# Patient Record
Sex: Female | Born: 1945
Health system: Southern US, Community
[De-identification: ages and names within clinical notes are randomized; demographics above are authoritative.]

## PROBLEM LIST (undated history)

## (undated) DIAGNOSIS — Z923 Personal history of irradiation: Secondary | ICD-10-CM

## (undated) DIAGNOSIS — N879 Dysplasia of cervix uteri, unspecified: Secondary | ICD-10-CM

## (undated) DIAGNOSIS — M1990 Unspecified osteoarthritis, unspecified site: Secondary | ICD-10-CM

## (undated) DIAGNOSIS — C50919 Malignant neoplasm of unspecified site of unspecified female breast: Secondary | ICD-10-CM

## (undated) DIAGNOSIS — F419 Anxiety disorder, unspecified: Secondary | ICD-10-CM

## (undated) DIAGNOSIS — H269 Unspecified cataract: Secondary | ICD-10-CM

## (undated) DIAGNOSIS — Z800 Family history of malignant neoplasm of digestive organs: Secondary | ICD-10-CM

## (undated) DIAGNOSIS — N952 Postmenopausal atrophic vaginitis: Secondary | ICD-10-CM

## (undated) DIAGNOSIS — Z8042 Family history of malignant neoplasm of prostate: Secondary | ICD-10-CM

## (undated) DIAGNOSIS — F32A Depression, unspecified: Secondary | ICD-10-CM

## (undated) DIAGNOSIS — I493 Ventricular premature depolarization: Secondary | ICD-10-CM

## (undated) DIAGNOSIS — Z8489 Family history of other specified conditions: Secondary | ICD-10-CM

## (undated) DIAGNOSIS — F329 Major depressive disorder, single episode, unspecified: Secondary | ICD-10-CM

## (undated) DIAGNOSIS — E7800 Pure hypercholesterolemia, unspecified: Secondary | ICD-10-CM

## (undated) DIAGNOSIS — I499 Cardiac arrhythmia, unspecified: Secondary | ICD-10-CM

## (undated) DIAGNOSIS — C4490 Unspecified malignant neoplasm of skin, unspecified: Secondary | ICD-10-CM

## (undated) DIAGNOSIS — D0510 Intraductal carcinoma in situ of unspecified breast: Secondary | ICD-10-CM

## (undated) DIAGNOSIS — G4700 Insomnia, unspecified: Secondary | ICD-10-CM

## (undated) DIAGNOSIS — Z803 Family history of malignant neoplasm of breast: Secondary | ICD-10-CM

## (undated) DIAGNOSIS — M8580 Other specified disorders of bone density and structure, unspecified site: Secondary | ICD-10-CM

## (undated) DIAGNOSIS — J302 Other seasonal allergic rhinitis: Secondary | ICD-10-CM

## (undated) HISTORY — DX: Family history of malignant neoplasm of breast: Z80.3

## (undated) HISTORY — DX: Intraductal carcinoma in situ of unspecified breast: D05.10

## (undated) HISTORY — DX: Other specified disorders of bone density and structure, unspecified site: M85.80

## (undated) HISTORY — DX: Dysplasia of cervix uteri, unspecified: N87.9

## (undated) HISTORY — DX: Family history of malignant neoplasm of prostate: Z80.42

## (undated) HISTORY — PX: COLONOSCOPY: SHX174

## (undated) HISTORY — DX: Personal history of irradiation: Z92.3

## (undated) HISTORY — PX: BREAST EXCISIONAL BIOPSY: SUR124

## (undated) HISTORY — PX: CATARACT EXTRACTION: SUR2

## (undated) HISTORY — DX: Family history of malignant neoplasm of digestive organs: Z80.0

## (undated) HISTORY — DX: Unspecified cataract: H26.9

## (undated) HISTORY — PX: COLPOSCOPY: SHX161

## (undated) HISTORY — PX: FACIAL COSMETIC SURGERY: SHX629

## (undated) HISTORY — PX: GYNECOLOGIC CRYOSURGERY: SHX857

## (undated) HISTORY — DX: Postmenopausal atrophic vaginitis: N95.2

## (undated) HISTORY — DX: Pure hypercholesterolemia, unspecified: E78.00

---
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
## 1973-11-24 DIAGNOSIS — N879 Dysplasia of cervix uteri, unspecified: Secondary | ICD-10-CM

## 1973-11-24 HISTORY — DX: Dysplasia of cervix uteri, unspecified: N87.9

## 1981-11-24 HISTORY — PX: BREAST LUMPECTOMY: SHX2

## 1998-04-17 ENCOUNTER — Other Ambulatory Visit: Admission: RE | Admit: 1998-04-17 | Discharge: 1998-04-17 | Payer: Self-pay | Admitting: Obstetrics and Gynecology

## 1999-06-07 ENCOUNTER — Other Ambulatory Visit: Admission: RE | Admit: 1999-06-07 | Discharge: 1999-06-07 | Payer: Self-pay | Admitting: Obstetrics and Gynecology

## 2000-07-29 ENCOUNTER — Other Ambulatory Visit: Admission: RE | Admit: 2000-07-29 | Discharge: 2000-07-29 | Payer: Self-pay | Admitting: Obstetrics and Gynecology

## 2001-08-10 ENCOUNTER — Other Ambulatory Visit: Admission: RE | Admit: 2001-08-10 | Discharge: 2001-08-10 | Payer: Self-pay | Admitting: Obstetrics and Gynecology

## 2002-08-10 ENCOUNTER — Other Ambulatory Visit: Admission: RE | Admit: 2002-08-10 | Discharge: 2002-08-10 | Payer: Self-pay | Admitting: Obstetrics and Gynecology

## 2003-08-14 ENCOUNTER — Other Ambulatory Visit: Admission: RE | Admit: 2003-08-14 | Discharge: 2003-08-14 | Payer: Self-pay | Admitting: Obstetrics and Gynecology

## 2003-11-25 HISTORY — PX: OTHER SURGICAL HISTORY: SHX169

## 2004-03-11 ENCOUNTER — Encounter: Admission: RE | Admit: 2004-03-11 | Discharge: 2004-03-11 | Payer: Self-pay | Admitting: Obstetrics and Gynecology

## 2004-08-21 ENCOUNTER — Other Ambulatory Visit: Admission: RE | Admit: 2004-08-21 | Discharge: 2004-08-21 | Payer: Self-pay | Admitting: Obstetrics and Gynecology

## 2004-11-24 DIAGNOSIS — C4490 Unspecified malignant neoplasm of skin, unspecified: Secondary | ICD-10-CM

## 2004-11-24 HISTORY — DX: Unspecified malignant neoplasm of skin, unspecified: C44.90

## 2004-11-24 HISTORY — PX: OTHER SURGICAL HISTORY: SHX169

## 2005-05-07 ENCOUNTER — Encounter: Admission: RE | Admit: 2005-05-07 | Discharge: 2005-05-07 | Payer: Self-pay | Admitting: Obstetrics and Gynecology

## 2005-08-22 ENCOUNTER — Other Ambulatory Visit: Admission: RE | Admit: 2005-08-22 | Discharge: 2005-08-22 | Payer: Self-pay | Admitting: Obstetrics and Gynecology

## 2005-12-17 ENCOUNTER — Ambulatory Visit: Payer: Self-pay | Admitting: Internal Medicine

## 2005-12-30 ENCOUNTER — Encounter (INDEPENDENT_AMBULATORY_CARE_PROVIDER_SITE_OTHER): Payer: Self-pay | Admitting: *Deleted

## 2005-12-30 ENCOUNTER — Ambulatory Visit: Payer: Self-pay | Admitting: Internal Medicine

## 2006-08-13 ENCOUNTER — Encounter: Admission: RE | Admit: 2006-08-13 | Discharge: 2006-08-13 | Payer: Self-pay | Admitting: Obstetrics and Gynecology

## 2006-09-18 ENCOUNTER — Other Ambulatory Visit: Admission: RE | Admit: 2006-09-18 | Discharge: 2006-09-18 | Payer: Self-pay | Admitting: Obstetrics and Gynecology

## 2007-09-20 ENCOUNTER — Other Ambulatory Visit: Admission: RE | Admit: 2007-09-20 | Discharge: 2007-09-20 | Payer: Self-pay | Admitting: Obstetrics and Gynecology

## 2007-11-25 HISTORY — PX: BREAST BIOPSY: SHX20

## 2008-09-26 ENCOUNTER — Encounter: Payer: Self-pay | Admitting: Obstetrics and Gynecology

## 2008-09-26 ENCOUNTER — Ambulatory Visit: Payer: Self-pay | Admitting: Obstetrics and Gynecology

## 2008-09-26 ENCOUNTER — Other Ambulatory Visit: Admission: RE | Admit: 2008-09-26 | Discharge: 2008-09-26 | Payer: Self-pay | Admitting: Obstetrics and Gynecology

## 2008-10-17 ENCOUNTER — Ambulatory Visit: Payer: Self-pay | Admitting: Obstetrics and Gynecology

## 2008-11-24 HISTORY — PX: BREAST BIOPSY: SHX20

## 2008-12-13 ENCOUNTER — Encounter: Admission: RE | Admit: 2008-12-13 | Discharge: 2008-12-13 | Payer: Self-pay | Admitting: Obstetrics and Gynecology

## 2009-01-15 ENCOUNTER — Encounter: Admission: RE | Admit: 2009-01-15 | Discharge: 2009-01-15 | Payer: Self-pay | Admitting: Obstetrics and Gynecology

## 2009-01-18 ENCOUNTER — Encounter: Admission: RE | Admit: 2009-01-18 | Discharge: 2009-01-18 | Payer: Self-pay | Admitting: Obstetrics and Gynecology

## 2009-01-26 ENCOUNTER — Encounter: Admission: RE | Admit: 2009-01-26 | Discharge: 2009-01-26 | Payer: Self-pay | Admitting: Obstetrics and Gynecology

## 2009-01-26 ENCOUNTER — Encounter: Payer: Self-pay | Admitting: Obstetrics and Gynecology

## 2009-09-03 ENCOUNTER — Telehealth: Payer: Self-pay | Admitting: Internal Medicine

## 2009-09-06 DIAGNOSIS — Z860100 Personal history of colon polyps, unspecified: Secondary | ICD-10-CM | POA: Insufficient documentation

## 2009-09-06 DIAGNOSIS — K6289 Other specified diseases of anus and rectum: Secondary | ICD-10-CM | POA: Insufficient documentation

## 2009-09-06 DIAGNOSIS — K625 Hemorrhage of anus and rectum: Secondary | ICD-10-CM | POA: Insufficient documentation

## 2009-09-06 DIAGNOSIS — K5730 Diverticulosis of large intestine without perforation or abscess without bleeding: Secondary | ICD-10-CM | POA: Insufficient documentation

## 2009-09-11 ENCOUNTER — Ambulatory Visit: Payer: Self-pay | Admitting: Internal Medicine

## 2009-09-11 DIAGNOSIS — K602 Anal fissure, unspecified: Secondary | ICD-10-CM | POA: Insufficient documentation

## 2009-09-11 DIAGNOSIS — M129 Arthropathy, unspecified: Secondary | ICD-10-CM | POA: Insufficient documentation

## 2009-09-11 DIAGNOSIS — F411 Generalized anxiety disorder: Secondary | ICD-10-CM | POA: Insufficient documentation

## 2009-09-13 ENCOUNTER — Ambulatory Visit: Payer: Self-pay | Admitting: Internal Medicine

## 2009-10-08 ENCOUNTER — Other Ambulatory Visit: Admission: RE | Admit: 2009-10-08 | Discharge: 2009-10-08 | Payer: Self-pay | Admitting: Obstetrics and Gynecology

## 2009-10-08 ENCOUNTER — Ambulatory Visit: Payer: Self-pay | Admitting: Obstetrics and Gynecology

## 2009-10-08 ENCOUNTER — Encounter: Payer: Self-pay | Admitting: Obstetrics and Gynecology

## 2009-12-14 ENCOUNTER — Encounter: Admission: RE | Admit: 2009-12-14 | Discharge: 2009-12-14 | Payer: Self-pay | Admitting: Obstetrics and Gynecology

## 2009-12-25 ENCOUNTER — Telehealth (INDEPENDENT_AMBULATORY_CARE_PROVIDER_SITE_OTHER): Payer: Self-pay | Admitting: *Deleted

## 2010-01-17 ENCOUNTER — Telehealth (INDEPENDENT_AMBULATORY_CARE_PROVIDER_SITE_OTHER): Payer: Self-pay | Admitting: *Deleted

## 2010-10-31 ENCOUNTER — Ambulatory Visit: Payer: Self-pay | Admitting: Obstetrics and Gynecology

## 2010-11-05 ENCOUNTER — Other Ambulatory Visit
Admission: RE | Admit: 2010-11-05 | Discharge: 2010-11-05 | Payer: Self-pay | Source: Home / Self Care | Admitting: Obstetrics and Gynecology

## 2010-11-05 ENCOUNTER — Ambulatory Visit: Payer: Self-pay | Admitting: Obstetrics and Gynecology

## 2010-12-15 ENCOUNTER — Encounter: Payer: Self-pay | Admitting: Obstetrics and Gynecology

## 2010-12-19 ENCOUNTER — Encounter
Admission: RE | Admit: 2010-12-19 | Discharge: 2010-12-19 | Payer: Self-pay | Source: Home / Self Care | Attending: Obstetrics and Gynecology | Admitting: Obstetrics and Gynecology

## 2010-12-19 ENCOUNTER — Other Ambulatory Visit: Payer: Self-pay | Admitting: Obstetrics and Gynecology

## 2010-12-19 DIAGNOSIS — Z803 Family history of malignant neoplasm of breast: Secondary | ICD-10-CM

## 2010-12-24 NOTE — Assessment & Plan Note (Signed)
Summary: CHANGE IN BOWEL HABITS,OCC.RECTAL PAIN,OCC.RECTAL BLEEDING   ...   History of Present Illness Visit Type: new patient  Primary GI MD: Linda Sar MD Primary Provider: Donnie Mesa, MD  Requesting Provider: n/a Chief Complaint: Change in bowel habits, constipation, hemorrhoids, rectal pain, and BRB when pt wipes after BMs  History of Present Illness:   Linda [name] is a 65 year old female whom we have seen in the past for hematochezia and abdominal pain. A colonoscopy done in 2007 showed diverticulosis and a hyperplastic colonic polyp. Patient does have a family history of colon cancer in a grandparent. She comes today complaining of changes in bowel habits to being more constipated. She also complains of some rectal pain and occasional rectal bleeding. She has not changed any of her medications or her level of activity. Patient walks 3 times a week and stays very active. She is on Metamucil twice a day. She has been taking MiraLax on occasion as well.   GI Review of Systems      Denies abdominal pain, acid reflux, belching, bloating, chest pain, dysphagia with liquids, dysphagia with solids, heartburn, loss of appetite, nausea, vomiting, vomiting blood, weight loss, and  weight gain.      Reports change in bowel habits, constipation, hemorrhoids, irritable bowel syndrome, rectal bleeding, and  rectal pain.     Denies anal fissure, black tarry stools, diarrhea, diverticulosis, fecal incontinence, heme positive stool, jaundice, light color stool, and  liver problems. c   Current Medications (verified): 1)  Cipro 500 Mg Tabs (Ciprofloxacin Hcl) .... One Tablet By Mouth Two Times A Day For One Week 2)  Zocor 20 Mg Tabs (Simvastatin) .... One Tablet By Mouth Once Daily 3)  Evista 60 Mg Tabs (Raloxifene Hcl) .... One Tablet By Mouth Once Daily 4)  Ambien 5 Mg Tabs (Zolpidem Tartrate) .... One Tablet By Mouth Once Daily 5)  Vagifem 25 Mcg Tabs (Estradiol) .... Two Times A Week 6)   Alprazolam 0.25 Mg Tabs (Alprazolam) .... One Tablet By Mouth Two Times Weekly As Needed 7)  Aspir-Low 81 Mg Tbec (Aspirin) .... One Tablet By Mouth Once Daily 8)  Viactiv Multi-Vitamin  Chew (Multiple Vitamins-Calcium) .... Two Tablet By Mouth Daily 9)  Citrucel  Powd (Methylcellulose (Laxative)) .... Two Tablespoons Daily 10)  Fish Oil 1000 Mg Caps (Omega-3 Fatty Acids) .... One Tablet By Mouth Once Daily 11)  Vitamin C 1000 Mg Tabs (Ascorbic Acid) .... One Tablet By Mouth Once Daily 12)  Flax Seed Oil 1000 Mg Caps (Flaxseed (Linseed)) .... One Tablet By Mouth Once Daily  Allergies (verified): No Known Drug Allergies  Past History:  Past Medical History: Current Problems:  ANXIETY (ICD-300.00) ANAL FISSURE (ICD-565.0) ARTHRITIS (ICD-716.90) COLONIC POLYPS, HYPERPLASTIC, HX OF (ICD-V12.72) DIVERTICULOSIS, COLON (ICD-562.10) Family Hx of COLON CANCER (ICD-153.9) RECTAL BLEEDING (ICD-569.3) RECTAL PAIN (XBM-841.32)      Past Surgical History: Reviewed history from 09/06/2009 and no changes required. Appendectomy  Family History: Family History of Colon Cancer: ? Paternal Grandfather Family History of Diabetes: Son Family History of Breast Cancer: Mother and Sister   Social History: Illicit Drug Use - no Occupation: Retired  Married 4 childern Patient has never smoked.  Alcohol Use - yes: one daily  Daily Caffeine Use: one daily  Smoking Status:  never  Review of Systems  The patient denies allergy/sinus, anemia, anxiety-new, arthritis/joint pain, back pain, blood in urine, breast changes/lumps, change in vision, confusion, cough, coughing up blood, depression-new, fainting, fatigue, fever, headaches-new, hearing problems,  heart murmur, heart rhythm changes, itching, menstrual pain, muscle pains/cramps, night sweats, nosebleeds, pregnancy symptoms, shortness of breath, skin rash, sleeping problems, sore throat, swelling of feet/legs, swollen lymph glands, thirst -  excessive , urination - excessive , urination changes/pain, urine leakage, vision changes, and voice change.         Pertinent positive and negative review of systems were noted in the above HPI. All other ROS was otherwise negative.   Vital Signs:  Patient profile:   65 year old female Height:      68 inches Weight:      145 pounds BMI:     22.13 BSA:     1.78 Pulse rate:   68 / minute Pulse rhythm:   regular BP sitting:   110 / 60  (left arm) Cuff size:   regular  Vitals Entered By: Ok Anis CMA (September 11, 2009 8:51 AM)  Physical Exam  General:  Well developed, well nourished, no acute distress. Eyes:  PERRLA, no icterus. Mouth:  No deformity or lesions, dentition normal. Neck:  Supple; no masses or thyromegaly. Lungs:  Clear throughout to auscultation. Heart:  Regular rate and rhythm; no murmurs, rubs,  or bruits. Abdomen:  soft abdomen with normoactive bowel sounds. No scars, no distention, no tenderness Rectal:  rectal and anoscopic exam reveals small first grade internal hemorrhoids without sign of bleeding small external skin tags, somewhat decreased rectal tone, stool is Hemoccult negative Extremities:  No clubbing, cyanosis, edema or deformities noted. Skin:  Intact without significant lesions or rashes.   Impression & Recommendations:  Problem # 1:  RECTAL BLEEDING (ICD-569.3)  Patient has rectal bleeding and discomfort related to small internal hemorrhoids and scar tissue externally from a healed anal fissure. Her stool is Hemoccult negative. There is a family history of colon cancer in a grandparent and the patient is concerned about the possibility of malignancy. We will go ahead with a colonoscopy although the initial recall was planned for 2012.  Orders: Colonoscopy (Colon)  Problem # 2:  COLONIC POLYPS, HYPERPLASTIC, HX OF (ICD-V12.72)  Patient had a hyperplastic polyp on her last colonoscopy.  Orders: Colonoscopy (Colon)  Problem # 3:   DIVERTICULOSIS, COLON (ICD-562.10)  Patient has moderately severe diverticulosis of the left colon which may be causing changes in bowel habits. We will re-evaluate this during her colonoscopy. She is to continue on Metamucil twice a day and a stool softener 1-2 a day. She will also take milk of magnesia 30-45 cc 2-3 times a week. As an alternative, she could use the MiraLax 9-17 g 2-3 times a week.  Orders: Colonoscopy (Colon)  Patient Instructions: 1)  colonoscopy. 2)  Continue physical activity. 3)  Milk of magnesia or MiraLax 2-3 times a week. 4)  Colace 100 mg once or twice a day. 5)  Metamucil one tablsp twice a day. 6)  Copy sent to : Dr Lacretia Nicks.Clelia Croft, Dr D.Gottsegan Prescriptions: DULCOLAX 5 MG  TBEC (BISACODYL) Day before procedure take 2 at 3pm and 2 at 8pm.  #4 x 0   Entered by:   Hortense Ramal CMA (AAMA)   Authorized by:   Hart Carwin MD   Signed by:   Hortense Ramal CMA (AAMA) on 09/11/2009   Method used:   Electronically to        [facility]* (retail)       [address]       [place], [place]  [address]       Ph: [phone]  or [phone]       Fax: [phone]   RxID:   [id number] REGLAN 10 MG  TABS (METOCLOPRAMIDE HCL) As per prep instructions.  #2 x 0   Entered by:   Hortense Ramal CMA (AAMA)   Authorized by:   Hart Carwin MD   Signed by:   Hortense Ramal CMA (AAMA) on 09/11/2009   Method used:   Electronically to        [facility]* (retail)       [address] [place], [place]  [address]       Ph: [phone] or [phone]       Fax: [phone]   RxID:   [id number] MIRALAX   POWD (POLYETHYLENE GLYCOL 3350) As per prep  instructions.  #255gm x 0   Entered by:   Hortense Ramal CMA (AAMA)   Authorized by:   Hart Carwin MD   Signed by:   Hortense Ramal CMA (AAMA) on 09/11/2009   Method used:   Electronically to        [facility]* (retail)       [address]       [place], [place]  [address]       Ph: [phone]  or [phone]       Fax: [phone]   RxID:   [id number]

## 2010-12-24 NOTE — Progress Notes (Signed)
Summary: Records request from [facility]   Request for records received from [facility] Request forwarded to [employer]. Dena Chavis  December 25, 2009 4:22 PM

## 2010-12-24 NOTE — Procedures (Signed)
Summary: COLON   Colonoscopy  Procedure date:  12/30/2005  Findings:      Location:  [facility].    Procedures Next Due Date:    Colonoscopy: 12/2010 Patient Name: Linda, [name] MRN:  Procedure Procedures: Colonoscopy CPT: 01093.    with biopsy. CPT: Q5068410.  Personnel: Endoscopist: Dora L. Juanda Chance, MD.  Exam Location: Exam performed in Outpatient Clinic. Outpatient  Patient Consent: Procedure, Alternatives, Risks and Benefits discussed, consent obtained, from patient. Consent was obtained by the RN.  Indications Symptoms: Hematochezia. Abdominal pain / bloating. Change in bowel habits.  Surveillance of: 2001.  Increased Risk Screening: For family history of colorectal neoplasia, in  grandparent  History  Current Medications: Patient is not currently taking Coumadin.  Pre-Exam Physical: Performed Dec 30, 2005. Entire physical exam was normal.  Comments: Pt. history reviewed/updated, physical exam performed prior to initiation of sedation? Exam Exam: Extent of exam reached: Cecum, extent intended: Cecum.  The cecum was identified by appendiceal orifice and IC valve. Colon retroflexion performed. Images taken. ASA Classification: I. Tolerance: good.  Monitoring: Pulse and BP monitoring, Oximetry used. Supplemental O2 given.  Colon Prep Used Miralax for colon prep. Prep results: good.  Sedation Meds: Patient assessed and found to be appropriate for moderate (conscious) sedation. Fentanyl 100 mcg. given IV. Versed 10 mg. given IV.  Findings - NORMAL EXAM: Cecum.  POLYP: Sigmoid Colon, Maximum size: 3 mm. diminutive, sessile polyp. Distance from Anus 20 cm. Procedure:  biopsy without cautery, removed, retrieved, Polyp sent to pathology. ICD9: Colon Polyps: 211. 3.  - DIVERTICULOSIS: Descending Colon to Sigmoid Colon. ICD9: Diverticulosis: 562.10. Comments: moderately severe.  - NORMAL EXAM: Rectum.   Assessment Abnormal examination, see findings  above.  Diagnoses: 562.10: Diverticulosis.  211.3: Colon Polyps.   Comments: diminutive polyp removed from 20 cm, nothing to account for rectal bleeding which is likely of anal origin Events  Unplanned Interventions: No intervention was required.  Unplanned Events: There were no complications. Plans Medication Plan: Await pathology. Fiber supplements: Psyllium 1 tsp QD, starting Dec 30, 2005  Analpram 2% cream: prn, starting Dec 30, 2005   Patient Education: Patient given standard instructions for: Yearly hemoccult testing recommended. Patient instructed to get routine colonoscopy every 5 years.  Disposition: After procedure patient sent to recovery. After recovery patient sent home.   This report was created from the original endoscopy report, which was reviewed and signed by the above listed endoscopist.     FINAL DIAGNOSIS    ***MICROSCOPIC EXAMINATION AND DIAGNOSIS***    COLON, POLYP(S): HYPERPLASTIC POLYP(S). NO ADENOMATOUS CHANGE   OR MALIGNANCY IDENTIFIED (ENDOSCOPIC BIOPSY, COLON POLYP, 25 CM.    jy   Date Reported: 12/31/2005 Linda L. Almyra Free, MD   *** Electronically Signed Out By ALL ***    Clinical information   Rectal bleeding, abd pain. FX HX colon cancer grandfather. Colon   polyp R/O adenomatous changes (jes)    specimen(s) obtained   Colon, polyp(s) 25 cm    Gross Description   Received in formalin is a tan, soft tissue fragment that is   submitted in toto. Size: 0.4 cm one block   (BJ:jes, 12/31/05)    jes/

## 2010-12-24 NOTE — Miscellaneous (Signed)
Summary: cipro rx  Clinical Lists Changes  Medications: Added new medication of CIPROFLOXACIN HCL 250 MG  TABS (CIPROFLOXACIN HCL) Take 1 twice a day times 10 days - Signed Rx of CIPROFLOXACIN HCL 250 MG  TABS (CIPROFLOXACIN HCL) Take 1 twice a day times 10 days;  #20 x 0;  Signed;  Entered by: Burman Foster RN;  Authorized by: Hart Carwin MD;  Method used: Electronically to [facility] [address] [place], [place]  [address], Ph: [phone] or [phone], Fax: [phone]    Prescriptions: CIPROFLOXACIN HCL 250 MG  TABS (CIPROFLOXACIN HCL) Take 1 twice a day times 10 days  #20 x 0   Entered by:   Burman Foster RN   Authorized by:   Hart Carwin MD   Signed by:   Burman Foster RN on 09/13/2009   Method used:   Electronically to        [facility]* (retail)       [address] [place], [place]  [address]       Ph: [phone] or [phone]       Fax: [phone]   RxID:   [id number]

## 2010-12-24 NOTE — Procedures (Signed)
Summary: Colonoscopy  Patient: [name] Note: All result statuses are Final unless otherwise noted.  Tests: (1) Colonoscopy (COL)   COL Colonoscopy           DONE     [facility]     [address]     [place], [place]  [address]           COLONOSCOPY PROCEDURE REPORT           PATIENT:  Linda [name], Linda [name]  MR#:  [id number]     BIRTHDATE:  March 10, 1946, 63 yrs. old  GENDER:  female           ENDOSCOPIST:  Hedwig Morton. Juanda Chance, MD     Referred by:  Kari Baars, M.D.           PROCEDURE DATE:  09/13/2009     PROCEDURE:  Colonoscopy, Diagnostic     ASA CLASS:  Class I     INDICATIONS:  change in bowel habits, history of hyperplastic     polyps, hematochezia last colon 2007,           MEDICATIONS:   Versed 12 mg, Fentanyl 100 mcg           DESCRIPTION OF PROCEDURE:   After the risks benefits and     alternatives of the procedure were thoroughly explained, informed     consent was obtained.  Digital rectal exam was performed and     revealed no rectal masses.   The LB [id number] endoscope     was introduced through the anus and advanced to the cecum, which     was identified by both the appendix and ileocecal valve, without     limitations.  The quality of the prep was good, using MiraLax.     The instrument was then slowly withdrawn as the colon was fully     examined.     <<PROCEDUREIMAGES>>           FINDINGS:  Moderate diverticulosis was found (see image1, image2,     image7, image8, image10, and image9). narrow tortuous lumen of the     sigmoid colon, whitish purulent appearing material impacted i one     of the diverticuli, r/o diverticulitis  This was otherwise a     normal examination of the colon (see image11, image4, image5, and     image6).   Retroflexed views in the rectum revealed no     abnormalities.    The scope was then withdrawn from the patient     and the procedure completed.           COMPLICATIONS:  None           ENDOSCOPIC IMPRESSION:     1) Moderate diverticulosis     2) Otherwise normal examination     r/o diverticulitis, narrow sigmoid colon     RECOMMENDATIONS:     Cipro 250 mg po bid           REPEAT EXAM:  In 7 year(s) for.           ______________________________     Hedwig Morton. Juanda Chance, MD           CC:           n.     eSIGNED:   Hedwig Morton. Aurilla Coulibaly at 09/13/2009 03:38 PM           Vickii Penna, [phone]  Note: An exclamation mark (!) indicates a  result that was not dispersed into the flowsheet. Document Creation Date: 09/13/2009 3:38 PM _______________________________________________________________________  (1) Order result status: Final Collection or observation date-time: 09/13/2009 15:25 Requested date-time:  Receipt date-time:  Reported date-time:  Referring Physician:   Ordering Physician: Lina Sar [phone]) Specimen Source:  Source: Launa Grill Order Number: [id number] Lab site:   Appended Document: Colonoscopy    Clinical Lists Changes  Observations: Added new observation of COLONNXTDUE: 08/2016 (09/13/2009 15:52)

## 2010-12-24 NOTE — Letter (Signed)
Summary: [facility] Instructions  [facility]  [address] [place], [place] [address]   Phone: [phone]  Fax: [phone]         Linda [name]      February 09, 1946      MRN: [id number]        Procedure Day /Date: 09/13/09 (Thursday)     Arrival Time: 1:30 pm     Procedure Time: 2:30 pm     Location of Procedure:                    _x _  [facility] (4th Floor)    PREPARATION FOR COLONOSCOPY WITH MIRALAX  Starting 5 days prior to your procedure (today) do not eat nuts, seeds, popcorn, corn, beans, peas,  salads, or any raw vegetables.  Do not take any fiber supplements (e.g. Metamucil, Citrucel, and Benefiber). ____________________________________________________________________________________________________   THE DAY BEFORE YOUR PROCEDURE         DATE: 09/12/09 DAY: Wednesday  1   Drink clear liquids the entire day-NO SOLID FOOD  2   Do not drink anything colored red or purple.  Avoid juices with pulp.  No orange juice.  3   Drink at least 64 oz. (8 glasses) of fluid/clear liquids during the day to prevent dehydration and help the prep work efficiently.  CLEAR LIQUIDS INCLUDE: Water Jello Ice Popsicles Tea (sugar ok, no milk/cream) Powdered fruit flavored drinks Coffee (sugar ok, no milk/cream) Gatorade Juice: apple, white grape, white cranberry  Lemonade Clear bullion, consomm, broth Carbonated beverages (any kind) Strained chicken noodle soup Hard Candy   4   Mix the entire bottle of Miralax with 64 oz. of Gatorade/Powerade in the morning and put in the refrigerator to chill.  5   At 3:00 pm take 2 Dulcolax/Bisacodyl tablets.  6   At 4:30 pm take one Reglan/Metoclopramide tablet.  7  Starting at 5:00 pm drink one 8 oz glass of the Miralax mixture every 15-20 minutes until you have finished drinking the entire 64 oz.  You should finish drinking prep around 7:30 or 8:00 pm.  8   If you are nauseated, you may take the 2nd  Reglan/Metoclopramide tablet at 6:30 pm.    THE DAY OF YOUR PROCEDURE      DATE: 09/13/09 DAY: Thursday  You may drink clear liquids until 12:30 pm  (2 HOURS BEFORE PROCEDURE).     MEDICATION INSTRUCTIONS  Unless otherwise instructed, you should take regular prescription medications with a small sip of water as early as possible the morning of your procedure.        OTHER INSTRUCTIONS  You will need a responsible adult at least 65 years of age to accompany you and drive you home.   This person must remain in the waiting room during your procedure.  Wear loose fitting clothing that is easily removed.  Leave jewelry and other valuables at home.  However, you may wish to bring a book to read or an iPod/MP3 player to listen to music as you wait for your procedure to start.  Remove all body piercing jewelry and leave at home.  Total time from sign-in until discharge is approximately 2-3 hours.  You should go home directly after your procedure and rest.  You can resume normal activities the day after your procedure.  The day of your procedure you should not:   Drive   Make legal decisions   Operate machinery   Drink alcohol  Return to work  You will receive specific instructions about eating, activities and medications before you leave.   The above instructions have been reviewed and explained to me by   _______________________    I fully understand and can verbalize these instructions _____________________________ Date _______

## 2010-12-24 NOTE — Progress Notes (Signed)
Summary: TRIAGE  Phone Note Call from Patient Call back at Home Phone [phone] Call back at Work Phone [phone]   Caller: Patient Call For: Dr. Juanda Chance Reason for Call: Talk to Nurse Summary of Call: pt reporting constipation and abd pain off and on for a couple months... pt would be NP3 with hx with Dr. Juanda Chance... pt not oppsed to seeing PA or NP... nothing available with Dr. Juanda Chance Initial call taken by: Vallarie Mare,  September 03, 2009 10:00 AM  Follow-up for Phone Call        Pt. has change in bowel habits, intermittment rectal pain, occ. rectal bleeding, occ. constipation. Would like to bee seen sooner than first available. Pt. will see Dr.Brodie on 09-11-09 at 8:45 AM. Pt. instructed to call back as needed.  Follow-up by: Laureen Ochs LPN,  September 03, 2009 10:55 AM

## 2010-12-24 NOTE — Progress Notes (Signed)
Summary: Records request from [facility]  Request for records received from [facility]. Request forwarded to [employer]. Linda [name]  January 17, 2010 4:40 PM

## 2011-01-10 ENCOUNTER — Ambulatory Visit
Admission: RE | Admit: 2011-01-10 | Discharge: 2011-01-10 | Disposition: A | Discharge status: Home / Self Care | Payer: BC Managed Care – PPO | Source: Ambulatory Visit | Attending: Obstetrics and Gynecology | Admitting: Obstetrics and Gynecology

## 2011-01-10 DIAGNOSIS — Z803 Family history of malignant neoplasm of breast: Secondary | ICD-10-CM

## 2011-01-10 MED ORDER — GADOBENATE DIMEGLUMINE 529 MG/ML IV SOLN
13.0000 mL | Freq: Once | INTRAVENOUS | Status: AC | PRN
  Administered 2011-01-10: 13 mL via INTRAVENOUS

## 2011-03-13 ENCOUNTER — Other Ambulatory Visit: Payer: Self-pay | Admitting: Dermatology

## 2011-11-25 DIAGNOSIS — M8580 Other specified disorders of bone density and structure, unspecified site: Secondary | ICD-10-CM

## 2011-11-25 HISTORY — DX: Other specified disorders of bone density and structure, unspecified site: M85.80

## 2011-11-25 HISTORY — PX: KNEE SURGERY: SHX244

## 2011-11-26 ENCOUNTER — Other Ambulatory Visit: Payer: Self-pay | Admitting: Obstetrics and Gynecology

## 2011-11-26 DIAGNOSIS — E7800 Pure hypercholesterolemia, unspecified: Secondary | ICD-10-CM | POA: Insufficient documentation

## 2011-12-04 ENCOUNTER — Ambulatory Visit (INDEPENDENT_AMBULATORY_CARE_PROVIDER_SITE_OTHER): Payer: Commercial Managed Care - PPO | Admitting: Obstetrics and Gynecology

## 2011-12-04 ENCOUNTER — Other Ambulatory Visit (HOSPITAL_COMMUNITY)
Admission: RE | Admit: 2011-12-04 | Discharge: 2011-12-04 | Disposition: A | Discharge status: Home / Self Care | Payer: Commercial Managed Care - PPO | Source: Ambulatory Visit | Attending: Obstetrics and Gynecology | Admitting: Obstetrics and Gynecology

## 2011-12-04 ENCOUNTER — Encounter: Payer: Self-pay | Admitting: Obstetrics and Gynecology

## 2011-12-04 VITALS — BP 120/74 | Ht 67.0 in | Wt 145.0 lb

## 2011-12-04 DIAGNOSIS — Z01419 Encounter for gynecological examination (general) (routine) without abnormal findings: Secondary | ICD-10-CM

## 2011-12-04 DIAGNOSIS — M949 Disorder of cartilage, unspecified: Secondary | ICD-10-CM

## 2011-12-04 DIAGNOSIS — M899 Disorder of bone, unspecified: Secondary | ICD-10-CM

## 2011-12-04 DIAGNOSIS — M8580 Other specified disorders of bone density and structure, unspecified site: Secondary | ICD-10-CM

## 2011-12-04 LAB — URINALYSIS W MICROSCOPIC + REFLEX CULTURE
Bilirubin Urine: NEGATIVE
Glucose, UA: NEGATIVE mg/dL
Hgb urine dipstick: NEGATIVE
Ketones, ur: NEGATIVE mg/dL
Leukocytes, UA: NEGATIVE
Nitrite: NEGATIVE
Protein, ur: NEGATIVE mg/dL
Specific Gravity, Urine: 1.01 (ref 1.005–1.030)
Urobilinogen, UA: 0.2 mg/dL (ref 0.0–1.0)
pH: 6.5 (ref 5.0–8.0)

## 2011-12-04 MED ORDER — RALOXIFENE HCL 60 MG PO TABS: 60.0000 mg | ORAL_TABLET | Freq: Every day | ORAL | Status: DC

## 2011-12-04 NOTE — Progress Notes (Signed)
Patient came to see me today for her annual GYN exam. We switched her estradiol cream .02% last year and is working better for her atrophic vaginitis then the Vagifem. She is having no vaginal bleeding. She is having no pelvic pain. She remains on Evista without problems. She does this both for breast protection and bone protection. She is due for short-term a bone density followup. Please see last years encounter. She had both a mammogram and MRI last year. She's had to have  2 biopsies due to abnormal MRIs in the past. She does her lab work to her PCP. We discussed if her bone density continue to show worsening adding another drug to her evista.  Physical examination:  Linda [name] present. HEENT within normal limits. Neck: Thyroid not large. No masses. Supraclavicular nodes: not enlarged. Breasts: Examined in both sitting midline position. No skin changes and no masses. Abdomen: Soft no guarding rebound or masses or hernia. Pelvic: External: Within normal limits. BUS: Within normal limits. Vaginal:within normal limits. Good estrogen effect. No evidence of cystocele rectocele or enterocele. Cervix: clean. Uterus: Normal size and shape. Adnexa: No masses. Rectovaginal exam: Confirmatory and negative. Extremities: Within normal limits.  Assessment: #1. Osteopenia #2. Atrophic vaginitis #3. Mother and sister with breast cancer.  Plan: Continue Evista. Continue estradiol vaginal cream. Mammogram. Bone density. Patient will discuss with them the pros and cons of having another MRI of her breasts this year.

## 2011-12-08 ENCOUNTER — Other Ambulatory Visit: Payer: Self-pay | Admitting: Obstetrics and Gynecology

## 2011-12-08 DIAGNOSIS — Z1231 Encounter for screening mammogram for malignant neoplasm of breast: Secondary | ICD-10-CM

## 2011-12-22 ENCOUNTER — Other Ambulatory Visit: Payer: Self-pay | Admitting: *Deleted

## 2011-12-22 MED ORDER — NONFORMULARY OR COMPOUNDED ITEM: Status: DC

## 2011-12-22 NOTE — Telephone Encounter (Signed)
rx called in

## 2011-12-23 ENCOUNTER — Ambulatory Visit (INDEPENDENT_AMBULATORY_CARE_PROVIDER_SITE_OTHER): Payer: Commercial Managed Care - PPO

## 2011-12-23 DIAGNOSIS — M899 Disorder of bone, unspecified: Secondary | ICD-10-CM

## 2011-12-23 DIAGNOSIS — M8580 Other specified disorders of bone density and structure, unspecified site: Secondary | ICD-10-CM

## 2011-12-25 ENCOUNTER — Ambulatory Visit: Payer: Commercial Managed Care - PPO

## 2011-12-29 ENCOUNTER — Other Ambulatory Visit: Payer: Self-pay | Admitting: Obstetrics and Gynecology

## 2012-01-02 ENCOUNTER — Ambulatory Visit
Admission: RE | Admit: 2012-01-02 | Discharge: 2012-01-02 | Disposition: A | Discharge status: Home / Self Care | Payer: Commercial Managed Care - PPO | Source: Ambulatory Visit | Attending: Obstetrics and Gynecology | Admitting: Obstetrics and Gynecology

## 2012-01-02 DIAGNOSIS — Z1231 Encounter for screening mammogram for malignant neoplasm of breast: Secondary | ICD-10-CM

## 2012-01-08 ENCOUNTER — Other Ambulatory Visit: Payer: Self-pay | Admitting: *Deleted

## 2012-01-08 DIAGNOSIS — N63 Unspecified lump in unspecified breast: Secondary | ICD-10-CM

## 2012-01-13 ENCOUNTER — Ambulatory Visit
Admission: RE | Admit: 2012-01-13 | Discharge: 2012-01-13 | Disposition: A | Discharge status: Home / Self Care | Payer: Commercial Managed Care - PPO | Source: Ambulatory Visit | Attending: Obstetrics and Gynecology | Admitting: Obstetrics and Gynecology

## 2012-01-13 DIAGNOSIS — N63 Unspecified lump in unspecified breast: Secondary | ICD-10-CM

## 2012-01-16 ENCOUNTER — Ambulatory Visit (INDEPENDENT_AMBULATORY_CARE_PROVIDER_SITE_OTHER): Payer: Commercial Managed Care - PPO | Admitting: Obstetrics and Gynecology

## 2012-01-16 DIAGNOSIS — M899 Disorder of bone, unspecified: Secondary | ICD-10-CM

## 2012-01-16 DIAGNOSIS — M8580 Other specified disorders of bone density and structure, unspecified site: Secondary | ICD-10-CM

## 2012-01-16 DIAGNOSIS — M949 Disorder of cartilage, unspecified: Secondary | ICD-10-CM

## 2012-01-16 NOTE — Progress Notes (Signed)
Patient came back to see me today to discuss her bone density. Her history is that 2005 to 2008 she took either Fosamax or Boniva. She had no problem with either one. In 2008 we switched her to Evista to give her added  Protection against breast cancer which she has a strong family history. In 2011 bone density showed significant loss of bone in her spine with her FRAX risk of 18%/1.4%. Due to that elevated risk we did a followup bone density one year later which is current. Her current bone density showed her worst T score to be used -1.7. Her spine was now stable and improved. She did have additional bone loss in her right hip. Although FRAX risks are not applicable to people on medication one was done which showed 39%/3.3%. The reasons it was so much more elevated this time was we counted her previous fractures of her toes as a yes to the question even though some of them dated 2 before she was 66 years old. The other yes to questions was use of steroids. She uses Flonase for approximately one year but is not currently on it and doesn't feel she needs to go back on it. Her father did have a hip fracture when he was 66 years old.  We had a 30 minute discussion trying to put all the above in clinical terms for her. She understands that use of the fracture risk is not absolutely accurate at this point. We discussed pros and cons of adding a biphosphonate to her evista. We both feel this is of marginal benefit. She will continue evista and we will recheck bone density in 2 years.

## 2012-02-05 ENCOUNTER — Telehealth: Payer: Self-pay | Admitting: *Deleted

## 2012-02-05 NOTE — Telephone Encounter (Signed)
Pt called requesting order for breast MRI due to family history of breast cancer. Order placed on chart.

## 2012-02-05 NOTE — Telephone Encounter (Signed)
Left message with Olegario Messier at breast center to call regarding the below note. Pt called requesting order for annual MRI of breast.

## 2012-02-11 ENCOUNTER — Other Ambulatory Visit: Payer: Self-pay | Admitting: Obstetrics and Gynecology

## 2012-02-11 DIAGNOSIS — Z803 Family history of malignant neoplasm of breast: Secondary | ICD-10-CM

## 2012-03-08 ENCOUNTER — Ambulatory Visit
Admission: RE | Admit: 2012-03-08 | Discharge: 2012-03-08 | Disposition: A | Discharge status: Home / Self Care | Payer: Commercial Managed Care - PPO | Source: Ambulatory Visit | Attending: Obstetrics and Gynecology | Admitting: Obstetrics and Gynecology

## 2012-03-08 DIAGNOSIS — Z803 Family history of malignant neoplasm of breast: Secondary | ICD-10-CM

## 2012-03-08 MED ORDER — GADOBENATE DIMEGLUMINE 529 MG/ML IV SOLN
14.0000 mL | Freq: Once | INTRAVENOUS | Status: AC | PRN
  Administered 2012-03-08: 14 mL via INTRAVENOUS

## 2012-05-05 ENCOUNTER — Other Ambulatory Visit: Payer: Self-pay | Admitting: Obstetrics and Gynecology

## 2012-06-09 ENCOUNTER — Other Ambulatory Visit (HOSPITAL_COMMUNITY): Payer: Self-pay | Admitting: Orthopedic Surgery

## 2012-06-09 DIAGNOSIS — M25561 Pain in right knee: Secondary | ICD-10-CM

## 2012-06-14 ENCOUNTER — Ambulatory Visit (HOSPITAL_COMMUNITY)
Admission: RE | Admit: 2012-06-14 | Discharge: 2012-06-14 | Disposition: A | Discharge status: Home / Self Care | Payer: Commercial Managed Care - PPO | Source: Ambulatory Visit | Attending: Orthopedic Surgery | Admitting: Orthopedic Surgery

## 2012-06-14 DIAGNOSIS — X58XXXA Exposure to other specified factors, initial encounter: Secondary | ICD-10-CM | POA: Insufficient documentation

## 2012-06-14 DIAGNOSIS — IMO0002 Reserved for concepts with insufficient information to code with codable children: Secondary | ICD-10-CM | POA: Insufficient documentation

## 2012-06-14 DIAGNOSIS — R262 Difficulty in walking, not elsewhere classified: Secondary | ICD-10-CM | POA: Insufficient documentation

## 2012-06-14 DIAGNOSIS — M2240 Chondromalacia patellae, unspecified knee: Secondary | ICD-10-CM | POA: Insufficient documentation

## 2012-06-14 DIAGNOSIS — M1710 Unilateral primary osteoarthritis, unspecified knee: Secondary | ICD-10-CM | POA: Insufficient documentation

## 2012-06-14 DIAGNOSIS — M25569 Pain in unspecified knee: Secondary | ICD-10-CM | POA: Insufficient documentation

## 2012-06-14 DIAGNOSIS — M25561 Pain in right knee: Secondary | ICD-10-CM

## 2012-06-14 DIAGNOSIS — M7120 Synovial cyst of popliteal space [Baker], unspecified knee: Secondary | ICD-10-CM | POA: Insufficient documentation

## 2012-07-05 ENCOUNTER — Telehealth: Payer: Self-pay | Admitting: *Deleted

## 2012-07-05 NOTE — Telephone Encounter (Signed)
Your note to me said n mutations. She did not have mutations of BRAC-1 or 2. The estrogen patch has pros and cons. Suggest office visit to discuss this and her family risk. This is per Dr.G staff message, pt informed and will make office visit.

## 2012-07-05 NOTE — Telephone Encounter (Signed)
Pt has two question: 1. If you think she would a candidate for estradiol patch? Pt has been doing research and show that it could do some good to the bones pt has osteopenia and thought maybe this would help?   2. She was tested back Nov./2005 for BRCA 1 & BRCA2  And was told it showed n mutations detected. Pt asked if she should be worried about her daughter and granddaughters due to family history of breast cancer.

## 2012-07-05 NOTE — Telephone Encounter (Signed)
chart

## 2012-11-11 ENCOUNTER — Other Ambulatory Visit: Payer: Self-pay | Admitting: Obstetrics and Gynecology

## 2012-11-24 HISTORY — PX: FACELIFT: SHX1566

## 2012-12-16 ENCOUNTER — Other Ambulatory Visit: Payer: Self-pay | Admitting: Gynecology

## 2012-12-16 ENCOUNTER — Other Ambulatory Visit: Payer: Self-pay | Admitting: Internal Medicine

## 2012-12-16 DIAGNOSIS — Z1231 Encounter for screening mammogram for malignant neoplasm of breast: Secondary | ICD-10-CM

## 2012-12-20 ENCOUNTER — Other Ambulatory Visit: Payer: Self-pay | Admitting: Obstetrics and Gynecology

## 2012-12-20 NOTE — Telephone Encounter (Signed)
Has CE scheduled with Dr. Velvet Bathe for 12/22/12.

## 2012-12-22 ENCOUNTER — Ambulatory Visit (INDEPENDENT_AMBULATORY_CARE_PROVIDER_SITE_OTHER): Payer: Commercial Managed Care - PPO | Admitting: Gynecology

## 2012-12-22 ENCOUNTER — Encounter: Payer: Self-pay | Admitting: Gynecology

## 2012-12-22 VITALS — BP 124/74 | Ht 68.0 in | Wt 149.0 lb

## 2012-12-22 DIAGNOSIS — M949 Disorder of cartilage, unspecified: Secondary | ICD-10-CM

## 2012-12-22 DIAGNOSIS — M8580 Other specified disorders of bone density and structure, unspecified site: Secondary | ICD-10-CM

## 2012-12-22 DIAGNOSIS — N952 Postmenopausal atrophic vaginitis: Secondary | ICD-10-CM

## 2012-12-22 DIAGNOSIS — N879 Dysplasia of cervix uteri, unspecified: Secondary | ICD-10-CM | POA: Insufficient documentation

## 2012-12-22 DIAGNOSIS — Z01419 Encounter for gynecological examination (general) (routine) without abnormal findings: Secondary | ICD-10-CM

## 2012-12-22 DIAGNOSIS — M899 Disorder of bone, unspecified: Secondary | ICD-10-CM

## 2012-12-22 MED ORDER — RALOXIFENE HCL 60 MG PO TABS: 60.0000 mg | ORAL_TABLET | Freq: Every day | ORAL | Status: DC

## 2012-12-22 MED ORDER — NONFORMULARY OR COMPOUNDED ITEM: Status: DC

## 2012-12-22 NOTE — Progress Notes (Signed)
Linda [name] 1946-03-11 [id number]        67 y.o.  [id number] for annual exam.  Former patient Dr. Verl [name]. Several issues noted below.  Past medical history,surgical history, medications, allergies, family history and social history were all reviewed and documented in the EPIC chart. ROS:  Was performed and pertinent positives and negatives are included in the history.  Exam: Linda [name] assistant Filed Vitals:   12/22/12 0900  BP: 124/74  Height: 5\' 8"  (1.727 m)  Weight: 149 lb (67.586 kg)   General appearance  Normal Skin grossly normal Head/Neck normal with no cervical or supraclavicular adenopathy thyroid normal Lungs  clear Cardiac RR, without RMG Abdominal  soft, nontender, without masses, organomegaly or hernia Breasts  examined lying and sitting without masses, retractions, discharge or axillary adenopathy. Pelvic  Ext/BUS/vagina  normal with atrophic changes  Cervix  normal with atrophic changes  Uterus  anteverted, normal size, shape and contour, midline and mobile nontender   Adnexa  Without masses or tenderness    Anus and perineum  normal   Rectovaginal  normal sphincter tone without palpated masses or tenderness.    Assessment/Plan:  67 y.o. [id number] female for annual exam.   1. Family history of breast cancer. Patient's mother developed breast cancer in her 35s. Patient's sister developed breast cancer at age 39. The patient was BRCA gene tested and negative. Her sister with breast cancer was never tested. She is on Evista. Had MRI of the breast last year. Patient questioned whether to continue with Evista and whether to continue with annual MRIs. I reviewed her family history, negative gene testing and issue that she could have a genetic link not detected on gene testing. After very lengthy discussion we both agree on holding MRI this year and doing next year in every other year interval. Recommended 3-D mammography now and continue on Evista. I reviewed the risks  benefits to include possible increased risk of thrombosis such as stroke heart attack DVT. I refilled her Evista x1 year. 2. Postmenopausal atrophic vaginitis. Patient is having issues with vaginal dryness and uses estradiol cream 3 times weekly. I reviewed the issue of endometrial stimulation and absorption. Unlikely with vaginal estrogen cream. Alternatives to include Vagifem discussed. Option for endometrial echo assessment with ultrasound discussed. Patient wants to go ahead and do this and we'll schedule. Otherwise she wants to continue on her estrogen cream and I refilled her times per year. I discussed the issues of absorption, WHI study with increased risk of stroke heart attack DVT and breast cancer possibilities. 3. Osteopenia. DEXA 11/2011 with T score -1.7. Had been on bisphosphonates 2005 through 2008 and then started on Evista in 2008. Increase calcium vitamin D reviewed. Recommend repeat DEXA next year-to-year interval. 4. Pap smear 2013. No Pap smear done today.  History of dysplasia with cryo-surgery 1975 with normal Pap smears since then. Options to stop screening altogether she is over the age of 66 versus less frequent screening reviewed and we'll readdress on an annual basis. 5. Colonoscopy 2010. Repeat at their recommended interval. 6. Health maintenance. No lab work done as it is all done through her primary physician's office who she sees on a regular basis. Follow up for her ultrasound otherwise annually.    Linda Lords MD, 9:51 AM 12/22/2012

## 2012-12-22 NOTE — Patient Instructions (Signed)
Follow up for ultrasound as scheduled 

## 2013-01-17 ENCOUNTER — Ambulatory Visit (INDEPENDENT_AMBULATORY_CARE_PROVIDER_SITE_OTHER): Payer: Commercial Managed Care - PPO

## 2013-01-17 ENCOUNTER — Ambulatory Visit (INDEPENDENT_AMBULATORY_CARE_PROVIDER_SITE_OTHER): Payer: Commercial Managed Care - PPO | Admitting: Gynecology

## 2013-01-17 ENCOUNTER — Encounter: Payer: Self-pay | Admitting: Gynecology

## 2013-01-17 DIAGNOSIS — N952 Postmenopausal atrophic vaginitis: Secondary | ICD-10-CM

## 2013-01-17 DIAGNOSIS — R9389 Abnormal findings on diagnostic imaging of other specified body structures: Secondary | ICD-10-CM

## 2013-01-17 DIAGNOSIS — N8500 Endometrial hyperplasia, unspecified: Secondary | ICD-10-CM

## 2013-01-17 DIAGNOSIS — N83339 Acquired atrophy of ovary and fallopian tube, unspecified side: Secondary | ICD-10-CM

## 2013-01-17 DIAGNOSIS — N8300 Follicular cyst of ovary, unspecified side: Secondary | ICD-10-CM

## 2013-01-17 NOTE — Progress Notes (Signed)
Patient presents for ultrasound. She has been on estradiol vaginal cream for atrophic vaginitis symptoms. We had discussed previously endometrial assessment that she ultimately wanted to go ahead and get a baseline ultrasound. She is having no symptoms such as vaginal bleeding. She is also on Evista.  Ultrasound today shows uterus normal size and echotexture. Endometrial echo 5.1 mm. Right and left ovaries visualized and atrophic. Cul-de-sac negative.  Assessment and plan: Patient on vaginal estrogen. Endometrial echo 5.1 mm. I reviewed with her this is a marginal number, 4 or less desirable. Options to endometrial sample now recognizing again atrophic cervix and possible problems doing so or inadequate results versus continued observation in the absence of bleeding and repeat ultrasound in one year. Patient's comfortable with repeat ultrasound in one year which I think is reasonable. If she does any bleeding she knows to call me.  We again discussed options for atrophic vaginitis to include OTC moisturizers, Estrace cream, Vagifem and Osphena. She is on Evista and I don't feel comfortable using 2 SERMs simultaneously. At this point patients can try the moisturizers and see if this doesn't help or continue with the vaginal estrogen at her choice.

## 2013-01-17 NOTE — Patient Instructions (Signed)
Follow up in one year for annual exam and repeat ultrasound 

## 2013-01-19 ENCOUNTER — Telehealth: Payer: Self-pay | Admitting: Gynecology

## 2013-01-19 NOTE — Telephone Encounter (Signed)
Informed pt with the below note and she asked to speak with you. Call back # [phone]

## 2013-01-19 NOTE — Telephone Encounter (Signed)
Tell patient I was reviewing her case and my preference would be to proceed with sonohysterogram now for better definition of the endometrial cavity. Since the measurement was in the limbo range I think we'll be facing the same discussion next year on repeat ultrasound as she is using the vaginal estrogen and also on Evista.  I think it would be prudent to go ahead and look at the endometrial cavity now with the sonohysterogram for better definition just to clear the cavity. Schedule her for the sonohysterogram. If she has any questions I'll be glad to talk to her.

## 2013-01-19 NOTE — Telephone Encounter (Signed)
Left message for pt to call.

## 2013-01-20 NOTE — Telephone Encounter (Signed)
Linda [name] will have this scheduled for pt.

## 2013-01-20 NOTE — Telephone Encounter (Signed)
Called patient and discussed the sonohysterogram. I reviewed with her that I really do not think that there is anything going on but given the marginal endometrial echo I think that we'll be revisiting this next year with the same issue and that we should just go ahead and look at her cavity now with sonohysterogram and she agrees with this. We will go ahead and schedule sonohysterogram for her.

## 2013-01-24 ENCOUNTER — Other Ambulatory Visit: Payer: Self-pay | Admitting: Gynecology

## 2013-01-24 DIAGNOSIS — N83339 Acquired atrophy of ovary and fallopian tube, unspecified side: Secondary | ICD-10-CM

## 2013-01-24 DIAGNOSIS — N952 Postmenopausal atrophic vaginitis: Secondary | ICD-10-CM

## 2013-01-24 DIAGNOSIS — N950 Postmenopausal bleeding: Secondary | ICD-10-CM

## 2013-01-25 ENCOUNTER — Ambulatory Visit
Admission: RE | Admit: 2013-01-25 | Discharge: 2013-01-25 | Disposition: A | Discharge status: Home / Self Care | Payer: Commercial Managed Care - PPO | Source: Ambulatory Visit | Attending: Gynecology | Admitting: Gynecology

## 2013-01-25 DIAGNOSIS — Z1231 Encounter for screening mammogram for malignant neoplasm of breast: Secondary | ICD-10-CM

## 2013-01-31 ENCOUNTER — Ambulatory Visit (INDEPENDENT_AMBULATORY_CARE_PROVIDER_SITE_OTHER): Payer: Commercial Managed Care - PPO

## 2013-01-31 ENCOUNTER — Ambulatory Visit (INDEPENDENT_AMBULATORY_CARE_PROVIDER_SITE_OTHER): Payer: Commercial Managed Care - PPO | Admitting: Gynecology

## 2013-01-31 ENCOUNTER — Encounter: Payer: Self-pay | Admitting: Gynecology

## 2013-01-31 ENCOUNTER — Other Ambulatory Visit: Payer: Self-pay | Admitting: Gynecology

## 2013-01-31 DIAGNOSIS — N882 Stricture and stenosis of cervix uteri: Secondary | ICD-10-CM

## 2013-01-31 DIAGNOSIS — N950 Postmenopausal bleeding: Secondary | ICD-10-CM

## 2013-01-31 DIAGNOSIS — N952 Postmenopausal atrophic vaginitis: Secondary | ICD-10-CM

## 2013-01-31 DIAGNOSIS — R9389 Abnormal findings on diagnostic imaging of other specified body structures: Secondary | ICD-10-CM

## 2013-01-31 DIAGNOSIS — R102 Pelvic and perineal pain: Secondary | ICD-10-CM

## 2013-01-31 DIAGNOSIS — N949 Unspecified condition associated with female genital organs and menstrual cycle: Secondary | ICD-10-CM

## 2013-01-31 MED ORDER — LIDOCAINE HCL 1 % IJ SOLN
10.0000 mL | Freq: Once | INTRAMUSCULAR | Status: AC
  Administered 2013-01-31: 10 mL

## 2013-01-31 NOTE — Patient Instructions (Signed)
Office will call you with biopsy results 

## 2013-01-31 NOTE — Progress Notes (Signed)
Patient presents for sonohysterogram due to generous endometrial echo. On vaginal estrogen cream chronically.  Ultrasound again confirms endometrial echo is 5.1 mm. I reviewed with patient on the telephone as well as again today that this more than likely is totally normal but crosses the 4-5 mm limit. Options to repeat ultrasound in a year versus stone history and discussed and we both agree to proceed with sonohysterogram.  Cervix was visualized with a speculum, cleansed with Betadine and initial attempts to pass the sonohysterogram catheter was unsuccessful due to cervical stenosis. A paracervical block using 1% lidocaine, total of 8 cc was placed, anterior lip of the cervix grasped with a single-tooth tenaculum and the cervix was gently dilated under abdominal ultrasound guidance to allow placement of the catheter which appeared clearly within the endometrium. There was resistance to distention with very little distention and an endometrial sample was taken with scant return consistent with an atrophic pattern.  Assessment and plan: Atrophic pattern. Suspect biopsy will return inadequate which in this circumstance is fine and I reviewed this with the patient. She will continue with her vaginal estrogen as needed for her atrophic vaginitis and followup in 1 year.  If biopsy otherwise abnormal then we'll triaged based on results.

## 2013-05-19 DIAGNOSIS — G4700 Insomnia, unspecified: Secondary | ICD-10-CM | POA: Insufficient documentation

## 2013-06-29 ENCOUNTER — Other Ambulatory Visit: Payer: Self-pay

## 2013-09-29 ENCOUNTER — Other Ambulatory Visit: Payer: Self-pay

## 2013-11-24 DIAGNOSIS — Z923 Personal history of irradiation: Secondary | ICD-10-CM

## 2013-11-24 HISTORY — DX: Personal history of irradiation: Z92.3

## 2013-11-24 HISTORY — PX: BREAST LUMPECTOMY: SHX2

## 2013-12-22 ENCOUNTER — Other Ambulatory Visit: Payer: Self-pay

## 2013-12-22 DIAGNOSIS — Z1231 Encounter for screening mammogram for malignant neoplasm of breast: Secondary | ICD-10-CM

## 2013-12-22 DIAGNOSIS — Z803 Family history of malignant neoplasm of breast: Secondary | ICD-10-CM

## 2014-01-03 DIAGNOSIS — N952 Postmenopausal atrophic vaginitis: Secondary | ICD-10-CM | POA: Insufficient documentation

## 2014-01-03 DIAGNOSIS — K5900 Constipation, unspecified: Secondary | ICD-10-CM | POA: Insufficient documentation

## 2014-01-26 ENCOUNTER — Ambulatory Visit
Admission: RE | Admit: 2014-01-26 | Discharge: 2014-01-26 | Disposition: A | Discharge status: Home / Self Care | Payer: Self-pay | Source: Ambulatory Visit

## 2014-01-26 ENCOUNTER — Other Ambulatory Visit: Payer: Self-pay

## 2014-01-26 DIAGNOSIS — Z1231 Encounter for screening mammogram for malignant neoplasm of breast: Secondary | ICD-10-CM

## 2014-01-26 DIAGNOSIS — Z803 Family history of malignant neoplasm of breast: Secondary | ICD-10-CM

## 2014-01-27 ENCOUNTER — Other Ambulatory Visit: Payer: Self-pay | Admitting: Obstetrics and Gynecology

## 2014-01-27 DIAGNOSIS — Z803 Family history of malignant neoplasm of breast: Secondary | ICD-10-CM

## 2014-02-03 ENCOUNTER — Ambulatory Visit
Admission: RE | Admit: 2014-02-03 | Discharge: 2014-02-03 | Disposition: A | Discharge status: Home / Self Care | Payer: Commercial Managed Care - PPO | Source: Ambulatory Visit | Attending: Obstetrics and Gynecology | Admitting: Obstetrics and Gynecology

## 2014-02-03 DIAGNOSIS — Z803 Family history of malignant neoplasm of breast: Secondary | ICD-10-CM

## 2014-02-03 MED ORDER — GADOBENATE DIMEGLUMINE 529 MG/ML IV SOLN
14.0000 mL | Freq: Once | INTRAVENOUS | Status: AC | PRN
  Administered 2014-02-03: 14 mL via INTRAVENOUS

## 2014-02-10 ENCOUNTER — Other Ambulatory Visit (HOSPITAL_COMMUNITY): Payer: Self-pay | Admitting: Orthopedic Surgery

## 2014-02-10 DIAGNOSIS — M25562 Pain in left knee: Secondary | ICD-10-CM

## 2014-02-16 DIAGNOSIS — R87618 Other abnormal cytological findings on specimens from cervix uteri: Secondary | ICD-10-CM | POA: Insufficient documentation

## 2014-02-16 DIAGNOSIS — IMO0001 Reserved for inherently not codable concepts without codable children: Secondary | ICD-10-CM | POA: Insufficient documentation

## 2014-02-16 DIAGNOSIS — R896 Abnormal cytological findings in specimens from other organs, systems and tissues: Secondary | ICD-10-CM

## 2014-02-20 ENCOUNTER — Ambulatory Visit (HOSPITAL_COMMUNITY): Payer: Commercial Managed Care - PPO

## 2014-03-01 ENCOUNTER — Ambulatory Visit (HOSPITAL_COMMUNITY): Payer: Commercial Managed Care - PPO

## 2014-03-06 ENCOUNTER — Ambulatory Visit (HOSPITAL_COMMUNITY)
Admission: RE | Admit: 2014-03-06 | Discharge: 2014-03-06 | Disposition: A | Discharge status: Home / Self Care | Payer: Commercial Managed Care - PPO | Source: Ambulatory Visit | Attending: Orthopedic Surgery | Admitting: Orthopedic Surgery

## 2014-03-06 DIAGNOSIS — M25569 Pain in unspecified knee: Secondary | ICD-10-CM | POA: Insufficient documentation

## 2014-03-06 DIAGNOSIS — M25562 Pain in left knee: Secondary | ICD-10-CM

## 2014-03-07 DIAGNOSIS — R928 Other abnormal and inconclusive findings on diagnostic imaging of breast: Secondary | ICD-10-CM | POA: Insufficient documentation

## 2014-04-03 ENCOUNTER — Other Ambulatory Visit: Payer: Self-pay | Admitting: Dermatology

## 2014-04-19 ENCOUNTER — Other Ambulatory Visit (INDEPENDENT_AMBULATORY_CARE_PROVIDER_SITE_OTHER): Payer: Self-pay | Admitting: General Surgery

## 2014-04-25 ENCOUNTER — Other Ambulatory Visit: Payer: Self-pay | Admitting: Gynecology

## 2014-05-03 DIAGNOSIS — D0511 Intraductal carcinoma in situ of right breast: Secondary | ICD-10-CM | POA: Insufficient documentation

## 2014-05-18 ENCOUNTER — Telehealth (INDEPENDENT_AMBULATORY_CARE_PROVIDER_SITE_OTHER): Payer: Self-pay

## 2014-05-18 NOTE — Telephone Encounter (Signed)
Called pt to make an appt to see Dr Donne Hazel on 05/25/14 arrive at 2:00/2:30. All of her records are on epic in care everywhere that the pt saw the surgeon Dr Elisha Headland at [facility]. I did request our pathology Dept. To request the MRI guided bx path slides to be sent to Dr Lyndon Code for review per Dr Donne Hazel. The pt understands.

## 2014-05-19 ENCOUNTER — Ambulatory Visit
Admission: RE | Admit: 2014-05-19 | Discharge: 2014-05-19 | Disposition: A | Discharge status: Home / Self Care | Payer: Commercial Managed Care - PPO | Source: Ambulatory Visit | Attending: Radiation Oncology | Admitting: Radiation Oncology

## 2014-05-19 ENCOUNTER — Encounter: Payer: Self-pay | Admitting: Radiation Oncology

## 2014-05-19 VITALS — BP 117/80 | HR 68 | Temp 98.0°F | Resp 20 | Ht 68.0 in | Wt 151.8 lb

## 2014-05-19 DIAGNOSIS — M899 Disorder of bone, unspecified: Secondary | ICD-10-CM | POA: Diagnosis not present

## 2014-05-19 DIAGNOSIS — Z170 Estrogen receptor positive status [ER+]: Secondary | ICD-10-CM | POA: Diagnosis not present

## 2014-05-19 DIAGNOSIS — E7800 Pure hypercholesterolemia, unspecified: Secondary | ICD-10-CM | POA: Diagnosis not present

## 2014-05-19 DIAGNOSIS — D0590 Unspecified type of carcinoma in situ of unspecified breast: Secondary | ICD-10-CM | POA: Insufficient documentation

## 2014-05-19 DIAGNOSIS — Z510 Encounter for antineoplastic radiation therapy: Secondary | ICD-10-CM | POA: Insufficient documentation

## 2014-05-19 DIAGNOSIS — G4700 Insomnia, unspecified: Secondary | ICD-10-CM | POA: Diagnosis not present

## 2014-05-19 DIAGNOSIS — C50511 Malignant neoplasm of lower-outer quadrant of right female breast: Secondary | ICD-10-CM

## 2014-05-19 DIAGNOSIS — Z87891 Personal history of nicotine dependence: Secondary | ICD-10-CM | POA: Diagnosis not present

## 2014-05-19 DIAGNOSIS — C50311 Malignant neoplasm of lower-inner quadrant of right female breast: Secondary | ICD-10-CM | POA: Insufficient documentation

## 2014-05-19 DIAGNOSIS — Z803 Family history of malignant neoplasm of breast: Secondary | ICD-10-CM | POA: Insufficient documentation

## 2014-05-19 DIAGNOSIS — Z9089 Acquired absence of other organs: Secondary | ICD-10-CM | POA: Diagnosis not present

## 2014-05-19 DIAGNOSIS — F411 Generalized anxiety disorder: Secondary | ICD-10-CM | POA: Insufficient documentation

## 2014-05-19 DIAGNOSIS — M949 Disorder of cartilage, unspecified: Secondary | ICD-10-CM

## 2014-05-19 HISTORY — DX: Anxiety disorder, unspecified: F41.9

## 2014-05-19 HISTORY — DX: Unspecified malignant neoplasm of skin, unspecified: C44.90

## 2014-05-19 HISTORY — DX: Malignant neoplasm of unspecified site of unspecified female breast: C50.919

## 2014-05-19 HISTORY — DX: Insomnia, unspecified: G47.00

## 2014-05-19 HISTORY — DX: Unspecified osteoarthritis, unspecified site: M19.90

## 2014-05-19 NOTE — Progress Notes (Signed)
Location of Breast Cancer: right breast  Histology per Pathology Report:   Surgical Pathology Tissue Exam5/27/2015  [facility]       Result Narrative   ACCESSION NUMBER:  [id number] RECEIVED: 04/19/2014 ORDERING PHYSICIAN:  Angelina Ok , MD PATIENT NAME:  [name], Linda SURGICAL PATHOLOGY REPORT  FINAL PATHOLOGIC DIAGNOSIS MICROSCOPIC EXAMINATION PERFORMED AND SUPPORTS DIAGNOSIS  RIGHT BREAST, NEEDLE CORE BIOPSY:      Focal ductal carcinoma in situ, micropapillary and apocrine types.      Sclerosing papilloma.      Calcifications present.  COMMENT: The tumor appears low grade in the biopsy. Correlation with the resection specimen is recommended. Hormone receptor studies will be ordered and reported separately.  I have personally reviewed the slides and/or other related materials referenced, and have edited the report as part of my pathologic assessment and final interpretation.   Specimen(s) Received  Right breast biopsy   Clinical History Right breast biopsy. The specimen was removed at 9:15 and put in formalin at 9:20 on 04/19/14. Nonpalpable 15 mm mass with intermediate suspicion for carcinoma.  Gross Description Received labeled  with patient name, MRN and other identification details are 15 core needle biopsies ranging from 1 cm to 2.7 cm long, each with a diameter measuring up to 0.3 cm. The specimen is entirely submitted in A1-A5.  Harrell Lark, M.D.,  [place]             Date Ordered: 04/26/2014        Date Reported: 04/26/2014 Interpretation Prognostic Markers in Breast Cancer  Clinical Information  History:  Ductal carcinoma in situ of the breast Pathologist:  Dr. Claybon Jabs Specimen Number:  [id number]; block A4 Assay                    Results  Estrogen Receptor               Positive (70%, strong intensity) Progesterone Receptor          Positive (80%, strong intensity)  COMMENT: The  positive and negative immunohistochemical controls stained appropriately.  Fixation time is in compliance with CAP/ASCO guidelines.      Receptor Status: ER(70%), PR (80%), Her2-neu ()  Did patient present with symptoms (if so, please note symptoms) or was this found on screening mammography?: breast MRI following screening mammogram  Past/Anticipated interventions by surgeon, if any: Dr Rayburn Go, [facility]. No surgery scheduled at this time.   Past/Anticipated interventions by medical oncology, if any: Chemotherapy , no appointment scheduled at this time  Lymphedema issues, if any:   no  Pain issues, if any:  no  SAFETY ISSUES:  Prior radiation? no  Pacemaker/ICD? no  Possible current pregnancy? no  Is the patient on methotrexate? no  Current Complaints / other details:  Married, 7 grandchildren, 1 grandchild on the way    Andria Rhein, [place] 05/19/2014,8:08 AM

## 2014-05-19 NOTE — Progress Notes (Signed)
[facility] NEW PATIENT EVALUATION  Name: Linda [name] MRN: [id number]  Date:   05/19/2014           DOB: 04-13-1946  Status: outpatient   CC: Parke Poisson, MD  Pieter Partridge, MD ,  Dr. Rolm Bookbinder   REFERRING PHYSICIAN: Pieter Partridge, MD   DIAGNOSIS: Stage 0 (Tis N0 M0) low-grade DCIS of the right breast   HISTORY OF PRESENT ILLNESS:  Linda [name] is a 68 y.o. female who is seen today at the request of Dr. Everardo All for evaluation of her stage 0 (Tis N0 M0) low-grade DCIS of the right breast. With a family history of breast cancer in her mother at age 50 and her sister at age 88, she underwent screening MRI and mammography. Her screening mammogram at the [facility] on 01/26/2014 was without evidence for malignancy. Breast MR on February 03, 2014  showed a 7 x 6 x 5 mm mass in the inner lower right breast and two immediately adjacent 3 mm foci of enhancement.  She visited [facility] for consultation with Dr. Nadara Mustard- McNatt. Breast ultrasound was unremarkable. She had MRI guided biopsies on 04/19/2014. She was found to have focal DCIS, micropapillary and apocrine types, along with sclerosing papilloma and calcifications. The DCIS was ER positive at 70% and PR positive at 80%. She is without complaints today.  PREVIOUS RADIATION THERAPY: No   PAST MEDICAL HISTORY:  has a past medical history of Hypercholesteremia; Atrophic vaginitis; Cervical dysplasia (1975); Osteopenia (11/2011); Breast cancer; Anxiety; Insomnia; Arthritis; and Skin cancer (2006).     PAST SURGICAL HISTORY:  Past Surgical History  Procedure Laterality Date  . Appendectomy  1955  . Excision of basal cell ca from face  2006  . Breast biopsy  2010    FIBROCYSTIC CHANGES WITH CALCIFICATIONS, RADIAL SCLEROSING LESION WITH CALCIFICATIONS.  . Breast lumpectomy  1983    BENIGN  . Breast biopsy  2009  . Cataract extraction      Both eyes  . Knee surgery  2013   Arthroscopic  . Gynecologic cryosurgery    . Colposcopy    . Cataract surgery  2005  . Facelift  2014  . Facial cosmetic surgery       FAMILY HISTORY: family history includes Breast cancer in her mother and sister; Cancer in her paternal grandfather; Colon cancer in her paternal grandfather; Diabetes in her son; Heart disease in her maternal grandfather; Hyperlipidemia in her mother; Hypertension in her maternal grandmother; Osteoporosis in her mother; Stroke in her maternal grandfather. Married, 4 children. 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   SOCIAL HISTORY:  reports that she quit smoking about 40 years ago. She does not have any smokeless tobacco history on file. She reports that she drinks about 2.5 ounces of alcohol per week. She reports that she does not use illicit drugs. Her father died at age 40 from old age, and her mother died at age 5, also from old age. Family history remarkable for a sister was diagnosed with DCIS at age 72 and her mother who was diagnosed at age 42 he underwent partial mastectomy and radiation therapy with a good outcome.   ALLERGIES: Review of patient's allergies indicates no known allergies.   MEDICATIONS:  Current Outpatient Prescriptions  Medication Sig Dispense Refill  . ALPRAZolam (XANAX) 0.25 MG tablet Take 0.25 mg by mouth at bedtime as needed.      . Ascorbic Acid (VITAMIN  C PO) Take by mouth.      Marland Kitchen BIOTIN PO Take by mouth.      . Cholecalciferol (VITAMIN D PO) Take by mouth.      [name] Calcium (STOOL SOFTENER PO) Take by mouth.      . fluticasone (FLONASE) 50 MCG/ACT nasal spray Place into both nostrils daily.      . Loratadine (CLARITIN PO) Take by mouth.      . Multiple Vitamin (MULTIVITAMIN) capsule Take 1 capsule by mouth daily.        . Probiotic Product (PROBIOTIC PO) Take by mouth.      . raloxifene (EVISTA) 60 MG tablet Take 1 tablet (60 mg total) by mouth daily.  30 tablet  11  . simvastatin (ZOCOR) 20 MG tablet Take 20  mg by mouth every evening.      . Zolpidem Tartrate (AMBIEN PO) Take by mouth.        No current facility-administered medications for this encounter.     REVIEW OF SYSTEMS:  Pertinent items are noted in HPI.    PHYSICAL EXAM:  height is 5\' 8"  (1.727 m) and weight is 151 lb 12.8 oz (68.856 kg). Her oral temperature is 98 F (36.7 C). Her blood pressure is 117/80 and her pulse is 68. Her respiration is 20.   Alert and oriented 68 year old white female appearing younger than her stated age. Head and neck examination: Grossly unremarkable. Nodes: Without palpable cervical, supraclavicular, or axillary lymphadenopathy. Chest: Lungs clear. Breasts: There is a punctate biopsy wound at approximately 9:00 along the medial aspect of the right breast. There is a periareolar scar from a previous biopsy in the remote past. No masses are appreciated. Left breast without masses or lesions. Abdomen: Without hepatomegaly. Extremities: Without edema.   LABORATORY DATA:  No results found for this basename: WBC, HGB, HCT, MCV, PLT   No results found for this basename: NA, K, CL, CO2   No results found for this basename: ALT, AST, GGT, ALKPHOS, BILITOT      IMPRESSION: Stage 0 (Tis N0 M0) low-grade DCIS of the right breast. I explained to the patient DCIS is generally diagnosed from mammographic calcifications, but none were apparently seen on her recent mammography this past March. Interestingly, calcifications were present within her biopsy specimens. We discussed local management options which includes mastectomy versus partial mastectomy with or without adjuvant radiation therapy. We also discussed adjuvant/chemopreventive antiestrogen therapy. We briefly reviewed the preliminary findings of the RTOG 08/02/2003 protocol which evaluated  good risk DCIS patients,  trying to determine the benefit of adjuvant radiation therapy. She is in excellent health with expected longevity, and I would consider offering her  radiation therapy following conservative surgery depending on her surgical findings. We discussed hypo-fractionated treatment as well. We discussed the potential acute and late toxicities of radiation therapy which is generally well tolerated. Lastly, I told that we would have her visit medical oncology following her definitive treatment for discussion of anti-estrogen therapy. She'll meet with Dr. Rolm Bookbinder on July 2 for discussion of surgery.   PLAN:  As discussed above. I ask that Dr. Donne Hazel keep me posted on her progress.   I spent 60 minutes minutes face to face with the patient and more than 50% of that time was spent in counseling and/or coordination of care.

## 2014-05-19 NOTE — Progress Notes (Signed)
Please see the Nurse Progress Note in the MD Initial Consult Encounter for this patient. 

## 2014-05-25 ENCOUNTER — Encounter (INDEPENDENT_AMBULATORY_CARE_PROVIDER_SITE_OTHER): Payer: Self-pay | Admitting: General Surgery

## 2014-05-25 ENCOUNTER — Telehealth (INDEPENDENT_AMBULATORY_CARE_PROVIDER_SITE_OTHER): Payer: Self-pay

## 2014-05-25 ENCOUNTER — Ambulatory Visit (INDEPENDENT_AMBULATORY_CARE_PROVIDER_SITE_OTHER): Payer: Commercial Managed Care - PPO | Admitting: General Surgery

## 2014-05-25 VITALS — BP 110/60 | HR 71 | Temp 98.3°F | Resp 16 | Ht 68.0 in | Wt 151.2 lb

## 2014-05-25 DIAGNOSIS — C50319 Malignant neoplasm of lower-inner quadrant of unspecified female breast: Secondary | ICD-10-CM

## 2014-05-25 DIAGNOSIS — C50311 Malignant neoplasm of lower-inner quadrant of right female breast: Secondary | ICD-10-CM

## 2014-05-25 NOTE — Progress Notes (Signed)
Patient ID: Linda [name], female   DOB: 09/02/1946, 67 y.o.   MRN: [id number]  Chief Complaint  Patient presents with  . Follow-up    breast    HPI Linda [name] is a 67 y.o. female.  Referred by Dr Eric Neijstrom HPI This is a 67-year-old female who is otherwise healthy who presents with an MRI found breast lesion. She has a family history of breast cancer in her sister in her 40s who was treated with a bilateral mastectomy for stage 0 breast cancer. She states she had lobular carcinoma in situ found in her contralateral breast. She is doing well. She also had a mom in her 80s undergo a lumpectomy and radiation therapy. She has undergone genetic testing about 10 years ago and is negative for BRCA1 and BRCA2. She had no breast symptoms and has been getting occasional MRIs along with mammograms. She has had multiple procedures on her right breast including cyst aspirations, and excisional biopsy, a prior MRI guided biopsy, and a prior stereotactic guided biopsy. She has no nipple discharge or any masses she can identify. On her last mammogram done in March of 2015 this is negative. She has density category B. She has undergone an MRI that shows in the right breast a 7 x 6 x 5 mm oval mass the immediately lateral to this there are 2 3 mm foci of enhancement. The left breast is otherwise normal and there are no abnormal appearing lymph nodes. She underwent a biopsy that shows ductal carcinoma in situ of calcifications that is low to intermediate grade  per her [facility] pathology report the tumor cells are strongly positive for estrogen receptor as 70% and progesterone receptor at 80%. She comes in today to discuss her options.   Past Medical History  Diagnosis Date  . Hypercholesteremia   . Atrophic vaginitis   . Cervical dysplasia 1975  . Osteopenia 11/2011    T score -1.7 FRAX not calculated due to history of bisphosphonates and current Evista  . Breast cancer     right  . Anxiety   . Insomnia    . Arthritis   . Skin cancer 2006    basal cell of face    Past Surgical History  Procedure Laterality Date  . Appendectomy  1955  . Excision of basal cell ca from face  2006  . Breast biopsy  2010    FIBROCYSTIC CHANGES WITH CALCIFICATIONS, RADIAL SCLEROSING LESION WITH CALCIFICATIONS.  . Breast lumpectomy  1983    BENIGN  . Breast biopsy  2009  . Cataract extraction      Both eyes  . Knee surgery  2013    Arthroscopic  . Gynecologic cryosurgery    . Colposcopy    . Cataract surgery  2005  . Facelift  2014  . Facial cosmetic surgery      Family History  Problem Relation Age of Onset  . Breast cancer Mother     DIAGNOSED IN HER 80'S  . Hyperlipidemia Mother   . Osteoporosis Mother   . Breast cancer Sister     DIAGNOSED AT AGE 48  . Hypertension Maternal Grandmother   . Heart disease Maternal Grandfather   . Stroke Maternal Grandfather   . Diabetes Son     TYPE 1 DIABETES  . Colon cancer Paternal Grandfather   . Cancer Paternal Grandfather     colon    Social History History  Substance Use Topics  . Smoking status: Former Smoker      Quit date: 05/19/1974  . Smokeless tobacco: Not on file     Comment: smoked in college  . Alcohol Use: 2.5 oz/week    5 drink(s) per week    No Known Allergies  Current Outpatient Prescriptions  Medication Sig Dispense Refill  . ALPRAZolam (XANAX) 0.25 MG tablet Take 0.25 mg by mouth at bedtime as needed.      . Cholecalciferol (VITAMIN D PO) Take by mouth.      . Docusate Calcium (STOOL SOFTENER PO) Take by mouth.      . fluticasone (FLONASE) 50 MCG/ACT nasal spray Place into both nostrils daily.      . Loratadine (CLARITIN PO) Take by mouth.      . Multiple Vitamin (MULTIVITAMIN) capsule Take 1 capsule by mouth daily.        . Probiotic Product (PROBIOTIC PO) Take by mouth.      . raloxifene (EVISTA) 60 MG tablet Take 1 tablet (60 mg total) by mouth daily.  30 tablet  11  . simvastatin (ZOCOR) 20 MG tablet Take 20 mg by  mouth every evening.      . Zolpidem Tartrate (AMBIEN PO) Take by mouth.        No current facility-administered medications for this visit.    Review of Systems Review of Systems  Constitutional: Negative for fever, chills and unexpected weight change.  HENT: Negative for congestion, hearing loss, sore throat, trouble swallowing and voice change.   Eyes: Negative for visual disturbance.  Respiratory: Negative for cough and wheezing.   Cardiovascular: Negative for chest pain, palpitations and leg swelling.  Gastrointestinal: Negative for nausea, vomiting, abdominal pain, diarrhea, constipation, blood in stool, abdominal distention and anal bleeding.  Genitourinary: Negative for hematuria, vaginal bleeding and difficulty urinating.  Musculoskeletal: Negative for arthralgias.  Skin: Negative for rash and wound.  Neurological: Negative for seizures, syncope and headaches.  Hematological: Negative for adenopathy. Does not bruise/bleed easily.  Psychiatric/Behavioral: Negative for confusion.    Blood pressure 110/60, pulse 71, temperature 98.3 F (36.8 C), resp. rate 16, height 5' 8" (1.727 m), weight 151 lb 3.2 oz (68.584 kg).  Physical Exam Physical Exam  Constitutional: She appears well-developed and well-nourished.  Neck: Neck supple.  Cardiovascular: Normal rate, regular rhythm and normal heart sounds.   Pulmonary/Chest: Effort normal and breath sounds normal. She has no wheezes. She has no rales. Right breast exhibits no inverted nipple, no mass, no nipple discharge, no skin change and no tenderness. Left breast exhibits no inverted nipple, no mass, no nipple discharge, no skin change and no tenderness.    Lymphadenopathy:    She has no cervical adenopathy.    She has no axillary adenopathy.       Right: No supraclavicular adenopathy present.       Left: No supraclavicular adenopathy present.    Data Reviewed EXAM:  BILATERAL BREAST MRI WITH AND WITHOUT CONTRAST    TECHNIQUE:  Multiplanar, multisequence MR images of both breasts were obtained  prior to and following the intravenous administration of 14ml of  MultiHance.  THREE-DIMENSIONAL MR IMAGE RENDERING ON INDEPENDENT WORKSTATION:  Three-dimensional MR images were rendered by post-processing of the  original MR data on an independent workstation. The  three-dimensional MR images were interpreted, and findings are  reported in the following complete MRI report for this study. Three  dimensional images were evaluated at the independent DynaCad  workstation  COMPARISON: Previous exams  FINDINGS:  Breast composition: b. Scattered fibroglandular tissue.  Background parenchymal enhancement:   Moderate  Right breast: The previously biopsied linear enhancement in the  outer lower right breast is without significant change since 2007.  In the lower slightly inner right breast, there is a 7 x 6 x 5 mm  oval mass with plateau kinetics. Immediately lateral to this mass  are two 3 mm foci of enhancement. There is a complex parenchymal  pattern of the right breast with multiple similar appearing foci of  enhancement.  Left breast: There is a complex parenchymal pattern with multiple  similar foci of enhancement. No dominant mass.  Lymph nodes: No abnormal appearing lymph nodes.  Ancillary findings: None.  IMPRESSION:  A 7 x 6 x 5 mm mass with plateau kinetics in the inner lower right  breast and two immediately adjacent 3 mm foci of enhancement.  RECOMMENDATION:  Right breast ultrasound is recommended to determine if the mass seen  on MRI can be identified. If seen, an ultrasound-guided right breast  biopsy is recommended. If the mass is not seen sonographically, an  MRI guided biopsy is recommended.    Assessment    Clinical stage 0 right breast cancer Plan    Right breast radioactive seed guided lumpectomy      We discussed the staging and pathophysiology of breast cancer. We discussed all  of the different options for treatment for dcis.  We discussed possibility on final pathology that she might have invasive disease but this possibility is low. I do not think she needs a sentinel node biopsy but she understands we may need to go back to or and do this if she has invasive disease. We discussed the options for treatment of the breast cancer which included lumpectomy versus a mastectomy. We discussed the performance of the lumpectomy with seed placement. We discussed a 5-10% chance of a positive margin requiring reexcision in the operating room. We also discussed that she will need radiation therapy if she undergoes lumpectomy. We discussed the mastectomy and the postoperative care for that as well. She would be a candidate for nipple sparing mastectomy and reconstruction if she chose. We discussed that there is no difference in her survival whether she undergoes lumpectomy with radiation therapy versus a mastectomy.  We discussed the risks of operation including bleeding, infection, possible reoperation. She understands her further therapy will be based on what her stages at the time of her operation.      Eliazar Olivar 05/25/2014, 3:38 PM    

## 2014-05-25 NOTE — Telephone Encounter (Signed)
Needed to go into the chart for care everywhere b/c I was trying to print path results from MRI bx on 5/27 for pt's appt today.

## 2014-06-02 ENCOUNTER — Other Ambulatory Visit (INDEPENDENT_AMBULATORY_CARE_PROVIDER_SITE_OTHER): Payer: Self-pay | Admitting: General Surgery

## 2014-06-02 ENCOUNTER — Telehealth (INDEPENDENT_AMBULATORY_CARE_PROVIDER_SITE_OTHER): Payer: Self-pay

## 2014-06-02 DIAGNOSIS — C50311 Malignant neoplasm of lower-inner quadrant of right female breast: Secondary | ICD-10-CM

## 2014-06-02 NOTE — Telephone Encounter (Signed)
LMOM for pt to call me. I need to see about getting her images from [facility] on the MRI bx 5/27 and if they did a post mgm for clip placement from the bx. These images are needed in order for the pt to get the seed placement on 7/16 with the [facility]. [facility] will not do anything without a signed release from pt so I need to see if she will go get the CD or chance trying to get the CD Fed Ex from Radiology. [facility] [employer] Is closed right now. I just want to see what the pt would like to do either come here to [facility] to sign med. Release or have her go to [facility] to get the CD herself to bring to the [facility].  [facility] med rec.[facility] fx# [phone]

## 2014-06-02 NOTE — Telephone Encounter (Signed)
Pt called me back. I explained what I was needing from her in order for the seed placement to work out on 7/16. [place] on [place]. The pt will go to [facility] on Monday to p/u the images on CD from 5/27 mri guided bx along with the mgm they did immediately after the bx. The pt will then bring the CD to the [facility] to give to Volanda Napoleon for the seed placement. I did give the pt the phone# to [facility] med rec dept. To call to see if they would go ahead and get the CD printed out ready for p/u on Monday. Pt understands. I will send Volanda Napoleon a staff message in epic.

## 2014-06-06 ENCOUNTER — Other Ambulatory Visit: Payer: Self-pay | Admitting: Dermatology

## 2014-06-07 ENCOUNTER — Encounter (HOSPITAL_BASED_OUTPATIENT_CLINIC_OR_DEPARTMENT_OTHER): Payer: Self-pay | Admitting: *Deleted

## 2014-06-07 ENCOUNTER — Telehealth (INDEPENDENT_AMBULATORY_CARE_PROVIDER_SITE_OTHER): Payer: Self-pay

## 2014-06-07 NOTE — Telephone Encounter (Signed)
Message copied by Illene Regulus on Wed Jun 07, 2014  5:03 PM ------      Message from: Overton Mam      Created: Fri Jun 02, 2014  9:45 AM      Regarding: Please call her       Gave her the surgery information but she now has some medical questions.  I told her you would call her but did not tell her when.            Thanks ------

## 2014-06-07 NOTE — Telephone Encounter (Signed)
LMOM for pt to call me back.

## 2014-06-07 NOTE — Progress Notes (Signed)
To come by for bmet-[facility] had ekg [facility] 6/150was to have surgery there-decided to do here

## 2014-06-08 ENCOUNTER — Ambulatory Visit
Admission: RE | Admit: 2014-06-08 | Discharge: 2014-06-08 | Disposition: A | Discharge status: Home / Self Care | Payer: Commercial Managed Care - PPO | Source: Ambulatory Visit | Attending: General Surgery | Admitting: General Surgery

## 2014-06-08 ENCOUNTER — Encounter (HOSPITAL_BASED_OUTPATIENT_CLINIC_OR_DEPARTMENT_OTHER)
Admission: RE | Admit: 2014-06-08 | Discharge: 2014-06-08 | Disposition: A | Discharge status: Home / Self Care | Payer: Commercial Managed Care - PPO | Source: Ambulatory Visit | Attending: General Surgery | Admitting: General Surgery

## 2014-06-08 DIAGNOSIS — E7800 Pure hypercholesterolemia, unspecified: Secondary | ICD-10-CM | POA: Diagnosis not present

## 2014-06-08 DIAGNOSIS — G4700 Insomnia, unspecified: Secondary | ICD-10-CM | POA: Diagnosis not present

## 2014-06-08 DIAGNOSIS — Z87891 Personal history of nicotine dependence: Secondary | ICD-10-CM | POA: Diagnosis not present

## 2014-06-08 DIAGNOSIS — Z85828 Personal history of other malignant neoplasm of skin: Secondary | ICD-10-CM | POA: Diagnosis not present

## 2014-06-08 DIAGNOSIS — Z803 Family history of malignant neoplasm of breast: Secondary | ICD-10-CM | POA: Diagnosis not present

## 2014-06-08 DIAGNOSIS — Z01812 Encounter for preprocedural laboratory examination: Secondary | ICD-10-CM | POA: Diagnosis not present

## 2014-06-08 DIAGNOSIS — F411 Generalized anxiety disorder: Secondary | ICD-10-CM | POA: Diagnosis not present

## 2014-06-08 DIAGNOSIS — C50311 Malignant neoplasm of lower-inner quadrant of right female breast: Secondary | ICD-10-CM

## 2014-06-08 DIAGNOSIS — D0590 Unspecified type of carcinoma in situ of unspecified breast: Secondary | ICD-10-CM | POA: Diagnosis not present

## 2014-06-08 LAB — CBC WITH DIFFERENTIAL/PLATELET
Basophils Absolute: 0 10*3/uL (ref 0.0–0.1)
Basophils Relative: 0 % (ref 0–1)
Eosinophils Absolute: 0.2 10*3/uL (ref 0.0–0.7)
Eosinophils Relative: 4 % (ref 0–5)
HCT: 40.1 % (ref 36.0–46.0)
Hemoglobin: 13.4 g/dL (ref 12.0–15.0)
Lymphocytes Relative: 25 % (ref 12–46)
Lymphs Abs: 1.6 10*3/uL (ref 0.7–4.0)
MCH: 32.6 pg (ref 26.0–34.0)
MCHC: 33.4 g/dL (ref 30.0–36.0)
MCV: 97.6 fL (ref 78.0–100.0)
Monocytes Absolute: 0.6 10*3/uL (ref 0.1–1.0)
Monocytes Relative: 9 % (ref 3–12)
Neutro Abs: 4.1 10*3/uL (ref 1.7–7.7)
Neutrophils Relative %: 62 % (ref 43–77)
Platelets: 285 10*3/uL (ref 150–400)
RBC: 4.11 MIL/uL (ref 3.87–5.11)
RDW: 13.4 % (ref 11.5–15.5)
WBC: 6.5 10*3/uL (ref 4.0–10.5)

## 2014-06-08 LAB — BASIC METABOLIC PANEL
Anion gap: 13 (ref 5–15)
BUN: 23 mg/dL (ref 6–23)
CO2: 26 mEq/L (ref 19–32)
Calcium: 9.2 mg/dL (ref 8.4–10.5)
Chloride: 102 mEq/L (ref 96–112)
Creatinine, Ser: 0.89 mg/dL (ref 0.50–1.10)
GFR calc Af Amer: 76 mL/min — ABNORMAL LOW (ref >90)
GFR calc non Af Amer: 66 mL/min — ABNORMAL LOW (ref >90)
Glucose, Bld: 91 mg/dL (ref 70–99)
Potassium: 5.1 mEq/L (ref 3.7–5.3)
Sodium: 141 mEq/L (ref 137–147)

## 2014-06-13 ENCOUNTER — Ambulatory Visit (HOSPITAL_BASED_OUTPATIENT_CLINIC_OR_DEPARTMENT_OTHER)
Admission: RE | Admit: 2014-06-13 | Discharge: 2014-06-13 | Disposition: A | Discharge status: Home / Self Care | Payer: Commercial Managed Care - PPO | Source: Ambulatory Visit | Attending: General Surgery | Admitting: General Surgery

## 2014-06-13 ENCOUNTER — Ambulatory Visit (HOSPITAL_BASED_OUTPATIENT_CLINIC_OR_DEPARTMENT_OTHER): Payer: Commercial Managed Care - PPO | Admitting: Anesthesiology

## 2014-06-13 ENCOUNTER — Encounter (HOSPITAL_BASED_OUTPATIENT_CLINIC_OR_DEPARTMENT_OTHER): Payer: Commercial Managed Care - PPO | Admitting: Anesthesiology

## 2014-06-13 ENCOUNTER — Encounter (HOSPITAL_BASED_OUTPATIENT_CLINIC_OR_DEPARTMENT_OTHER): Payer: Self-pay | Admitting: *Deleted

## 2014-06-13 ENCOUNTER — Encounter (HOSPITAL_BASED_OUTPATIENT_CLINIC_OR_DEPARTMENT_OTHER)
Admission: RE | Disposition: A | Discharge status: Home / Self Care | Payer: Self-pay | Source: Ambulatory Visit | Attending: General Surgery

## 2014-06-13 ENCOUNTER — Ambulatory Visit
Admission: RE | Admit: 2014-06-13 | Discharge: 2014-06-13 | Disposition: A | Discharge status: Home / Self Care | Payer: Commercial Managed Care - PPO | Source: Ambulatory Visit | Attending: General Surgery | Admitting: General Surgery

## 2014-06-13 DIAGNOSIS — C50311 Malignant neoplasm of lower-inner quadrant of right female breast: Secondary | ICD-10-CM

## 2014-06-13 DIAGNOSIS — Z803 Family history of malignant neoplasm of breast: Secondary | ICD-10-CM | POA: Insufficient documentation

## 2014-06-13 DIAGNOSIS — G4700 Insomnia, unspecified: Secondary | ICD-10-CM | POA: Insufficient documentation

## 2014-06-13 DIAGNOSIS — D0590 Unspecified type of carcinoma in situ of unspecified breast: Secondary | ICD-10-CM | POA: Insufficient documentation

## 2014-06-13 DIAGNOSIS — Z85828 Personal history of other malignant neoplasm of skin: Secondary | ICD-10-CM | POA: Insufficient documentation

## 2014-06-13 DIAGNOSIS — E7800 Pure hypercholesterolemia, unspecified: Secondary | ICD-10-CM | POA: Insufficient documentation

## 2014-06-13 DIAGNOSIS — C50319 Malignant neoplasm of lower-inner quadrant of unspecified female breast: Secondary | ICD-10-CM

## 2014-06-13 DIAGNOSIS — Z87891 Personal history of nicotine dependence: Secondary | ICD-10-CM | POA: Insufficient documentation

## 2014-06-13 DIAGNOSIS — Z01812 Encounter for preprocedural laboratory examination: Secondary | ICD-10-CM | POA: Insufficient documentation

## 2014-06-13 DIAGNOSIS — F411 Generalized anxiety disorder: Secondary | ICD-10-CM | POA: Insufficient documentation

## 2014-06-13 HISTORY — DX: Other seasonal allergic rhinitis: J30.2

## 2014-06-13 HISTORY — PX: BREAST LUMPECTOMY WITH RADIOACTIVE SEED LOCALIZATION: SHX6424

## 2014-06-13 SURGERY — BREAST LUMPECTOMY WITH RADIOACTIVE SEED LOCALIZATION
Anesthesia: General | Site: Breast | Laterality: Right

## 2014-06-13 MED ORDER — DEXAMETHASONE SODIUM PHOSPHATE 4 MG/ML IJ SOLN
INTRAMUSCULAR | Status: DC | PRN
  Administered 2014-06-13: 8 mg via INTRAVENOUS

## 2014-06-13 MED ORDER — HYDROMORPHONE HCL PF 1 MG/ML IJ SOLN
INTRAMUSCULAR | Status: AC
  Filled 2014-06-13: qty 1

## 2014-06-13 MED ORDER — MIDAZOLAM HCL 2 MG/2ML IJ SOLN
INTRAMUSCULAR | Status: AC
  Filled 2014-06-13: qty 2

## 2014-06-13 MED ORDER — HYDROMORPHONE HCL PF 1 MG/ML IJ SOLN
0.2500 mg | INTRAMUSCULAR | Status: DC | PRN
  Administered 2014-06-13: 0.5 mg via INTRAVENOUS

## 2014-06-13 MED ORDER — LACTATED RINGERS IV SOLN
INTRAVENOUS | Status: DC
  Administered 2014-06-13 (×2): via INTRAVENOUS

## 2014-06-13 MED ORDER — FENTANYL CITRATE 0.05 MG/ML IJ SOLN
INTRAMUSCULAR | Status: DC | PRN
  Administered 2014-06-13: 100 ug via INTRAVENOUS

## 2014-06-13 MED ORDER — ACETAMINOPHEN 500 MG PO TABS
1000.0000 mg | ORAL_TABLET | Freq: Once | ORAL | Status: AC
  Administered 2014-06-13: 1000 mg via ORAL

## 2014-06-13 MED ORDER — LIDOCAINE HCL (CARDIAC) 20 MG/ML IV SOLN
INTRAVENOUS | Status: DC | PRN
  Administered 2014-06-13: 75 mg via INTRAVENOUS

## 2014-06-13 MED ORDER — PROPOFOL 10 MG/ML IV BOLUS
INTRAVENOUS | Status: DC | PRN
  Administered 2014-06-13: 180 mg via INTRAVENOUS

## 2014-06-13 MED ORDER — OXYCODONE HCL 5 MG PO TABS: 5.0000 mg | ORAL_TABLET | Freq: Once | ORAL | Status: DC | PRN

## 2014-06-13 MED ORDER — OXYCODONE HCL 5 MG/5ML PO SOLN: 5.0000 mg | Freq: Once | ORAL | Status: DC | PRN

## 2014-06-13 MED ORDER — FENTANYL CITRATE 0.05 MG/ML IJ SOLN
INTRAMUSCULAR | Status: AC
  Filled 2014-06-13: qty 6

## 2014-06-13 MED ORDER — FENTANYL CITRATE 0.05 MG/ML IJ SOLN: 50.0000 ug | INTRAMUSCULAR | Status: DC | PRN

## 2014-06-13 MED ORDER — CEFAZOLIN SODIUM-DEXTROSE 2-3 GM-% IV SOLR
2.0000 g | INTRAVENOUS | Status: AC
  Administered 2014-06-13: 2 g via INTRAVENOUS

## 2014-06-13 MED ORDER — CEFAZOLIN SODIUM-DEXTROSE 2-3 GM-% IV SOLR
INTRAVENOUS | Status: AC
  Filled 2014-06-13: qty 50

## 2014-06-13 MED ORDER — OXYCODONE-ACETAMINOPHEN 10-325 MG PO TABS: 1.0000 | ORAL_TABLET | Freq: Four times a day (QID) | ORAL | Status: DC | PRN

## 2014-06-13 MED ORDER — MIDAZOLAM HCL 5 MG/5ML IJ SOLN
INTRAMUSCULAR | Status: DC | PRN
  Administered 2014-06-13: 2 mg via INTRAVENOUS

## 2014-06-13 MED ORDER — PROMETHAZINE HCL 25 MG/ML IJ SOLN: 6.2500 mg | INTRAMUSCULAR | Status: DC | PRN

## 2014-06-13 MED ORDER — KETOROLAC TROMETHAMINE 30 MG/ML IJ SOLN
INTRAMUSCULAR | Status: DC | PRN
  Administered 2014-06-13: 30 mg via INTRAVENOUS

## 2014-06-13 MED ORDER — BUPIVACAINE HCL (PF) 0.25 % IJ SOLN
INTRAMUSCULAR | Status: AC
  Filled 2014-06-13: qty 30

## 2014-06-13 MED ORDER — BUPIVACAINE HCL (PF) 0.25 % IJ SOLN
INTRAMUSCULAR | Status: DC | PRN
  Administered 2014-06-13: 20 mL

## 2014-06-13 MED ORDER — MIDAZOLAM HCL 2 MG/2ML IJ SOLN: 1.0000 mg | INTRAMUSCULAR | Status: DC | PRN

## 2014-06-13 SURGICAL SUPPLY — 61 items
ADH SKN CLS APL DERMABOND .7 (GAUZE/BANDAGES/DRESSINGS) ×1
APL SKNCLS STERI-STRIP NONHPOA (GAUZE/BANDAGES/DRESSINGS) ×1
APPLIER CLIP 9.375 MED OPEN (MISCELLANEOUS) ×2
APR CLP MED 9.3 20 MLT OPN (MISCELLANEOUS) ×1
BENZOIN TINCTURE PRP APPL 2/3 (GAUZE/BANDAGES/DRESSINGS) ×2 IMPLANT
BINDER BREAST LRG (GAUZE/BANDAGES/DRESSINGS) IMPLANT
BINDER BREAST MEDIUM (GAUZE/BANDAGES/DRESSINGS) IMPLANT
BINDER BREAST XLRG (GAUZE/BANDAGES/DRESSINGS) IMPLANT
BINDER BREAST XXLRG (GAUZE/BANDAGES/DRESSINGS) IMPLANT
BLADE SURG 15 STRL LF DISP TIS (BLADE) ×1 IMPLANT
BLADE SURG 15 STRL SS (BLADE) ×2
CANISTER SUC SOCK COL 7IN (MISCELLANEOUS) IMPLANT
CANISTER SUCT 1200ML W/VALVE (MISCELLANEOUS) IMPLANT
CHLORAPREP W/TINT 26ML (MISCELLANEOUS) ×2 IMPLANT
CLIP APPLIE 9.375 MED OPEN (MISCELLANEOUS) IMPLANT
COVER MAYO STAND STRL (DRAPES) ×2 IMPLANT
COVER PROBE W GEL 5X96 (DRAPES) ×2 IMPLANT
COVER TABLE BACK 60X90 (DRAPES) ×2 IMPLANT
DECANTER SPIKE VIAL GLASS SM (MISCELLANEOUS) IMPLANT
DERMABOND ADVANCED (GAUZE/BANDAGES/DRESSINGS) ×1
DERMABOND ADVANCED .7 DNX12 (GAUZE/BANDAGES/DRESSINGS) ×1 IMPLANT
DEVICE DUBIN W/COMP PLATE 8390 (MISCELLANEOUS) ×2 IMPLANT
DRAPE PED LAPAROTOMY (DRAPES) ×2 IMPLANT
DRSG TEGADERM 4X4.75 (GAUZE/BANDAGES/DRESSINGS) ×2 IMPLANT
ELECT COATED BLADE 2.86 ST (ELECTRODE) ×2 IMPLANT
ELECT REM PT RETURN 9FT ADLT (ELECTROSURGICAL) ×2
ELECTRODE REM PT RTRN 9FT ADLT (ELECTROSURGICAL) ×1 IMPLANT
GLOVE BIO SURGEON STRL SZ 6.5 (GLOVE) ×1 IMPLANT
GLOVE BIO SURGEON STRL SZ7 (GLOVE) ×4 IMPLANT
GLOVE BIOGEL PI IND STRL 7.0 (GLOVE) IMPLANT
GLOVE BIOGEL PI IND STRL 7.5 (GLOVE) ×1 IMPLANT
GLOVE BIOGEL PI INDICATOR 7.0 (GLOVE) ×1
GLOVE BIOGEL PI INDICATOR 7.5 (GLOVE) ×1
GLOVE EXAM NITRILE PF MED BLUE (GLOVE) ×1 IMPLANT
GOWN STRL REUS W/ TWL LRG LVL3 (GOWN DISPOSABLE) ×2 IMPLANT
GOWN STRL REUS W/TWL LRG LVL3 (GOWN DISPOSABLE) ×4
KIT MARKER MARGIN INK (KITS) ×2 IMPLANT
NDL HYPO 25X1 1.5 SAFETY (NEEDLE) ×1 IMPLANT
NEEDLE HYPO 25X1 1.5 SAFETY (NEEDLE) ×2 IMPLANT
NS IRRIG 1000ML POUR BTL (IV SOLUTION) ×1 IMPLANT
PACK BASIN DAY SURGERY FS (CUSTOM PROCEDURE TRAY) ×2 IMPLANT
PENCIL BUTTON HOLSTER BLD 10FT (ELECTRODE) ×2 IMPLANT
SLEEVE SCD COMPRESS KNEE MED (MISCELLANEOUS) ×2 IMPLANT
SPONGE GAUZE 4X4 12PLY STER LF (GAUZE/BANDAGES/DRESSINGS) ×2 IMPLANT
SPONGE LAP 4X18 X RAY DECT (DISPOSABLE) ×2 IMPLANT
STAPLER VISISTAT 35W (STAPLE) IMPLANT
STRIP CLOSURE SKIN 1/2X4 (GAUZE/BANDAGES/DRESSINGS) ×2 IMPLANT
SUT MNCRL AB 4-0 PS2 18 (SUTURE) ×2 IMPLANT
SUT MON AB 5-0 PS2 18 (SUTURE) ×1 IMPLANT
SUT SILK 2 0 SH (SUTURE) IMPLANT
SUT VIC AB 2-0 SH 27 (SUTURE) ×6
SUT VIC AB 2-0 SH 27XBRD (SUTURE) ×1 IMPLANT
SUT VIC AB 3-0 SH 27 (SUTURE) ×4
SUT VIC AB 3-0 SH 27X BRD (SUTURE) ×1 IMPLANT
SUT VIC AB 5-0 PS2 18 (SUTURE) IMPLANT
SYRINGE CONTROL L 12CC (SYRINGE) ×2 IMPLANT
SYRINGE CONTROL LL 12CC (SYRINGE) ×1 IMPLANT
TOWEL OR 17X24 6PK STRL BLUE (TOWEL DISPOSABLE) ×2 IMPLANT
TOWEL OR NON WOVEN STRL DISP B (DISPOSABLE) ×2 IMPLANT
TUBE CONNECTING 20X1/4 (TUBING) IMPLANT
YANKAUER SUCT BULB TIP NO VENT (SUCTIONS) ×1 IMPLANT

## 2014-06-13 NOTE — Anesthesia Postprocedure Evaluation (Signed)
  Anesthesia Post-op Note  Patient: Linda [name]  Procedure(s) Performed: Procedure(s): RIGHT BREAST  RADIOACTIVE SEED GUIDED LUMPECTOMY (Right)  Patient Location: PACU  Anesthesia Type:General  Level of Consciousness: awake and alert   Airway and Oxygen Therapy: Patient Spontanous Breathing  Post-op Pain: mild  Post-op Assessment: Post-op Vital signs reviewed  Post-op Vital Signs: stable  Last Vitals:  Filed Vitals:   06/13/14 1030  BP: 114/61  Pulse: 82  Temp:   Resp: 18    Complications: No apparent anesthesia complications

## 2014-06-13 NOTE — Transfer of Care (Signed)
Immediate Anesthesia Transfer of Care Note  Patient: Linda [name]  Procedure(s) Performed: Procedure(s): RIGHT BREAST  RADIOACTIVE SEED GUIDED LUMPECTOMY (Right)  Patient Location: PACU  Anesthesia Type:General  Level of Consciousness: awake  Airway & Oxygen Therapy: Patient Spontanous Breathing and Patient connected to face mask oxygen  Post-op Assessment: Report given to PACU RN and Post -op Vital signs reviewed and stable  Post vital signs: Reviewed and stable  Complications: No apparent anesthesia complications

## 2014-06-13 NOTE — Anesthesia Procedure Notes (Signed)
Procedure Name: LMA Insertion Date/Time: 06/13/2014 9:02 AM Performed by: Melynda Ripple D Pre-anesthesia Checklist: Patient identified, Emergency Drugs available, Suction available and Patient being monitored Patient Re-evaluated:Patient Re-evaluated prior to inductionOxygen Delivery Method: Circle System Utilized Preoxygenation: Pre-oxygenation with 100% oxygen Intubation Type: IV induction Ventilation: Mask ventilation without difficulty LMA: LMA inserted LMA Size: 4.0 Number of attempts: 1 Airway Equipment and Method: bite block Placement Confirmation: positive ETCO2 Tube secured with: Tape Dental Injury: Teeth and Oropharynx as per pre-operative assessment

## 2014-06-13 NOTE — Op Note (Signed)
Preoperative diagnosis: Clinical stage 0 right breast cancer Postoperative diagnosis: Same as above Procedure: Right breast radioactive seed guided lumpectomy Surgeon: Dr. Serita Grammes Anesthesia: Gen. Estimated blood loss: Minimal Complications: None Drains: None Specimens: Right breast tissue marked with pain Needle count was correct at completion Disposition to recovery room in stable condition  Indications: This is a 68 year old female who presented with a right breast MRI abnormality which she was getting for screening. This has undergone a biopsy and was ductal carcinoma in situ. We discussed all of her options and decided to proceed with a right breast radioactive seed guided lumpectomy.  Procedure: After informed consent was obtained the patient first had a radioactive seed placed. I had these mammograms in the operating room. I confirmed the seed was present in the breast prior to beginning. She was placed under general anesthesia. She had sequential compression devices on her legs. She was also given cefazolin. She was then prepped and draped in the standard sterile surgical fashion. Surgical timeout was then performed.  I infiltrated Marcaine throughout the region. I used her old scar from an excisional biopsy. There was a fair amount of scar tissue in this region. I then used the neoprobe to guide the excision of the seed as well as the surrounding breast tissue with an attempt to get a clear margin. The seed was present in the specimen. I marked this with paint. Faxitron mammogram confirmed removal of the seed, the clip and these appeared to be in the center of the specimen. I then obtained hemostasis. I placed 2 clips deep and one clip in each position around the cavity. I then closed some of the cavity with 2-0 Vicryl. I then closed the dermis with 3-0 Vicryl and the skin with 5-0 Monocryl. I then placed exceed and Steri-Strips overlying this. A breast binder was placed. She  tolerated this well and was transferred to recovery room stable.

## 2014-06-13 NOTE — Anesthesia Preprocedure Evaluation (Addendum)
Anesthesia Evaluation  Patient identified by MRN, date of birth, ID band Patient awake    Reviewed: Allergy & Precautions, H&P , NPO status   Airway       Dental   Pulmonary neg pulmonary ROS, former smoker,          Cardiovascular negative cardio ROS      Neuro/Psych    GI/Hepatic negative GI ROS, Neg liver ROS,   Endo/Other  negative endocrine ROS  Renal/GU negative Renal ROS     Musculoskeletal  (+) Arthritis -,   Abdominal   Peds  Hematology   Anesthesia Other Findings   Reproductive/Obstetrics                          Anesthesia Physical Anesthesia Plan  ASA: I  Anesthesia Plan: General   Post-op Pain Management:    Induction: Intravenous  Airway Management Planned: LMA  Additional Equipment:   Intra-op Plan:   Post-operative Plan:   Informed Consent: I have reviewed the patients History and Physical, chart, labs and discussed the procedure including the risks, benefits and alternatives for the proposed anesthesia with the patient or authorized representative who has indicated his/her understanding and acceptance.   Dental advisory given  Plan Discussed with: CRNA and Surgeon  Anesthesia Plan Comments:        Anesthesia Quick Evaluation

## 2014-06-13 NOTE — Interval H&P Note (Signed)
History and Physical Interval Note:  06/13/2014 8:38 AM  Linda [name]  has presented today for surgery, with the diagnosis of right breast ductal carcinoma in-situ  The various methods of treatment have been discussed with the patient and family. After consideration of risks, benefits and other options for treatment, the patient has consented to  Procedure(s): RIGHT BREAST  RADIOACTIVE SEED GUIDED LUMPECTOMY (Right) as a surgical intervention .  The patient's history has been reviewed, patient examined, no change in status, stable for surgery.  I have reviewed the patient's chart and labs.  Questions were answered to the patient's satisfaction.     Britani Beattie

## 2014-06-13 NOTE — Discharge Instructions (Signed)
[facility] °Office Phone Number [phone] ° °BREAST BIOPSY/ PARTIAL MASTECTOMY: POST OP INSTRUCTIONS ° °Always review your discharge instruction sheet given to you by the facility where your surgery was performed. ° °IF YOU HAVE DISABILITY OR FAMILY LEAVE FORMS, YOU MUST BRING THEM TO THE OFFICE FOR PROCESSING.  DO NOT GIVE THEM TO YOUR DOCTOR. ° °1. A prescription for pain medication may be given to you upon discharge.  Take your pain medication as prescribed, if needed.  If narcotic pain medicine is not needed, then you may take acetaminophen (Tylenol), naprosyn (Alleve) or ibuprofen (Advil) as needed. °2. Take your usually prescribed medications unless otherwise directed °3. If you need a refill on your pain medication, please contact your pharmacy.  They will contact our office to request authorization.  Prescriptions will not be filled after 5pm or on week-ends. °4. You should eat very light the first 24 hours after surgery, such as soup, crackers, pudding, etc.  Resume your normal diet the day after surgery. °5. Most patients will experience some swelling and bruising in the breast.  Ice packs and a good support bra will help.  Wear the breast binder provided or a sports bra for 72 hours day and night.  After that wear a sports bra during the day until you return to the office. Swelling and bruising can take several days to resolve.  °6. It is common to experience some constipation if taking pain medication after surgery.  Increasing fluid intake and taking a stool softener will usually help or prevent this problem from occurring.  A mild laxative (Milk of Magnesia or Miralax) should be taken according to package directions if there are no bowel movements after 48 hours. °7. Unless discharge instructions indicate otherwise, you may remove your bandages 48 hours after surgery and you may shower at that time.  You may have steri-strips (small skin tapes) in place directly over the incision.   These strips should be left on the skin for 7-10 days and will come off on their own.  If your surgeon used skin glue on the incision, you may shower in 24 hours.  The glue will flake off over the next 2-3 weeks.  Any sutures or staples will be removed at the office during your follow-up visit. °8. ACTIVITIES:  You may resume regular daily activities (gradually increasing) beginning the next day.  Wearing a good support bra or sports bra minimizes pain and swelling.  You may have sexual intercourse when it is comfortable. °a. You may drive when you no longer are taking prescription pain medication, you can comfortably wear a seatbelt, and you can safely maneuver your car and apply brakes. °b. RETURN TO WORK:  ______________________________________________________________________________________ °9. You should see your doctor in the office for a follow-up appointment approximately two weeks after your surgery.  Your doctor’s nurse will typically make your follow-up appointment when she calls you with your pathology report.  Expect your pathology report 3-4 business days after your surgery.  You may call to check if you do not hear from us after three days. °10. OTHER INSTRUCTIONS: _______________________________________________________________________________________________ _____________________________________________________________________________________________________________________________________ °_____________________________________________________________________________________________________________________________________ °_____________________________________________________________________________________________________________________________________ ° °WHEN TO CALL DR [facility]: °1. Fever over 101.0 °2. Nausea and/or vomiting. °3. Extreme swelling or bruising. °4. Continued bleeding from incision. °5. Increased pain, redness, or drainage from the incision. ° °The clinic staff is available to  answer your questions during regular business hours.  Please don’t hesitate to call and ask to speak to one of the nurses for   clinical concerns.  If you have a medical emergency, go to the nearest emergency room or call 911.  A surgeon from [facility] is always on call at the hospital. ° °For further questions, please visit [email or web address] ° °Post Anesthesia Home Care Instructions ° °Activity: °Get plenty of rest for the remainder of the day. A responsible adult should stay with you for 24 hours following the procedure.  °For the next 24 hours, DO NOT: °-Drive a car °-Operate machinery °-Drink alcoholic beverages °-Take any medication unless instructed by your physician °-Make any legal decisions or sign important papers. ° °Meals: °Start with liquid foods such as gelatin or soup. Progress to regular foods as tolerated. Avoid greasy, spicy, heavy foods. If nausea and/or vomiting occur, drink only clear liquids until the nausea and/or vomiting subsides. Call your physician if vomiting continues. ° °Special Instructions/Symptoms: °Your throat may feel dry or sore from the anesthesia or the breathing tube placed in your throat during surgery. If this causes discomfort, gargle with warm salt water. The discomfort should disappear within 24 hours. ° °

## 2014-06-13 NOTE — H&P (View-Only) (Signed)
Patient ID: Linda [name], female   DOB: February 27, 1946, 68 y.o.   MRN: [id number]  Chief Complaint  Patient presents with  . Follow-up    breast    HPI Linda [name] is a 68 y.o. female.  Referred by Dr Everardo All HPI This is a 68 year old female who is otherwise healthy who presents with an MRI found breast lesion. She has a family history of breast cancer in her sister in her 30s who was treated with a bilateral mastectomy for stage 0 breast cancer. She states she had lobular carcinoma in situ found in her contralateral breast. She is doing well. She also had a mom in her 88s undergo a lumpectomy and radiation therapy. She has undergone genetic testing about 10 years ago and is negative for BRCA1 and BRCA2. She had no breast symptoms and has been getting occasional MRIs along with mammograms. She has had multiple procedures on her right breast including cyst aspirations, and excisional biopsy, a prior MRI guided biopsy, and a prior stereotactic guided biopsy. She has no nipple discharge or any masses she can identify. On her last mammogram done in March of 2015 this is negative. She has density category B. She has undergone an MRI that shows in the right breast a 7 x 6 x 5 mm oval mass the immediately lateral to this there are 2 3 mm foci of enhancement. The left breast is otherwise normal and there are no abnormal appearing lymph nodes. She underwent a biopsy that shows ductal carcinoma in situ of calcifications that is low to intermediate grade  per her [facility] pathology report the tumor cells are strongly positive for estrogen receptor as 70% and progesterone receptor at 80%. She comes in today to discuss her options.   Past Medical History  Diagnosis Date  . Hypercholesteremia   . Atrophic vaginitis   . Cervical dysplasia 1975  . Osteopenia 11/2011    T score -1.7 FRAX not calculated due to history of bisphosphonates and current Evista  . Breast cancer     right  . Anxiety   . Insomnia    . Arthritis   . Skin cancer 2006    basal cell of face    Past Surgical History  Procedure Laterality Date  . Appendectomy  1955  . Excision of basal cell ca from face  2006  . Breast biopsy  2010    FIBROCYSTIC CHANGES WITH CALCIFICATIONS, RADIAL SCLEROSING LESION WITH CALCIFICATIONS.  . Breast lumpectomy  1983    BENIGN  . Breast biopsy  2009  . Cataract extraction      Both eyes  . Knee surgery  2013    Arthroscopic  . Gynecologic cryosurgery    . Colposcopy    . Cataract surgery  2005  . Facelift  2014  . Facial cosmetic surgery      Family History  Problem Relation Age of Onset  . Breast cancer Mother     DIAGNOSED IN HER 80'S  . Hyperlipidemia Mother   . Osteoporosis Mother   . Breast cancer Sister     DIAGNOSED AT AGE 21  . Hypertension Maternal Grandmother   . Heart disease Maternal Grandfather   . Stroke Maternal Grandfather   . Diabetes Son     TYPE 1 DIABETES  . Colon cancer Paternal Grandfather   . Cancer Paternal Grandfather     colon    Social History History  Substance Use Topics  . Smoking status: Former Smoker  Quit date: 05/19/1974  . Smokeless tobacco: Not on file     Comment: smoked in college  . Alcohol Use: 2.5 oz/week    5 drink(s) per week    No Known Allergies  Current Outpatient Prescriptions  Medication Sig Dispense Refill  . ALPRAZolam (XANAX) 0.25 MG tablet Take 0.25 mg by mouth at bedtime as needed.      . Cholecalciferol (VITAMIN D PO) Take by mouth.      [name] Calcium (STOOL SOFTENER PO) Take by mouth.      . fluticasone (FLONASE) 50 MCG/ACT nasal spray Place into both nostrils daily.      . Loratadine (CLARITIN PO) Take by mouth.      . Multiple Vitamin (MULTIVITAMIN) capsule Take 1 capsule by mouth daily.        . Probiotic Product (PROBIOTIC PO) Take by mouth.      . raloxifene (EVISTA) 60 MG tablet Take 1 tablet (60 mg total) by mouth daily.  30 tablet  11  . simvastatin (ZOCOR) 20 MG tablet Take 20 mg by  mouth every evening.      . Zolpidem Tartrate (AMBIEN PO) Take by mouth.        No current facility-administered medications for this visit.    Review of Systems Review of Systems  Constitutional: Negative for fever, chills and unexpected weight change.  HENT: Negative for congestion, hearing loss, sore throat, trouble swallowing and voice change.   Eyes: Negative for visual disturbance.  Respiratory: Negative for cough and wheezing.   Cardiovascular: Negative for chest pain, palpitations and leg swelling.  Gastrointestinal: Negative for nausea, vomiting, abdominal pain, diarrhea, constipation, blood in stool, abdominal distention and anal bleeding.  Genitourinary: Negative for hematuria, vaginal bleeding and difficulty urinating.  Musculoskeletal: Negative for arthralgias.  Skin: Negative for rash and wound.  Neurological: Negative for seizures, syncope and headaches.  Hematological: Negative for adenopathy. Does not bruise/bleed easily.  Psychiatric/Behavioral: Negative for confusion.    Blood pressure 110/60, pulse 71, temperature 98.3 F (36.8 C), resp. rate 16, height _0  (1.727 m), weight 151 lb 3.2 oz (68.584 kg).  Physical Exam Physical Exam  Constitutional: She appears well-developed and well-nourished.  Neck: Neck supple.  Cardiovascular: Normal rate, regular rhythm and normal heart sounds.   Pulmonary/Chest: Effort normal and breath sounds normal. She has no wheezes. She has no rales. Right breast exhibits no inverted nipple, no mass, no nipple discharge, no skin change and no tenderness. Left breast exhibits no inverted nipple, no mass, no nipple discharge, no skin change and no tenderness.    Lymphadenopathy:    She has no cervical adenopathy.    She has no axillary adenopathy.       Right: No supraclavicular adenopathy present.       Left: No supraclavicular adenopathy present.    Data Reviewed EXAM:  BILATERAL BREAST MRI WITH AND WITHOUT CONTRAST    TECHNIQUE:  Multiplanar, multisequence MR images of both breasts were obtained  prior to and following the intravenous administration of 13m of  MultiHance.  THREE-DIMENSIONAL MR IMAGE RENDERING ON INDEPENDENT WORKSTATION:  Three-dimensional MR images were rendered by post-processing of the  original MR data on an independent workstation. The  three-dimensional MR images were interpreted, and findings are  reported in the following complete MRI report for this study. Three  dimensional images were evaluated at the independent DynaCad  workstation  COMPARISON: Previous exams  FINDINGS:  Breast composition: b. Scattered fibroglandular tissue.  Background parenchymal enhancement:  Moderate  Right breast: The previously biopsied linear enhancement in the  outer lower right breast is without significant change since 2007.  In the lower slightly inner right breast, there is a 7 x 6 x 5 mm  oval mass with plateau kinetics. Immediately lateral to this mass  are two 3 mm foci of enhancement. There is a complex parenchymal  pattern of the right breast with multiple similar appearing foci of  enhancement.  Left breast: There is a complex parenchymal pattern with multiple  similar foci of enhancement. No dominant mass.  Lymph nodes: No abnormal appearing lymph nodes.  Ancillary findings: None.  IMPRESSION:  A 7 x 6 x 5 mm mass with plateau kinetics in the inner lower right  breast and two immediately adjacent 3 mm foci of enhancement.  RECOMMENDATION:  Right breast ultrasound is recommended to determine if the mass seen  on MRI can be identified. If seen, an ultrasound-guided right breast  biopsy is recommended. If the mass is not seen sonographically, an  MRI guided biopsy is recommended.    Assessment    Clinical stage 0 right breast cancer Plan    Right breast radioactive seed guided lumpectomy      We discussed the staging and pathophysiology of breast cancer. We discussed all  of the different options for treatment for dcis.  We discussed possibility on final pathology that she might have invasive disease but this possibility is low. I do not think she needs a sentinel node biopsy but she understands we may need to go back to or and do this if she has invasive disease. We discussed the options for treatment of the breast cancer which included lumpectomy versus a mastectomy. We discussed the performance of the lumpectomy with seed placement. We discussed a 5-10% chance of a positive margin requiring reexcision in the operating room. We also discussed that she will need radiation therapy if she undergoes lumpectomy. We discussed the mastectomy and the postoperative care for that as well. She would be a candidate for nipple sparing mastectomy and reconstruction if she chose. We discussed that there is no difference in her survival whether she undergoes lumpectomy with radiation therapy versus a mastectomy.  We discussed the risks of operation including bleeding, infection, possible reoperation. She understands her further therapy will be based on what her stages at the time of her operation.      Linda [name] 05/25/2014, 3:38 PM

## 2014-06-14 ENCOUNTER — Encounter (HOSPITAL_BASED_OUTPATIENT_CLINIC_OR_DEPARTMENT_OTHER): Payer: Self-pay | Admitting: General Surgery

## 2014-06-14 ENCOUNTER — Telehealth (INDEPENDENT_AMBULATORY_CARE_PROVIDER_SITE_OTHER): Payer: Self-pay

## 2014-06-14 NOTE — Telephone Encounter (Signed)
Calling pt to see how she was doing after sx. Pt states that she has been doing good. Informed pt that I would get with Alisha to set up a po appt and then we would call her with that appt time and date. Pt verbalized understanding.

## 2014-06-15 ENCOUNTER — Other Ambulatory Visit (INDEPENDENT_AMBULATORY_CARE_PROVIDER_SITE_OTHER): Payer: Self-pay | Admitting: General Surgery

## 2014-06-15 DIAGNOSIS — C50311 Malignant neoplasm of lower-inner quadrant of right female breast: Secondary | ICD-10-CM

## 2014-06-15 NOTE — Telephone Encounter (Signed)
Called pt back to give her a f/u appt with Dr Donne Hazel for 8/6. Dr Donne Hazel placed referral in epic for medical oncology and he sent Dr Valere Dross a note in epic. Pt understands.

## 2014-06-19 ENCOUNTER — Telehealth: Payer: Self-pay | Admitting: *Deleted

## 2014-06-19 NOTE — Telephone Encounter (Signed)
Confirmed new pt appt with Dr. Lindi Adie on 07/10/14 2:30/3:00. Mailed new pt packet. Pt denies further needs.

## 2014-06-19 NOTE — Telephone Encounter (Signed)
CALLED PATIENT TO INFORM OF [facility] APPT. ON 06-29-14, SPOKE WITH PATIENT AND SHE IS AWARE OF THIS APPT.

## 2014-06-27 NOTE — Progress Notes (Signed)
)        Location of Breast Cancer: right breast  Histology per Pathology Report:  06/13/14 Diagnosis Breast, lumpectomy, Right with radioactive - see description - DUCTAL CARCINOMA IN SITU, SEE COMMENT. - IN SITU CARCINOMA IS 7 MM FROM NEAREST MARGIN (POSTERIOR).  Surgical Pathology Tissue Exam 04/19/2014  [facility]             RIGHT BREAST, NEEDLE CORE BIOPSY: Focal ductal carcinoma in situ, micropapillary and apocrine types. Sclerosing papilloma. Calcifications present.  COMMENT: The tumor appears low grade in the biopsy. Correlation with the resection specimen is recommended. Hormone receptor studies will be ordered and reported separately. Clinical History Right breast biopsy. The specimen was removed at 9:15 and put in formalin at 9:20 on 04/19/14. Nonpalpable 15 mm mass with intermediate suspicion for carcinoma.  Gross Description Received labeled with patient name, MRN and other identification details are 15 core needle biopsies ranging from 1 cm to 2.7 cm long, each with a diameter measuring up to 0.3 cm. The specimen is entirely submitted in A1-A5. Harrell Lark, M.D.,  [place] Date Ordered: 04/26/2014 Date Reported: 04/26/2014 Interpretation Prognostic Markers in Breast Cancer Clinical Information History: Ductal carcinoma in situ of the breast Pathologist: Dr. Claybon Jabs Specimen Number: [id number]; block A4 Assay Results  Estrogen Receptor Positive (70%, strong intensity) Progesterone Receptor Positive (80%, strong intensity)   Did patient present with symptoms (if so, please note symptoms) or was this found on screening mammography?: breast MRI following screening mammogram   Past/Anticipated interventions by surgeon, if any: Right breast lumpectomy 06/13/14, Dr Donne Hazel  Past/Anticipated interventions by medical oncology, if any: Chemotherapy , new consult appointment scheduled 07/10/14  Lymphedema issues, if any:  no  Pain issues, if any: "soreness, tenderness" of right breast, post op  SAFETY ISSUES:  Prior radiation? no  Pacemaker/ICD? no  Possible current pregnancy? no  Is the patient on methotrexate? No  Current Complaints / other details: Married, 8 grandchildren

## 2014-06-29 ENCOUNTER — Ambulatory Visit (INDEPENDENT_AMBULATORY_CARE_PROVIDER_SITE_OTHER): Payer: Commercial Managed Care - PPO | Admitting: General Surgery

## 2014-06-29 ENCOUNTER — Ambulatory Visit
Admission: RE | Admit: 2014-06-29 | Discharge: 2014-06-29 | Disposition: A | Discharge status: Home / Self Care | Payer: Commercial Managed Care - PPO | Source: Ambulatory Visit | Attending: Radiation Oncology | Admitting: Radiation Oncology

## 2014-06-29 ENCOUNTER — Encounter (INDEPENDENT_AMBULATORY_CARE_PROVIDER_SITE_OTHER): Payer: Self-pay | Admitting: General Surgery

## 2014-06-29 ENCOUNTER — Encounter: Payer: Self-pay | Admitting: Radiation Oncology

## 2014-06-29 VITALS — BP 110/64 | HR 70 | Resp 18 | Ht 68.0 in | Wt 151.0 lb

## 2014-06-29 VITALS — BP 110/64 | HR 70 | Temp 97.6°F | Resp 20 | Wt 151.0 lb

## 2014-06-29 DIAGNOSIS — C50311 Malignant neoplasm of lower-inner quadrant of right female breast: Secondary | ICD-10-CM

## 2014-06-29 DIAGNOSIS — D0590 Unspecified type of carcinoma in situ of unspecified breast: Secondary | ICD-10-CM | POA: Diagnosis not present

## 2014-06-29 DIAGNOSIS — Z9010 Acquired absence of unspecified breast and nipple: Secondary | ICD-10-CM | POA: Insufficient documentation

## 2014-06-29 DIAGNOSIS — C50319 Malignant neoplasm of lower-inner quadrant of unspecified female breast: Secondary | ICD-10-CM

## 2014-06-29 NOTE — Progress Notes (Signed)
CC: Dr. Everardo All, Dr. Rolm Bookbinder, Dr. Zack Seal   Followup note:  [name] returns today for review and discussion of possible adjuvant radiation therapy in the management of her stage 0 (Tis N0 M0) low-grade DCIS of the right breast .With a family history of breast cancer in her mother at age 68 and her sister at age 12, she underwent screening MRI and mammography. Her screening mammogram at the [facility] on 01/26/2014 was without evidence for malignancy. Breast MR on February 03, 2014 showed a 7 x 6 x 5 mm mass in the inner lower right breast and two immediately adjacent 3 mm foci of enhancement. She visited [facility] for consultation with Dr. Nadara Mustard- McNatt. Breast ultrasound was unremarkable. She had MRI guided biopsies on 04/19/2014. She was found to have focal DCIS, micropapillary and apocrine types, along with sclerosing papilloma and calcifications. The DCIS was ER positive at 70% and PR positive at 80%. She was seen by Dr. Donne Hazel who performed a right partial mastectomy on 06/13/2014. I reviewed her pathology she was found to have an area of residual DCIS measuring 0.2 cm with the closest margin being 0.7 cm. The entire previous biopsy site was included.  Physical examination: Alert and oriented. Filed Vitals:   06/29/14 1246  BP: 110/64  Pulse: 70  Temp: 97.6 F (36.4 C)  Resp: 20   She was examined by Dr. Donne Hazel earlier today and is not reexamined.  Impression: Stage 0 (Tis N0 M0) low-grade DCIS of the right breast. We again discussed the fact that she has "good risk" disease, and that radiation therapy in this setting does not affect overall survival, but has been shown to decrease risk for local recurrence. Preliminary data from the RTOG 9804 protocol showed a significant difference in local recurrence at 7 years (7% without radiation therapy and 1% with radiation therapy). Risk for local recurrence is expected to be higher and protracted with longer follow up.   Her management options include close followup without radiation therapy, or hypofractionated right breast radiation over 3 weeks. By decreasing local failure with radiation therapy, she would be less likely to require a mastectomy  because of her small breast size. I performed an "e-prognosis" calculation for her life expectancy, and she has a 92% life expectancy at 10 years. Lastly, we discussed the potential acute and late toxicities of radiation therapy.  Plan: She will think things over and call me if she wants to consider right breast irradiation over a period of 3 weeks.

## 2014-06-29 NOTE — Progress Notes (Signed)
Please see the Nurse Progress Note in the MD Initial Consult Encounter for this patient. 

## 2014-06-29 NOTE — Progress Notes (Signed)
Subjective:     Patient ID: Bristal Steffy, female   DOB: [birth year], 68 y.o.   MRN: [id number]  HPI This is a 68 year old female who has recently undergone a right breast radioactive seed guided lumpectomy. Her pathology returns as a 2 mm ductal carcinoma in situ that is well clear of all the margins. Previously this was a hormone receptor positive. She has done well postoperatively he returns today without any complaints.  Review of Systems     Objective:   Physical Exam Healing right breast periareolar incision without any infection    Assessment:     Stage 0 right breast cancer     Plan:     She's done very well. I released her to full activity today. I will plan on following her up at one year. She is going to see Dr. Valere Dross today for consideration of radiotherapy but I think that this may actually even be able to be omitted. She will discuss this with him today. She is also due to see Dr. Lindi Adie in the next week or two. This is to discuss antiestrogen therapy. I will be happy to split every 6 months visits with him.

## 2014-06-29 NOTE — Patient Instructions (Signed)

## 2014-07-07 ENCOUNTER — Encounter: Payer: Self-pay | Admitting: *Deleted

## 2014-07-07 NOTE — Progress Notes (Signed)
Completed chart, added to spreadsheet and placed in MD's box.

## 2014-07-10 ENCOUNTER — Ambulatory Visit (HOSPITAL_BASED_OUTPATIENT_CLINIC_OR_DEPARTMENT_OTHER): Payer: Commercial Managed Care - PPO | Admitting: Hematology and Oncology

## 2014-07-10 ENCOUNTER — Ambulatory Visit: Payer: Commercial Managed Care - PPO

## 2014-07-10 ENCOUNTER — Encounter: Payer: Self-pay | Admitting: Hematology and Oncology

## 2014-07-10 ENCOUNTER — Telehealth: Payer: Self-pay | Admitting: Hematology and Oncology

## 2014-07-10 ENCOUNTER — Other Ambulatory Visit: Payer: Commercial Managed Care - PPO

## 2014-07-10 ENCOUNTER — Encounter: Payer: Self-pay | Admitting: Radiation Oncology

## 2014-07-10 VITALS — BP 117/72 | HR 68 | Temp 98.5°F | Resp 20 | Ht 68.0 in | Wt 151.5 lb

## 2014-07-10 DIAGNOSIS — C50319 Malignant neoplasm of lower-inner quadrant of unspecified female breast: Secondary | ICD-10-CM

## 2014-07-10 DIAGNOSIS — M899 Disorder of bone, unspecified: Secondary | ICD-10-CM

## 2014-07-10 DIAGNOSIS — Z803 Family history of malignant neoplasm of breast: Secondary | ICD-10-CM

## 2014-07-10 DIAGNOSIS — M8580 Other specified disorders of bone density and structure, unspecified site: Secondary | ICD-10-CM

## 2014-07-10 DIAGNOSIS — C50311 Malignant neoplasm of lower-inner quadrant of right female breast: Secondary | ICD-10-CM

## 2014-07-10 DIAGNOSIS — M949 Disorder of cartilage, unspecified: Secondary | ICD-10-CM

## 2014-07-10 MED ORDER — IBANDRONATE SODIUM 150 MG PO TABS: 150.0000 mg | ORAL_TABLET | ORAL | Status: DC

## 2014-07-10 NOTE — Progress Notes (Signed)
CC: Dr. Rolm Bookbinder  Ms. Nored called me this afternoon, and she would like to proceed with radiation therapy for her DCIS. She would be a candidate for hypo-fractionated radiation therapy in 16 fractions. I'll have her return for simulation/treatment planning this week.

## 2014-07-10 NOTE — Progress Notes (Signed)
Checked in new pt with no financial concerns. °

## 2014-07-10 NOTE — Assessment & Plan Note (Addendum)
1. DCIS right breast: I discussed with her the details of the pathology report including the size of the DCIS along with the estrogen receptor and progesterone receptor status and their significance. B. ER/PR positive I recommended antiestrogen therapy with aromatase inhibitors.  2. Patient wanted to know if there is still a requirement for radiation therapy. I encouraged her to discuss this with the radiation oncologist to determine the pros and cons of doing radiation. Patient was interested to know about Oncotype DX for DCIS. I discussed this with her briefly but encouraged her to discuss it with radiation oncology.  3. We discussed the risks and benefits of anti-estrogen therapy with aromatase inhibitors. These include but not limited to insomnia, hot flashes, mood changes, vaginal dryness, bone density loss, and weight gain. Although rare, serious side effects including endometrial cancer, risk of blood clots were also discussed. We strongly believe that the benefits far outweigh the risks. Patient understands these risks and consented to starting treatment. Planned treatment duration is 5 years.

## 2014-07-10 NOTE — Progress Notes (Signed)
[place] CONSULT NOTE  Patient Care Team: Parke Poisson, MD as PCP - General (Internal Medicine)  CHIEF COMPLAINTS/PURPOSE OF CONSULTATION:  Newly diagnosed right breast DCIS  HISTORY OF PRESENTING ILLNESS:  Linda [name] 68 y.o. Caucasian female is here because of recent diagnosis of right breast DCIS. Patient has an extensive family history of breast cancer and hence she was being watched with annual mammograms as well as every other year breast MRIs. In January 2015 general he mammogram that did not show any abnormalities. In March 2015 she underwent bilateral MRI of the breasts that revealed a 7 mm focus of abnormality in the right breast. She then subsequently went on to undergo MR direct guided biopsy on 04/19/2014 that revealed DCIS. She then went to see Dr. Donne Hazel who performed lumpectomy on 06/13/2014 that revealed a 7 mm focus of DCIS with negative margins ER 70% PR 80% positive. She is here today to discuss the risks and benefits of adjuvant hormonal therapy.  Patient does not have any new complaints. Continues to have mild to moderate discomfort beneath the right breast and she requests that I not examine the right breast today because of tenderness.  I reviewed her records extensively and collaborated the history with the patient.  MEDICAL HISTORY:  Past Medical History  Diagnosis Date  . Hypercholesteremia   . Atrophic vaginitis   . Cervical dysplasia 1975  . Osteopenia 11/2011    T score -1.7 FRAX not calculated due to history of bisphosphonates and current Evista  . Breast cancer     right  . Anxiety   . Insomnia   . Arthritis   . Skin cancer 2006    basal cell of face  . Seasonal allergies     SURGICAL HISTORY: Past Surgical History  Procedure Laterality Date  . Appendectomy  1955  . Excision of basal cell ca from face  2006  . Cataract extraction      Both eyes  . Knee surgery  2013    Arthroscopic-rt  . Gynecologic cryosurgery    .  Colposcopy    . Cataract surgery  2005  . Facelift  2014  . Facial cosmetic surgery    . Breast biopsy  2010    rt bx  . Breast lumpectomy  1983    BENIGN-rt  . Breast biopsy  2009    rt  . Colonoscopy    . Breast lumpectomy with radioactive seed localization Right 06/13/2014    Procedure: RIGHT BREAST  RADIOACTIVE SEED GUIDED LUMPECTOMY;  Surgeon: Rolm Bookbinder, MD;  Location: [facility];  Service: General;  Laterality: Right;    SOCIAL HISTORY: History   Social History  . Marital Status: Married    Spouse Name: N/A    Number of Children: N/A  . Years of Education: N/A   Occupational History  . Not on file.   Social History Main Topics  . Smoking status: Former Smoker    Quit date: 05/19/1974  . Smokeless tobacco: Not on file     Comment: smoked in college  . Alcohol Use: 2.5 oz/week    5 drink(s) per week  . Drug Use: No  . Sexual Activity: Yes    Birth Control/ Protection: Post-menopausal     Comment: G4 P4    Other Topics Concern  . Not on file   Social History Narrative  . No narrative on file    FAMILY HISTORY: Family History  Problem Relation Age of  Onset  . Breast cancer Mother     DIAGNOSED IN HER 80'S  . Hyperlipidemia Mother   . Osteoporosis Mother   . Breast cancer Sister     DIAGNOSED AT AGE 3  . Hypertension Maternal Grandmother   . Heart disease Maternal Grandfather   . Stroke Maternal Grandfather   . Diabetes Son     TYPE 1 DIABETES  . Colon cancer Paternal Grandfather   . Cancer Paternal Grandfather     colon    ALLERGIES:  has No Known Allergies.  MEDICATIONS:  Current Outpatient Prescriptions  Medication Sig Dispense Refill  . ALPRAZolam (XANAX) 0.25 MG tablet Take 0.25 mg by mouth at bedtime as needed.      Marland Kitchen aspirin 81 MG tablet Take 81 mg by mouth daily.      . Cholecalciferol (VITAMIN D PO) Take by mouth.      [name] Calcium (STOOL SOFTENER PO) Take by mouth.      . fexofenadine (ALLEGRA) 60 MG  tablet Take 60 mg by mouth daily.      . fluticasone (FLONASE) 50 MCG/ACT nasal spray Place into both nostrils daily.      . Multiple Vitamin (MULTIVITAMIN) capsule Take 1 capsule by mouth daily.        . Probiotic Product (PROBIOTIC PO) Take by mouth.      . simvastatin (ZOCOR) 20 MG tablet Take 20 mg by mouth every evening.      . Zolpidem Tartrate (AMBIEN PO) Take by mouth.       . ibandronate (BONIVA) 150 MG tablet Take 1 tablet (150 mg total) by mouth every 30 (thirty) days. Take in the morning with a full glass of water, on an empty stomach, and do not take anything else by mouth or lie down for the next 30 min.  3 tablet  6  . raloxifene (EVISTA) 60 MG tablet Take 1 tablet (60 mg total) by mouth daily.  30 tablet  11   No current facility-administered medications for this visit.    REVIEW OF SYSTEMS:   Constitutional: Denies fevers, chills or abnormal night sweats Eyes: Denies blurriness of vision, double vision or watery eyes Ears, nose, mouth, throat, and face: Denies mucositis or sore throat Respiratory: Denies cough, dyspnea or wheezes Cardiovascular: Denies palpitation, chest discomfort or lower extremity swelling Gastrointestinal:  Denies nausea, heartburn or change in bowel habits Skin: Denies abnormal skin rashes Lymphatics: Denies new lymphadenopathy or easy bruising Neurological:Denies numbness, tingling or new weaknesses Behavioral/Psych: Mood is stable, no new changes  All other systems were reviewed with the patient and are negative.  PHYSICAL EXAMINATION: ECOG PERFORMANCE STATUS: 1 - Symptomatic but completely ambulatory  Filed Vitals:   07/10/14 1517  BP: 117/72  Pulse: 68  Temp: 98.5 F (36.9 C)  Resp: 20   Filed Weights   07/10/14 1517  Weight: 151 lb 8 oz (68.72 kg)    GENERAL:alert, no distress and comfortable SKIN: skin color, texture, turgor are normal, no rashes or significant lesions EYES: normal, conjunctiva are pink and non-injected, sclera  clear OROPHARYNX:no exudate, no erythema and lips, buccal mucosa, and tongue normal  NECK: supple, thyroid normal size, non-tender, without nodularity LYMPH:  no palpable lymphadenopathy in the cervical, axillary or inguinal LUNGS: clear to auscultation and percussion with normal breathing effort HEART: regular rate & rhythm and no murmurs and no lower extremity edema ABDOMEN:abdomen soft, non-tender and normal bowel sounds Musculoskeletal:no cyanosis of digits and no clubbing  PSYCH: alert &  oriented x 3 with fluent speech NEURO: no focal motor/sensory deficits BREAST:  LABORATORY DATA:  I have reviewed the data as listed Lab Results  Component Value Date   WBC 6.5 06/08/2014   HGB 13.4 06/08/2014   HCT 40.1 06/08/2014   MCV 97.6 06/08/2014   PLT 285 06/08/2014   Lab Results  Component Value Date   NA 141 06/08/2014   K 5.1 06/08/2014   CL 102 06/08/2014   CO2 26 06/08/2014    RADIOGRAPHIC STUDIES: I have personally reviewed the radiological reports and agreed with the findings in the report.  ASSESSMENT AND PLAN:  Osteopenia Since the patient will start antiestrogen therapy in the next few months I recommended that she should start treatment for osteopenia with Boniva 150 mg by mouth daily. I encouraged her that she needs to take this tablet sitting up and with lot of water and not to lie down after taking it. If she needs any teeth extractions she needs to inform us because of the risk of osteonecrosis of the jaw. Patient takes vitamin D supplements but wishes to take milk cheese and other milk products for the calcium instead of oral calcium supplementation.  Malignant neoplasm of lower-inner quadrant of female breast 1. DCIS right breast: I discussed with her the details of the pathology report including the size of the DCIS along with the estrogen receptor and progesterone receptor status and their significance. B. ER/PR positive I recommended antiestrogen therapy with aromatase  inhibitors.  2. Patient wanted to know if there is still a requirement for radiation therapy. I encouraged her to discuss this with the radiation oncologist to determine the pros and cons of doing radiation. Patient was interested to know about Oncotype DX for DCIS. I discussed this with her briefly but encouraged her to discuss it with radiation oncology.  3. We discussed the risks and benefits of anti-estrogen therapy with aromatase inhibitors. These include but not limited to insomnia, hot flashes, mood changes, vaginal dryness, bone density loss, and weight gain. Although rare, serious side effects including endometrial cancer, risk of blood clots were also discussed. We strongly believe that the benefits far outweigh the risks. Patient understands these risks and consented to starting treatment. Planned treatment duration is 5 years.  4. extensive family history of breast cancer: I do not believe she needs genetic counseling based on NCCM criteria.  5. Her surveillance would include annual mammograms as well as every other year breast MRIs as they have been more useful in detecting her current problems with DCIS more than the mammograms. Along with this annual breast exams will also be done.  All questions were answered. The patient knows to call the clinic with any problems, questions or concerns. I spent 40 minutes counseling the patient face to face. The total time spent in the appointment was 60 minutes and more than 50% was on counseling.     Rulon Eisenmenger, MD 07/10/2014 4:45 PM

## 2014-07-10 NOTE — Assessment & Plan Note (Signed)
Since the patient will start antiestrogen therapy in the next few months I recommended that she should start treatment for osteopenia with Boniva 150 mg by mouth daily. I encouraged her that she needs to take this tablet sitting up and with lot of water and not to lie down after taking it. If she needs any teeth extractions she needs to inform us because of the risk of osteonecrosis of the jaw. Patient takes vitamin D supplements but wishes to take milk cheese and other milk products for the calcium instead of oral calcium supplementation.

## 2014-07-10 NOTE — Telephone Encounter (Signed)
per pof to sch appt-gave pt copy of appt

## 2014-07-11 ENCOUNTER — Other Ambulatory Visit: Payer: Self-pay

## 2014-07-11 DIAGNOSIS — M8580 Other specified disorders of bone density and structure, unspecified site: Secondary | ICD-10-CM

## 2014-07-11 MED ORDER — KETOROLAC TROMETHAMINE 30 MG/ML IJ SOLN
INTRAMUSCULAR | Status: AC
  Filled 2014-07-11: qty 1

## 2014-07-11 MED ORDER — IBANDRONATE SODIUM 150 MG PO TABS: 150.0000 mg | ORAL_TABLET | ORAL | Status: DC

## 2014-07-11 NOTE — Telephone Encounter (Signed)
Prescription printed in error - refilled - sent escrip.

## 2014-07-17 ENCOUNTER — Ambulatory Visit
Admission: RE | Admit: 2014-07-17 | Discharge: 2014-07-17 | Disposition: A | Discharge status: Home / Self Care | Payer: Commercial Managed Care - PPO | Source: Ambulatory Visit | Attending: Radiation Oncology | Admitting: Radiation Oncology

## 2014-07-17 DIAGNOSIS — Z510 Encounter for antineoplastic radiation therapy: Secondary | ICD-10-CM | POA: Diagnosis not present

## 2014-07-17 DIAGNOSIS — C50311 Malignant neoplasm of lower-inner quadrant of right female breast: Secondary | ICD-10-CM

## 2014-07-17 NOTE — Progress Notes (Signed)
Complex simulation/treatment planning note: The patient was taken to the CT simulator. A Vac lock immobilization device was constructed. Her right breast borders were marked with radiopaque wires. Her periareolar wound was also marked. The CT data set was sent to the planning system right contoured her tumor bed. She set up to medial and lateral right breast tangents. 2 sets of unique MLCs are designed to conform the field.  I'm prescribing 4256 cGy in 16 sessions utilizing 6 MV photons. She is now ready for 3-D simulation.

## 2014-07-18 ENCOUNTER — Encounter: Payer: Self-pay | Admitting: Radiation Oncology

## 2014-07-18 DIAGNOSIS — Z510 Encounter for antineoplastic radiation therapy: Secondary | ICD-10-CM | POA: Diagnosis not present

## 2014-07-18 NOTE — Progress Notes (Signed)
3-D simulation note: The patient underwent 3-D simulation for treatment of her carcinoma the right breast. She is setup to medial and lateral fields. Each field has 1 unique multileaf collimator and also electronic compensator for a total of four complex treatment devices. Dose volume histograms were obtained for the target structures and avoidance structures including the lungs and heart. We met our departmental guidelines. I prescribing 5000 cGy 25 sessions utilizing 6 MV photons. No boost.

## 2014-07-20 ENCOUNTER — Encounter: Payer: Self-pay | Admitting: Hematology and Oncology

## 2014-07-24 ENCOUNTER — Ambulatory Visit: Payer: Commercial Managed Care - PPO | Admitting: Radiation Oncology

## 2014-07-25 ENCOUNTER — Ambulatory Visit: Payer: Commercial Managed Care - PPO

## 2014-07-25 DIAGNOSIS — Z510 Encounter for antineoplastic radiation therapy: Secondary | ICD-10-CM | POA: Diagnosis not present

## 2014-07-26 ENCOUNTER — Ambulatory Visit: Payer: Commercial Managed Care - PPO

## 2014-07-27 ENCOUNTER — Ambulatory Visit: Payer: Commercial Managed Care - PPO

## 2014-07-28 ENCOUNTER — Ambulatory Visit: Payer: Commercial Managed Care - PPO

## 2014-08-01 ENCOUNTER — Ambulatory Visit: Payer: Commercial Managed Care - PPO

## 2014-08-01 ENCOUNTER — Ambulatory Visit
Admission: RE | Admit: 2014-08-01 | Discharge: 2014-08-01 | Disposition: A | Discharge status: Home / Self Care | Payer: Commercial Managed Care - PPO | Source: Ambulatory Visit | Attending: Radiation Oncology | Admitting: Radiation Oncology

## 2014-08-01 DIAGNOSIS — C50311 Malignant neoplasm of lower-inner quadrant of right female breast: Secondary | ICD-10-CM

## 2014-08-01 DIAGNOSIS — Z510 Encounter for antineoplastic radiation therapy: Secondary | ICD-10-CM | POA: Diagnosis not present

## 2014-08-01 NOTE — Progress Notes (Signed)
Simulation verification note: The patient underwent simulation verification for treatment to her right breast. Her isocenter is in good position and the multileaf collimators contoured the treatment volume appropriately.

## 2014-08-02 ENCOUNTER — Ambulatory Visit
Admission: RE | Admit: 2014-08-02 | Discharge: 2014-08-02 | Disposition: A | Discharge status: Home / Self Care | Payer: Commercial Managed Care - PPO | Source: Ambulatory Visit | Attending: Radiation Oncology | Admitting: Radiation Oncology

## 2014-08-02 ENCOUNTER — Ambulatory Visit: Payer: Commercial Managed Care - PPO

## 2014-08-02 DIAGNOSIS — C50311 Malignant neoplasm of lower-inner quadrant of right female breast: Secondary | ICD-10-CM

## 2014-08-02 DIAGNOSIS — Z510 Encounter for antineoplastic radiation therapy: Secondary | ICD-10-CM | POA: Diagnosis not present

## 2014-08-02 MED ORDER — ALRA NON-METALLIC DEODORANT (RAD-ONC)
1.0000 "application " | Freq: Once | TOPICAL | Status: AC
  Administered 2014-08-02: 1 via TOPICAL

## 2014-08-02 MED ORDER — RADIAPLEXRX EX GEL
Freq: Once | CUTANEOUS | Status: AC
  Administered 2014-08-02: 14:00:00 via TOPICAL

## 2014-08-02 NOTE — Progress Notes (Signed)
Patient education completed with patient. Gave her "Radiation and You" booklet with all pertinent information marked and discussed, re: fatigue, skin irritation/care, nutrition, pain. Gave pt Radiaplex, Alra with instructions for proper use. Pt verbalized understanding.

## 2014-08-03 ENCOUNTER — Ambulatory Visit
Admission: RE | Admit: 2014-08-03 | Discharge: 2014-08-03 | Disposition: A | Discharge status: Home / Self Care | Payer: Commercial Managed Care - PPO | Source: Ambulatory Visit | Attending: Radiation Oncology | Admitting: Radiation Oncology

## 2014-08-03 DIAGNOSIS — Z510 Encounter for antineoplastic radiation therapy: Secondary | ICD-10-CM | POA: Diagnosis not present

## 2014-08-04 ENCOUNTER — Ambulatory Visit
Admission: RE | Admit: 2014-08-04 | Discharge: 2014-08-04 | Disposition: A | Discharge status: Home / Self Care | Payer: Commercial Managed Care - PPO | Source: Ambulatory Visit | Attending: Radiation Oncology | Admitting: Radiation Oncology

## 2014-08-04 DIAGNOSIS — Z510 Encounter for antineoplastic radiation therapy: Secondary | ICD-10-CM | POA: Diagnosis not present

## 2014-08-07 ENCOUNTER — Ambulatory Visit
Admission: RE | Admit: 2014-08-07 | Discharge: 2014-08-07 | Disposition: A | Discharge status: Home / Self Care | Payer: Commercial Managed Care - PPO | Source: Ambulatory Visit | Attending: Radiation Oncology | Admitting: Radiation Oncology

## 2014-08-07 ENCOUNTER — Encounter: Payer: Self-pay | Admitting: Radiation Oncology

## 2014-08-07 VITALS — BP 114/63 | HR 72 | Temp 98.2°F | Resp 20 | Wt 151.9 lb

## 2014-08-07 DIAGNOSIS — C50311 Malignant neoplasm of lower-inner quadrant of right female breast: Secondary | ICD-10-CM

## 2014-08-07 DIAGNOSIS — Z510 Encounter for antineoplastic radiation therapy: Secondary | ICD-10-CM | POA: Diagnosis not present

## 2014-08-07 NOTE — Progress Notes (Signed)
Patient denies pain, fatigue, loss of appetite. She is applying Radiaplex to right breast treatment area.

## 2014-08-07 NOTE — Progress Notes (Signed)
Weekly Management Note:  Site: Right breast Current Dose:  1064  cGy Projected Dose: 2979  CGy, no boost  Narrative: The patient is seen today for routine under treatment assessment. CBCT/MVCT images/port films were reviewed. The chart was reviewed.   She is without complaints today. She has Radioplex gel to use when necessary.  Physical Examination:  Filed Vitals:   08/07/14 1318  BP: 114/63  Pulse: 72  Temp: 98.2 F (36.8 C)  Resp: 20  .  Weight: 151 lb 14.4 oz (68.901 kg). No skin changes.  Impression: Tolerating radiation therapy well.  Plan: Continue radiation therapy as planned.

## 2014-08-08 ENCOUNTER — Ambulatory Visit
Admission: RE | Admit: 2014-08-08 | Discharge: 2014-08-08 | Disposition: A | Discharge status: Home / Self Care | Payer: Commercial Managed Care - PPO | Source: Ambulatory Visit | Attending: Radiation Oncology | Admitting: Radiation Oncology

## 2014-08-08 DIAGNOSIS — Z510 Encounter for antineoplastic radiation therapy: Secondary | ICD-10-CM | POA: Diagnosis not present

## 2014-08-09 ENCOUNTER — Telehealth: Payer: Self-pay | Admitting: Hematology and Oncology

## 2014-08-09 ENCOUNTER — Ambulatory Visit
Admission: RE | Admit: 2014-08-09 | Discharge: 2014-08-09 | Disposition: A | Discharge status: Home / Self Care | Payer: Commercial Managed Care - PPO | Source: Ambulatory Visit | Attending: Radiation Oncology | Admitting: Radiation Oncology

## 2014-08-09 DIAGNOSIS — Z510 Encounter for antineoplastic radiation therapy: Secondary | ICD-10-CM | POA: Diagnosis not present

## 2014-08-09 NOTE — Telephone Encounter (Signed)
, °

## 2014-08-10 ENCOUNTER — Ambulatory Visit
Admission: RE | Admit: 2014-08-10 | Discharge: 2014-08-10 | Disposition: A | Discharge status: Home / Self Care | Payer: Commercial Managed Care - PPO | Source: Ambulatory Visit | Attending: Radiation Oncology | Admitting: Radiation Oncology

## 2014-08-10 DIAGNOSIS — Z510 Encounter for antineoplastic radiation therapy: Secondary | ICD-10-CM | POA: Diagnosis not present

## 2014-08-11 ENCOUNTER — Ambulatory Visit
Admission: RE | Admit: 2014-08-11 | Discharge: 2014-08-11 | Disposition: A | Discharge status: Home / Self Care | Payer: Commercial Managed Care - PPO | Source: Ambulatory Visit | Attending: Radiation Oncology | Admitting: Radiation Oncology

## 2014-08-11 DIAGNOSIS — Z510 Encounter for antineoplastic radiation therapy: Secondary | ICD-10-CM | POA: Diagnosis not present

## 2014-08-14 ENCOUNTER — Ambulatory Visit
Admission: RE | Admit: 2014-08-14 | Discharge: 2014-08-14 | Disposition: A | Discharge status: Home / Self Care | Payer: Commercial Managed Care - PPO | Source: Ambulatory Visit | Attending: Radiation Oncology | Admitting: Radiation Oncology

## 2014-08-14 VITALS — BP 111/71 | HR 65 | Temp 97.6°F | Wt 152.1 lb

## 2014-08-14 DIAGNOSIS — Z510 Encounter for antineoplastic radiation therapy: Secondary | ICD-10-CM | POA: Diagnosis not present

## 2014-08-14 DIAGNOSIS — C50311 Malignant neoplasm of lower-inner quadrant of right female breast: Secondary | ICD-10-CM

## 2014-08-14 NOTE — Progress Notes (Signed)
Weekly assessment of radiation to right breast.Completed 9 of 16 treatments.Denies pain.No visible changes to skin.

## 2014-08-14 NOTE — Progress Notes (Signed)
Weekly Management Note:  Site: Right breast Current Dose:  2394  cGy Projected Dose: 4256  cGy  Narrative: The patient is seen today for routine under treatment assessment. CBCT/MVCT images/port films were reviewed. The chart was reviewed.   She is without complaints today. She has Radioplex gel to use when necessary.  Physical Examination:  Filed Vitals:   08/14/14 1338  BP: 111/71  Pulse: 65  Temp: 97.6 F (36.4 C)  .  Weight: 152 lb 1.6 oz (68.992 kg). There are no significant skin changes along the right breast. No desquamation.  Impression: Tolerating radiation therapy well.  Plan: Continue radiation therapy as planned.

## 2014-08-15 ENCOUNTER — Ambulatory Visit
Admission: RE | Admit: 2014-08-15 | Discharge: 2014-08-15 | Disposition: A | Discharge status: Home / Self Care | Payer: Commercial Managed Care - PPO | Source: Ambulatory Visit | Attending: Radiation Oncology | Admitting: Radiation Oncology

## 2014-08-15 DIAGNOSIS — Z510 Encounter for antineoplastic radiation therapy: Secondary | ICD-10-CM | POA: Diagnosis not present

## 2014-08-16 ENCOUNTER — Ambulatory Visit
Admission: RE | Admit: 2014-08-16 | Discharge: 2014-08-16 | Disposition: A | Discharge status: Home / Self Care | Payer: Commercial Managed Care - PPO | Source: Ambulatory Visit | Attending: Radiation Oncology | Admitting: Radiation Oncology

## 2014-08-16 DIAGNOSIS — Z510 Encounter for antineoplastic radiation therapy: Secondary | ICD-10-CM | POA: Diagnosis not present

## 2014-08-17 ENCOUNTER — Ambulatory Visit
Admission: RE | Admit: 2014-08-17 | Discharge: 2014-08-17 | Disposition: A | Discharge status: Home / Self Care | Payer: Commercial Managed Care - PPO | Source: Ambulatory Visit | Attending: Radiation Oncology | Admitting: Radiation Oncology

## 2014-08-17 DIAGNOSIS — Z9089 Acquired absence of other organs: Secondary | ICD-10-CM | POA: Insufficient documentation

## 2014-08-17 DIAGNOSIS — F411 Generalized anxiety disorder: Secondary | ICD-10-CM | POA: Insufficient documentation

## 2014-08-17 DIAGNOSIS — G4700 Insomnia, unspecified: Secondary | ICD-10-CM | POA: Diagnosis not present

## 2014-08-17 DIAGNOSIS — M949 Disorder of cartilage, unspecified: Secondary | ICD-10-CM | POA: Diagnosis not present

## 2014-08-17 DIAGNOSIS — D0590 Unspecified type of carcinoma in situ of unspecified breast: Secondary | ICD-10-CM | POA: Diagnosis not present

## 2014-08-17 DIAGNOSIS — Z803 Family history of malignant neoplasm of breast: Secondary | ICD-10-CM | POA: Diagnosis not present

## 2014-08-17 DIAGNOSIS — Z170 Estrogen receptor positive status [ER+]: Secondary | ICD-10-CM | POA: Insufficient documentation

## 2014-08-17 DIAGNOSIS — Z510 Encounter for antineoplastic radiation therapy: Secondary | ICD-10-CM | POA: Diagnosis not present

## 2014-08-17 DIAGNOSIS — Z87891 Personal history of nicotine dependence: Secondary | ICD-10-CM | POA: Insufficient documentation

## 2014-08-17 DIAGNOSIS — E7800 Pure hypercholesterolemia, unspecified: Secondary | ICD-10-CM | POA: Diagnosis not present

## 2014-08-17 DIAGNOSIS — M899 Disorder of bone, unspecified: Secondary | ICD-10-CM | POA: Insufficient documentation

## 2014-08-18 ENCOUNTER — Ambulatory Visit: Payer: Commercial Managed Care - PPO

## 2014-08-21 ENCOUNTER — Encounter: Payer: Self-pay | Admitting: Radiation Oncology

## 2014-08-21 ENCOUNTER — Ambulatory Visit
Admission: RE | Admit: 2014-08-21 | Discharge: 2014-08-21 | Disposition: A | Discharge status: Home / Self Care | Payer: Commercial Managed Care - PPO | Source: Ambulatory Visit | Attending: Radiation Oncology | Admitting: Radiation Oncology

## 2014-08-21 VITALS — BP 100/73 | HR 69 | Temp 97.9°F | Resp 20 | Wt 149.8 lb

## 2014-08-21 DIAGNOSIS — C50311 Malignant neoplasm of lower-inner quadrant of right female breast: Secondary | ICD-10-CM

## 2014-08-21 DIAGNOSIS — Z510 Encounter for antineoplastic radiation therapy: Secondary | ICD-10-CM | POA: Diagnosis not present

## 2014-08-21 NOTE — Progress Notes (Signed)
Patient denies pain but states she occasionally has shooting pain in right breast. She is applying Radiaplex lotion, has mild hyperpigmentation, some radiation dermatitis; she denies itching. Advised she may apply Cortisone cream 1% prn for itching. Pt will complete treatment on Thurs.

## 2014-08-21 NOTE — Progress Notes (Signed)
Weekly Management Note:  Site: Right breast Current Dose:  3458  cGy Projected Dose: 4256  cGy  Narrative: The patient is seen today for routine under treatment assessment. CBCT/MVCT images/port films were reviewed. The chart was reviewed.   She is without complaints today except for occasional shooting pain within the right breast. She uses Radioplex gel. No pruritus.  Physical Examination:  Filed Vitals:   08/21/14 1331  BP: 100/73  Pulse: 69  Temp: 97.9 F (36.6 C)  Resp: 20  .  Weight: 149 lb 12.8 oz (67.949 kg). There is mild erythema which is more papular along the upper inner quadrant of the right breast. No areas of desquamation. She has 3 more treatments she will finish this Thursday.  Impression: Tolerating radiation therapy well. She'll finish this Thursday.  Plan: Continue radiation therapy as planned. One-month followup visit after completion of radiation therapy.

## 2014-08-22 ENCOUNTER — Ambulatory Visit
Admission: RE | Admit: 2014-08-22 | Discharge: 2014-08-22 | Disposition: A | Discharge status: Home / Self Care | Payer: Commercial Managed Care - PPO | Source: Ambulatory Visit | Attending: Radiation Oncology | Admitting: Radiation Oncology

## 2014-08-22 DIAGNOSIS — Z510 Encounter for antineoplastic radiation therapy: Secondary | ICD-10-CM | POA: Diagnosis not present

## 2014-08-23 ENCOUNTER — Ambulatory Visit
Admission: RE | Admit: 2014-08-23 | Discharge: 2014-08-23 | Disposition: A | Discharge status: Home / Self Care | Payer: Commercial Managed Care - PPO | Source: Ambulatory Visit | Attending: Radiation Oncology | Admitting: Radiation Oncology

## 2014-08-23 DIAGNOSIS — Z510 Encounter for antineoplastic radiation therapy: Secondary | ICD-10-CM | POA: Diagnosis not present

## 2014-08-24 ENCOUNTER — Ambulatory Visit: Admission: RE | Admit: 2014-08-24 | Payer: Commercial Managed Care - PPO | Source: Ambulatory Visit

## 2014-08-24 DIAGNOSIS — Z7982 Long term (current) use of aspirin: Secondary | ICD-10-CM | POA: Insufficient documentation

## 2014-08-24 DIAGNOSIS — D0591 Unspecified type of carcinoma in situ of right breast: Secondary | ICD-10-CM | POA: Insufficient documentation

## 2014-08-24 DIAGNOSIS — Z7983 Long term (current) use of bisphosphonates: Secondary | ICD-10-CM | POA: Insufficient documentation

## 2014-08-24 DIAGNOSIS — Z510 Encounter for antineoplastic radiation therapy: Secondary | ICD-10-CM | POA: Insufficient documentation

## 2014-08-28 ENCOUNTER — Encounter: Payer: Self-pay | Admitting: Radiation Oncology

## 2014-08-28 NOTE — Progress Notes (Signed)
[facility] End of Treatment Note  Name:Linda [name]  Date: 08/28/2014 ENM:[id number] DOB:[birth year]   Status:outpatient    CC: Parke Poisson, MD  Dr. Everardo All, Dr. Rolm Bookbinder  REFERRING PHYSICIAN: Dr. Everardo All   DIAGNOSIS: Stage 0 (Tis N0 M0) low-grade DCIS of the right breast   INDICATION FOR TREATMENT: Curative   TREATMENT DATES: 08/02/2014 through 08/24/2014                          SITE/DOSE:  Right breast 4256 cGy in 16 sessions (hypo-fractionated), no boost                          BEAMS/ENERGY:  6 MV photons, tangential fields                 NARRATIVE:    The patient tolerated treatment well with no significant skin toxicity by completion of therapy.                        PLAN: Routine followup in one month. Patient instructed to call if questions or worsening complaints in interim. When she returns for followup, we will discuss referral to medical oncology for discussion of antiestrogen therapy which, in this case, would be primarily chemopreventive to reduce the risk for development of a new cancer in either breast.

## 2014-08-31 ENCOUNTER — Telehealth: Payer: Self-pay | Admitting: *Deleted

## 2014-08-31 ENCOUNTER — Encounter: Payer: Self-pay | Admitting: Radiation Oncology

## 2014-08-31 ENCOUNTER — Encounter: Payer: Self-pay | Admitting: Hematology and Oncology

## 2014-08-31 NOTE — Telephone Encounter (Signed)
Called patient re: in basket received inquiring if she may have bone density test. Advised her that per Dr Valere Dross she may have bone density test done, to be ordered by her PCP. Left call back name, number for any further questions.

## 2014-08-31 NOTE — Telephone Encounter (Signed)
Printed message for Dr. Geralyn Flash review.

## 2014-09-07 ENCOUNTER — Ambulatory Visit (HOSPITAL_BASED_OUTPATIENT_CLINIC_OR_DEPARTMENT_OTHER): Payer: Commercial Managed Care - PPO | Admitting: Hematology and Oncology

## 2014-09-07 ENCOUNTER — Telehealth: Payer: Self-pay | Admitting: Hematology and Oncology

## 2014-09-07 VITALS — BP 110/64 | HR 73 | Temp 98.2°F | Resp 18 | Ht 68.0 in | Wt 148.5 lb

## 2014-09-07 DIAGNOSIS — C50319 Malignant neoplasm of lower-inner quadrant of unspecified female breast: Secondary | ICD-10-CM

## 2014-09-07 DIAGNOSIS — D0511 Intraductal carcinoma in situ of right breast: Secondary | ICD-10-CM

## 2014-09-07 MED ORDER — ANASTROZOLE 1 MG PO TABS: 1.0000 mg | ORAL_TABLET | Freq: Every day | ORAL | Status: DC

## 2014-09-07 MED ORDER — ESTRADIOL 10 MCG VA TABS: 10.0000 ug | ORAL_TABLET | VAGINAL | Status: DC

## 2014-09-07 NOTE — Telephone Encounter (Signed)
, °

## 2014-09-07 NOTE — Progress Notes (Signed)
Patient Care Team: Parke Poisson, MD as PCP - General (Internal Medicine)  DIAGNOSIS: Malignant neoplasm of lower-inner quadrant of right female breast   Primary site: Breast (Right)   Staging method: AJCC 7th Edition   Clinical: Stage 0 (Tis, N0, cM0) signed by Rexene Edison, MD on 05/19/2014  2:06 PM   Pathologic: Stage 0 (Tis, NX, cM0) signed by Rolm Bookbinder, MD on 06/15/2014 10:18 AM   Summary: Stage 0 (Tis, NX, cM0)   SUMMARY OF ONCOLOGIC HISTORY:   Malignant neoplasm of lower-inner quadrant of right female breast   02/03/2014 Breast MRI 7 X 6X 5 mm right breast abnormality as 2 immediately adjacent 3 mm foci of enhancement   05/19/2014 Initial Diagnosis Malignant neoplasm of lower-inner quadrant of female breast DCIS   06/13/2014 Surgery R Lumpectomy DCIS 2 mm size Grade 1 Er 70%, PR 80%, Margina Neg    CHIEF COMPLIANT: Followup after radiation therapy  INTERVAL HISTORY: Linda [name] is a 68 year old Caucasian with above-mentioned history of DCIS status post lumpectomy and radiation therapy. She is yet to discuss the risks and benefits of starting antiestrogen therapy. She reports that she tolerated radiation fairly well other than some mild skin redness. She does have mild fatigue related to radiation.  REVIEW OF SYSTEMS:   Constitutional: Denies fevers, chills or abnormal weight loss Eyes: Denies blurriness of vision Ears, nose, mouth, throat, and face: Denies mucositis or sore throat Respiratory: Denies cough, dyspnea or wheezes Cardiovascular: Denies palpitation, chest discomfort or lower extremity swelling Gastrointestinal:  Denies nausea, heartburn or change in bowel habits Skin: Denies abnormal skin rashes Lymphatics: Denies new lymphadenopathy or easy bruising Neurological:Denies numbness, tingling or new weaknesses Behavioral/Psych: Mood is stable, no new changes   All other systems were reviewed with the patient and are negative.  I have reviewed the past  medical history, past surgical history, social history and family history with the patient and they are unchanged from previous note.  ALLERGIES:  has No Known Allergies.  MEDICATIONS:  Current Outpatient Prescriptions  Medication Sig Dispense Refill  . ALPRAZolam (XANAX) 0.25 MG tablet Take 0.25 mg by mouth at bedtime as needed.      Marland Kitchen aspirin 81 MG tablet Take 81 mg by mouth daily.      . Cholecalciferol (VITAMIN D PO) Take by mouth.      [name] Calcium (STOOL SOFTENER PO) Take by mouth.      . fexofenadine (ALLEGRA) 60 MG tablet Take 60 mg by mouth daily.      . fluticasone (FLONASE) 50 MCG/ACT nasal spray Place into both nostrils daily.      Marland Kitchen ibandronate (BONIVA) 150 MG tablet Take 1 tablet (150 mg total) by mouth every 30 (thirty) days. In AM with water on an empty stomach upright and nothing by mouth the 30 min  3 tablet  6  . Multiple Vitamin (MULTIVITAMIN) capsule Take 1 capsule by mouth daily.        . Probiotic Product (PROBIOTIC PO) Take by mouth.      . simvastatin (ZOCOR) 20 MG tablet Take 20 mg by mouth every evening.      . Wound Dressings (RADIAGEL) GEL Apply topically.      . Zolpidem Tartrate (AMBIEN PO) Take by mouth.       Marland Kitchen anastrozole (ARIMIDEX) 1 MG tablet Take 1 tablet (1 mg total) by mouth daily.  90 tablet  3   No current facility-administered medications for this visit.    PHYSICAL  EXAMINATION: ECOG PERFORMANCE STATUS: 1 - Symptomatic but completely ambulatory  Filed Vitals:   09/07/14 1545  BP: 110/64  Pulse: 73  Temp: 98.2 F (36.8 C)  Resp: 18   Filed Weights   09/07/14 1545  Weight: 148 lb 8 oz (67.359 kg)    GENERAL:alert, no distress and comfortable SKIN: skin color, texture, turgor are normal, no rashes or significant lesions EYES: normal, Conjunctiva are pink and non-injected, sclera clear OROPHARYNX:no exudate, no erythema and lips, buccal mucosa, and tongue normal  NECK: supple, thyroid normal size, non-tender, without  nodularity LYMPH:  no palpable lymphadenopathy in the cervical, axillary or inguinal LUNGS: clear to auscultation and percussion with normal breathing effort HEART: regular rate & rhythm and no murmurs and no lower extremity edema ABDOMEN:abdomen soft, non-tender and normal bowel sounds Musculoskeletal:no cyanosis of digits and no clubbing  NEURO: alert & oriented x 3 with fluent speech, no focal motor/sensory deficits  LABORATORY DATA:  I have reviewed the data as listed   Chemistry      Component Value Date/Time   NA 141 06/08/2014 1330   K 5.1 06/08/2014 1330   CL 102 06/08/2014 1330   CO2 26 06/08/2014 1330   BUN 23 06/08/2014 1330   CREATININE 0.89 06/08/2014 1330      Component Value Date/Time   CALCIUM 9.2 06/08/2014 1330       Lab Results  Component Value Date   WBC 6.5 06/08/2014   HGB 13.4 06/08/2014   HCT 40.1 06/08/2014   MCV 97.6 06/08/2014   PLT 285 06/08/2014   NEUTROABS 4.1 06/08/2014     RADIOGRAPHIC STUDIES: I have personally reviewed the radiology reports and agreed with their findings. No results found.   ASSESSMENT & PLAN:  Malignant neoplasm of lower-inner quadrant of right female breast DCIS right breast ER/PR positive: Status post lumpectomy and radiation therapy I recommended antiestrogen therapy with aromatase inhibitor. I recommended Arimidex 1 mg daily.  We discussed the risks and benefits of anti-estrogen therapy with aromatase inhibitors. These include but not limited to insomnia, hot flashes, mood changes, vaginal dryness, bone density loss, and weight gain. Although rare, serious side effects including endometrial cancer, risk of blood clots were also discussed. We strongly believe that the benefits far outweigh the risks. Patient understands these risks and consented to starting treatment. Planned treatment duration is 5 years.  I discussed with her the role of antiestrogen therapy is degrees of occurrences of DCIS of breast cancer. She understands  it does not prolong survival.  Bone density: I reviewed her previous bone density which revealed osteopenia. She currently takes calcium and vitamin D. Return to clinic in 3 months for followup on anastrozole.    No orders of the defined types were placed in this encounter.   The patient has a good understanding of the overall plan. she agrees with it. She will call with any problems that may develop before her next visit here.  I spent 20 minutes counseling the patient face to face. The total time spent in the appointment was 25 minutes and more than 50% was on counseling and review of test results    Rulon Eisenmenger, MD 09/07/2014 4:45 PM

## 2014-09-07 NOTE — Assessment & Plan Note (Signed)
DCIS right breast ER/PR positive: Status post lumpectomy and radiation therapy I recommended antiestrogen therapy with aromatase inhibitor. I recommended Arimidex 1 mg daily.  We discussed the risks and benefits of anti-estrogen therapy with aromatase inhibitors. These include but not limited to insomnia, hot flashes, mood changes, vaginal dryness, bone density loss, and weight gain. Although rare, serious side effects including endometrial cancer, risk of blood clots were also discussed. We strongly believe that the benefits far outweigh the risks. Patient understands these risks and consented to starting treatment. Planned treatment duration is 5 years.  I discussed with her the role of antiestrogen therapy is degrees of occurrences of DCIS of breast cancer. She understands it does not prolong survival.  Bone density: I reviewed her previous bone density which revealed osteopenia. She currently takes calcium and vitamin D. Return to clinic in 3 months for followup on anastrozole.

## 2014-09-07 NOTE — Addendum Note (Signed)
Addended by: Prentiss Bells on: 09/07/2014 05:23 PM   Modules accepted: Orders

## 2014-09-08 ENCOUNTER — Ambulatory Visit: Payer: Commercial Managed Care - PPO | Admitting: Hematology and Oncology

## 2014-09-08 ENCOUNTER — Encounter: Payer: Self-pay | Admitting: Hematology and Oncology

## 2014-09-08 ENCOUNTER — Other Ambulatory Visit: Payer: Self-pay

## 2014-09-08 ENCOUNTER — Other Ambulatory Visit: Payer: Self-pay | Admitting: *Deleted

## 2014-09-08 ENCOUNTER — Other Ambulatory Visit: Payer: Self-pay | Admitting: Hematology and Oncology

## 2014-09-08 DIAGNOSIS — C50311 Malignant neoplasm of lower-inner quadrant of right female breast: Secondary | ICD-10-CM

## 2014-09-08 NOTE — Addendum Note (Signed)
Addended by: Prentiss Bells on: 09/08/2014 12:13 PM   Modules accepted: Orders

## 2014-09-08 NOTE — Progress Notes (Signed)
LMOVM - notified pt order for bone density had been placed with the [facility]

## 2014-09-10 ENCOUNTER — Encounter (HOSPITAL_COMMUNITY): Payer: Self-pay | Admitting: Emergency Medicine

## 2014-09-10 ENCOUNTER — Emergency Department (HOSPITAL_COMMUNITY): Payer: Commercial Managed Care - PPO

## 2014-09-10 ENCOUNTER — Emergency Department (HOSPITAL_COMMUNITY)
Admission: EM | Admit: 2014-09-10 | Discharge: 2014-09-11 | Disposition: A | Discharge status: Home / Self Care | Payer: Commercial Managed Care - PPO | Attending: Emergency Medicine | Admitting: Emergency Medicine

## 2014-09-10 ENCOUNTER — Other Ambulatory Visit: Payer: Self-pay

## 2014-09-10 DIAGNOSIS — Z7951 Long term (current) use of inhaled steroids: Secondary | ICD-10-CM | POA: Diagnosis not present

## 2014-09-10 DIAGNOSIS — Z7982 Long term (current) use of aspirin: Secondary | ICD-10-CM | POA: Diagnosis not present

## 2014-09-10 DIAGNOSIS — Z87891 Personal history of nicotine dependence: Secondary | ICD-10-CM | POA: Diagnosis not present

## 2014-09-10 DIAGNOSIS — M1990 Unspecified osteoarthritis, unspecified site: Secondary | ICD-10-CM | POA: Insufficient documentation

## 2014-09-10 DIAGNOSIS — E780 Pure hypercholesterolemia: Secondary | ICD-10-CM | POA: Diagnosis not present

## 2014-09-10 DIAGNOSIS — I493 Ventricular premature depolarization: Secondary | ICD-10-CM | POA: Insufficient documentation

## 2014-09-10 DIAGNOSIS — Z853 Personal history of malignant neoplasm of breast: Secondary | ICD-10-CM | POA: Diagnosis not present

## 2014-09-10 DIAGNOSIS — R002 Palpitations: Secondary | ICD-10-CM | POA: Diagnosis present

## 2014-09-10 DIAGNOSIS — F419 Anxiety disorder, unspecified: Secondary | ICD-10-CM | POA: Insufficient documentation

## 2014-09-10 DIAGNOSIS — Z79899 Other long term (current) drug therapy: Secondary | ICD-10-CM | POA: Diagnosis not present

## 2014-09-10 DIAGNOSIS — Z85828 Personal history of other malignant neoplasm of skin: Secondary | ICD-10-CM | POA: Diagnosis not present

## 2014-09-10 LAB — BASIC METABOLIC PANEL
Anion gap: 14 (ref 5–15)
BUN: 17 mg/dL (ref 6–23)
CO2: 23 mEq/L (ref 19–32)
Calcium: 9 mg/dL (ref 8.4–10.5)
Chloride: 99 mEq/L (ref 96–112)
Creatinine, Ser: 0.92 mg/dL (ref 0.50–1.10)
GFR calc Af Amer: 72 mL/min — ABNORMAL LOW (ref >90)
GFR calc non Af Amer: 63 mL/min — ABNORMAL LOW (ref >90)
Glucose, Bld: 121 mg/dL — ABNORMAL HIGH (ref 70–99)
Potassium: 4 mEq/L (ref 3.7–5.3)
Sodium: 136 mEq/L — ABNORMAL LOW (ref 137–147)

## 2014-09-10 LAB — I-STAT TROPONIN, ED: Troponin i, poc: 0.01 ng/mL (ref 0.00–0.08)

## 2014-09-10 LAB — CBC
HCT: 40.1 % (ref 36.0–46.0)
Hemoglobin: 13.8 g/dL (ref 12.0–15.0)
MCH: 34.1 pg — ABNORMAL HIGH (ref 26.0–34.0)
MCHC: 34.4 g/dL (ref 30.0–36.0)
MCV: 99 fL (ref 78.0–100.0)
Platelets: 266 10*3/uL (ref 150–400)
RBC: 4.05 MIL/uL (ref 3.87–5.11)
RDW: 13 % (ref 11.5–15.5)
WBC: 5.8 10*3/uL (ref 4.0–10.5)

## 2014-09-10 LAB — PRO B NATRIURETIC PEPTIDE: Pro B Natriuretic peptide (BNP): 252 pg/mL — ABNORMAL HIGH (ref 0–125)

## 2014-09-10 NOTE — ED Notes (Signed)
Pt presents to ED with new onset of heart palpitations starting today at noon, associated with some SOB. Pt had received her flu shot yesterday, had a Left lumpectomy 2 months ago and just finished radiation 2 weeks ago. Pt alert and oriented, EKG completed in triage, pt denies any chest pain, nausea, vomiting, chills cough, or fever. Pt does endorse chest congestion and taking mucinex but denies any cough

## 2014-09-11 ENCOUNTER — Telehealth: Payer: Self-pay | Admitting: *Deleted

## 2014-09-11 ENCOUNTER — Other Ambulatory Visit: Payer: Self-pay | Admitting: *Deleted

## 2014-09-11 MED ORDER — LORAZEPAM 0.5 MG PO TABS: 0.5000 mg | ORAL_TABLET | Freq: Three times a day (TID) | ORAL | Status: DC | PRN

## 2014-09-11 NOTE — ED Notes (Signed)
Pt from home for eval of heart palpitations that started this afternoon while cooking, pt states the palpitations have been constant throughout the day with worsening periods. Pt denies any cp or sob, also denies any dizziness, n/v/d. nad noted. Pt axo x4.

## 2014-09-11 NOTE — ED Provider Notes (Signed)
68 year old female comes in because of feeling palpitations today. She denies chest pain, heaviness, tightness, pressure. On exam, lungs are clear. Heart has an irregular rhythm but no murmur. Monitor shows frequent PACs and ECG shows frequent PACs. Patient is reassured of benign nature of these conditions, and she is discharged with a prescription for lorazepam. She is referred to cardiology for followup.  Medical screening examination/treatment/procedure(s) were conducted as a shared visit with non-physician practitioner(s) and myself.  I personally evaluated the patient during the encounter.   Date: 09/11/2014  Rate: 98  Rhythm: normal sinus rhythm and premature atrial contractions (PAC)  QRS Axis: normal  Intervals: normal  ST/T Wave abnormalities: normal  Conduction Disutrbances:none  Narrative Interpretation: PAc's. Otherwise normal ECG. No old ECG available for comparison.  Old EKG Reviewed: none available    Delora Fuel, MD 62/03/55 [id number]

## 2014-09-11 NOTE — Telephone Encounter (Signed)
Spoke with Ena Dawley @ [facility] about bone density scan.  Ena Dawley stated she would contact pt with further instructions.

## 2014-09-11 NOTE — Discharge Instructions (Signed)
Premature Beats A premature beat is an extra heartbeat that happens earlier than normal. Premature beats are called premature atrial contractions (PACs) or premature ventricular contractions (PVCs) depending on the area of the heart where they start. CAUSES  Premature beats may be brought on by a variety of factors including:  Emotional stress.  Lack of sleep.  Caffeine.  Asthma medicines.  Stimulants.  Herbal teas.  Dietary supplements.  Alcohol. In most cases, premature beats are not dangerous and are not a sign of serious heart disease. Most patients evaluated for premature beats have completely normal heart function. Rarely, premature beats may be a sign of more significant heart problems or medical illness. SYMPTOMS  Premature beats may cause palpitations. This means you feel like your heart is skipping a beat or beating harder than usual. Sometimes, slight chest pain occurs with premature beats, lasting only a few seconds. This pain has been described as a "flopping" feeling inside the chest. In many cases, premature beats do not cause any symptoms and they are only detected when an electrocardiography test (EKG) or heart monitoring is performed. DIAGNOSIS  Your caregiver may run some tests to evaluate your heart such as an EKG or echocardiography. You may need to wear a portable heart monitor for several days to record the electrical activity of your heart. Blood testing may also be performed to check your electrolytes and thyroid function. TREATMENT  Premature beats usually go away with rest. If the problem continues, your caregiver will determine a treatment plan for you.  HOME CARE INSTRUCTIONS  Get plenty of rest over the next few days until your symptoms improve.  Avoid coffee, tea, alcohol, and soda (pop, cola).  Do not smoke. SEEK MEDICAL CARE IF:  Your symptoms continue after 1 to 2 days of rest.  You have new symptoms, such as chest pain or trouble  breathing. SEEK IMMEDIATE MEDICAL CARE IF:  You have severe chest pain or abdominal pain.  You have pain that radiates into the neck, arm, or jaw.  You faint or have extreme weakness.  You have shortness of breath.  Your heartbeat races for more than 5 seconds. MAKE SURE YOU:  Understand these instructions.  Will watch your condition.  Will get help right away if you are not doing well or get worse. Document Released: 12/18/2004 Document Revised: 02/02/2012 Document Reviewed: 07/14/2011 [facility] Patient Information 2015 [place], [place]. This information is not intended to replace advice given to you by your health care provider. Make sure you discuss any questions you have with your health care provider.  Holter Monitoring A Holter monitor is a small device with electrodes (small sticky patches) that attach to your chest. It records the electrical activity of your heart and is worn continuously for 24-48 hours.  A HOLTER MONITOR IS USED TO  Detect heart problems such as:  Heart arrhythmia. Is an abnormal or irregular heartbeat. With some heart arrhythmias, you may not feel or know that you have an irregular heart rhythm.  Palpitations, such as feeling your heart racing or fluttering. It is possible to have heart palpitations and not have a heart arrhythmia.  A heart rhythm that is too slow or too fast.  If you have problems fainting, near fainting or feeling light-headed, a Holter monitor may be worn to see if your heart is the cause. HOLTER MONITOR PREPARATION   Electrodes will be attached to the skin on your chest.  If you have hair on your chest, small areas may have  to be shaved. This is done to help the patches stick better and make the recording more accurate.  The electrodes are attached by wires to the Holter monitor. The Holter monitor clips to your clothing. You will wear the monitor at all times, even while exercising and sleeping. HOME CARE INSTRUCTIONS   Wear  your monitor at all times.  The wires and the monitor must stay dry. Do not get the monitor wet.  Do not bathe, swim or use a hot tub with it on.  You may do a "sponge" bath while you have the monitor on.  Keep your skin clean, do not put body lotion or moisturizer on your chest.  It's possible that your skin under the electrodes could become irritated. To keep this from happening, you may put the electrodes in slightly different places on your chest.  Your caregiver will also ask you to keep a diary of your activities, such as walking or doing chores. Be sure to note what you are doing if you experience heart symptoms such as palpitations. This will help your caregiver determine what might be contributing to your symptoms. The information stored in your monitor will be reviewed by your caregiver alongside your diary entries.  Make sure the monitor is safely clipped to your clothing or in a location close to your body that your caregiver recommends.  The monitor and electrodes are removed when the test is over. Return the monitor as directed.  Be sure to follow up with your caregiver and discuss your Holter monitor results. SEEK IMMEDIATE MEDICAL CARE IF:  You faint or feel lightheaded.  You have trouble breathing.  You get pain in your chest, upper arm or jaw.  You feel sick to your stomach and your skin is pale, cool, or damp.  You think something is wrong with the way your heart is beating. MAKE SURE YOU:   Understand these instructions.  Will watch your condition.  Will get help right away if you are not doing well or get worse. Document Released: 08/08/2004 Document Revised: 02/02/2012 Document Reviewed: 12/21/2008 [facility] Patient Information 2015 [employer], [place]. This information is not intended to replace advice given to you by your health care provider. Make sure you discuss any questions you have with your health care provider.

## 2014-09-11 NOTE — ED Provider Notes (Signed)
CSN: [id number]     Arrival date & time 09/10/14  2135 History   First MD Initiated Contact with Patient 09/11/14 0019     Chief Complaint  Patient presents with  . Palpitations     (Consider location/radiation/quality/duration/timing/severity/associated sxs/prior Treatment) Patient is a 68 y.o. female presenting with palpitations. The history is provided by the patient. No language interpreter was used.  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 No chest pain, SOB, vomiting or diaphoresis. She denies history of similar symptoms, and has never needed to see a cardiologist for any reason.    Past Medical History  Diagnosis Date  . Hypercholesteremia   . Atrophic vaginitis   . Cervical dysplasia 1975  . Osteopenia 11/2011    T score -1.7 FRAX not calculated due to history of bisphosphonates and current Evista  . Breast cancer     right  . Anxiety   . Insomnia   . Arthritis   . Skin cancer 2006    basal cell of face  . Seasonal allergies    Past Surgical History  Procedure Laterality Date  . Appendectomy  1955  . Excision of basal cell ca from face  2006  . Cataract extraction      Both eyes  . Knee surgery  2013    Arthroscopic-rt  . Gynecologic cryosurgery    . Colposcopy    . Cataract surgery  2005  . Facelift  2014  . Facial cosmetic surgery    . Breast biopsy  2010    rt bx  . Breast lumpectomy  1983    BENIGN-rt  . Breast biopsy  2009    rt  . Colonoscopy    . Breast lumpectomy with radioactive seed localization Right 06/13/2014    Procedure: RIGHT BREAST  RADIOACTIVE SEED GUIDED LUMPECTOMY;  Surgeon: Rolm Bookbinder, MD;  Location: [facility];  Service:  General;  Laterality: Right;   Family History  Problem Relation Age of Onset  . Breast cancer Mother     DIAGNOSED IN HER 80'S  . Hyperlipidemia Mother   . Osteoporosis Mother   . Breast cancer Sister     DIAGNOSED AT AGE 80  . Hypertension Maternal Grandmother   . Heart disease Maternal Grandfather   . Stroke Maternal Grandfather   . Diabetes Son     TYPE 1 DIABETES  . Colon cancer Paternal Grandfather   . Cancer Paternal Grandfather     colon  . Heart failure Son    History  Substance Use Topics  . Smoking status: Former Smoker    Quit date: 05/19/1974  . Smokeless tobacco: Not on file     Comment: smoked in college  . Alcohol Use: 2.5 oz/week    5 drink(s) per week   OB History   Grav Para Term Preterm Abortions TAB SAB Ect Mult Living   4 4 4       4      Review of Systems  Constitutional: Negative for fever and chills.  HENT: Negative.   Respiratory: Negative.  Negative for shortness of breath.   Cardiovascular: Positive for palpitations. Negative for chest pain.  Gastrointestinal: Negative.  Negative for nausea, vomiting and abdominal pain.  Musculoskeletal: Negative.   Skin: Negative.   Neurological: Negative.       Allergies  Review of patient's allergies indicates no known allergies.  Home Medications   Prior to Admission medications   Medication Sig Start Date End Date Taking? Authorizing Provider  ALPRAZolam (XANAX) 0.25 MG tablet Take 0.25 mg by mouth at bedtime as needed.    Historical Provider, MD  anastrozole (ARIMIDEX) 1 MG tablet Take 1 tablet (1 mg total) by mouth daily. 09/07/14   Rulon Eisenmenger, MD  aspirin 81 MG tablet Take 81 mg by mouth daily. 07/10/14   Historical Provider, MD  Cholecalciferol (VITAMIN D PO) Take by mouth.    Historical Provider, MD  Docusate Calcium (STOOL SOFTENER PO) Take by mouth.    Historical Provider, MD  Estradiol (VAGIFEM) 10 MCG TABS vaginal tablet Place 1 tablet (10 mcg total) vaginally once a week. 1 tablet  vaginally twice a week for 2 week.  Then 1 tablet vaginally. 09/07/14   Rulon Eisenmenger, MD  fexofenadine (ALLEGRA) 60 MG tablet Take 60 mg by mouth daily. 07/10/14   Historical Provider, MD  fluticasone (FLONASE) 50 MCG/ACT nasal spray Place into both nostrils daily.    Historical Provider, MD  ibandronate (BONIVA) 150 MG tablet Take 1 tablet (150 mg total) by mouth every 30 (thirty) days. In AM with water on an empty stomach upright and nothing by mouth the 30 min 07/11/14   Rulon Eisenmenger, MD  Multiple Vitamin (MULTIVITAMIN) capsule Take 1 capsule by mouth daily.      Historical Provider, MD  Probiotic Product (PROBIOTIC PO) Take by mouth.    Historical Provider, MD  simvastatin (ZOCOR) 20 MG tablet Take 20 mg by mouth every evening.    Historical Provider, MD  Wound Dressings (RADIAGEL) GEL Apply topically.    Historical Provider, MD  Zolpidem Tartrate (AMBIEN PO) Take by mouth.     Historical Provider, MD   BP 120/70  Pulse 62  Temp(Src) 98 F (36.7 C) (Oral)  Resp 17  Ht 5\' 6"  (1.676 m)  Wt 148 lb (67.132 kg)  BMI 23.90 kg/m2  SpO2 97% Physical Exam  Constitutional: She is oriented to person, place, and time. She appears well-developed and well-nourished.  HENT:  Head: Normocephalic.  Neck: Normal range of motion. Neck supple.  Cardiovascular: Normal rate.  An irregular rhythm present.  Pulmonary/Chest: Effort normal and breath sounds normal.  Abdominal: Soft. Bowel sounds are normal. There is no tenderness. There is no rebound and no guarding.  Musculoskeletal: Normal range of motion.  Neurological: She is alert and oriented to person, place, and time.  Skin: Skin is warm and dry. No rash noted.  Psychiatric: She has a normal mood and affect.    ED Course  Procedures (including critical care time) Labs Review Labs Reviewed  CBC - Abnormal; Notable for the following:    MCH 34.1 (*)    All other components within normal limits  BASIC METABOLIC PANEL - Abnormal; Notable for  the following:    Sodium 136 (*)    Glucose, Bld 121 (*)    GFR calc non Af Amer 63 (*)    GFR calc Af Amer 72 (*)    All other components within normal limits  PRO B NATRIURETIC PEPTIDE - Abnormal; Notable for the following:    Pro B Natriuretic peptide (BNP) 252.0 (*)    All other components within normal limits  I-STAT TROPOININ, ED   Results for orders placed  during the hospital encounter of 09/10/14  CBC      Result Value Ref Range   WBC 5.8  4.0 - 10.5 K/uL   RBC 4.05  3.87 - 5.11 MIL/uL   Hemoglobin 13.8  12.0 - 15.0 g/dL   HCT 40.1  36.0 - 46.0 %   MCV 99.0  78.0 - 100.0 fL   MCH 34.1 (*) 26.0 - 34.0 pg   MCHC 34.4  30.0 - 36.0 g/dL   RDW 13.0  11.5 - 15.5 %   Platelets 266  150 - 400 K/uL  BASIC METABOLIC PANEL      Result Value Ref Range   Sodium 136 (*) 137 - 147 mEq/L   Potassium 4.0  3.7 - 5.3 mEq/L   Chloride 99  96 - 112 mEq/L   CO2 23  19 - 32 mEq/L   Glucose, Bld 121 (*) 70 - 99 mg/dL   BUN 17  6 - 23 mg/dL   Creatinine, Ser 0.92  0.50 - 1.10 mg/dL   Calcium 9.0  8.4 - 10.5 mg/dL   GFR calc non Af Amer 63 (*) >90 mL/min   GFR calc Af Amer 72 (*) >90 mL/min   Anion gap 14  5 - 15  PRO B NATRIURETIC PEPTIDE      Result Value Ref Range   Pro B Natriuretic peptide (BNP) 252.0 (*) 0 - 125 pg/mL  I-STAT TROPOININ, ED      Result Value Ref Range   Troponin i, poc 0.01  0.00 - 0.08 ng/mL   Comment 3              Imaging Review Dg Chest 2 View  09/10/2014   CLINICAL DATA:  Irregular heartbeat tonight. Patient just completed radiation to the right side for breast cancer. Palpitations.  EXAM: CHEST  2 VIEW  COMPARISON:  None.  FINDINGS: Postoperative changes in the right breast. Mild pulmonary hyperinflation. Normal heart size and pulmonary vascularity. No focal airspace disease or consolidation in the lungs. No blunting of costophrenic angles. No pneumothorax. Mediastinal contours appear intact.  IMPRESSION: No active cardiopulmonary disease.   Electronically  Signed   By: Lucienne Capers M.D.   On: 09/10/2014 23:06     EKG Interpretation None      MDM   Final diagnoses:  None    1. Arrythmia  Multiple PVCs without ischemia, normal rate. The patient is evaluated by Dr. Roxanne Mins and is felt stable for discharge home. Recommend cardiology referral to consider holter monitoring and further outpatient evaluation.     Dewaine Oats, PA-C 09/12/14 [id number]

## 2014-09-11 NOTE — ED Notes (Signed)
Pt A&OX4, ambulatory at d/c with steady gait, NAD 

## 2014-09-13 NOTE — ED Provider Notes (Signed)
Medical screening examination/treatment/procedure(s) were performed by non-physician practitioner and as supervising physician I was immediately available for consultation/collaboration.   EKG Interpretation   Date/Time:  Sunday September 10 2014 21:41:06 EDT Ventricular Rate:  98 PR Interval:  168 QRS Duration: 90 QT Interval:  348 QTC Calculation: 444 R Axis:   38 Text Interpretation:  Sinus rhythm with Premature supraventricular  complexes Otherwise normal ECG ED PHYSICIAN INTERPRETATION AVAILABLE IN  [facility] Confirmed by TEST, Record ([id number]) on 09/12/2014 6:53:12 AM       Richarda Blade, MD 09/13/14 [phone]

## 2014-09-21 ENCOUNTER — Ambulatory Visit
Admission: RE | Admit: 2014-09-21 | Discharge: 2014-09-21 | Disposition: A | Discharge status: Home / Self Care | Payer: Commercial Managed Care - PPO | Source: Ambulatory Visit | Attending: Hematology and Oncology | Admitting: Hematology and Oncology

## 2014-09-21 ENCOUNTER — Encounter: Payer: Self-pay | Admitting: *Deleted

## 2014-09-21 DIAGNOSIS — C50311 Malignant neoplasm of lower-inner quadrant of right female breast: Secondary | ICD-10-CM

## 2014-09-25 ENCOUNTER — Encounter: Payer: Self-pay | Admitting: *Deleted

## 2014-09-26 ENCOUNTER — Ambulatory Visit
Admission: RE | Admit: 2014-09-26 | Discharge: 2014-09-26 | Disposition: A | Discharge status: Home / Self Care | Payer: Commercial Managed Care - PPO | Source: Ambulatory Visit | Attending: Radiation Oncology | Admitting: Radiation Oncology

## 2014-09-26 ENCOUNTER — Encounter: Payer: Self-pay | Admitting: Radiation Oncology

## 2014-09-26 VITALS — BP 112/64 | HR 79 | Temp 98.6°F | Resp 20 | Wt 149.7 lb

## 2014-09-26 DIAGNOSIS — C50311 Malignant neoplasm of lower-inner quadrant of right female breast: Secondary | ICD-10-CM

## 2014-09-26 NOTE — Progress Notes (Signed)
Follow-up note:  The patient returns today approximately 1 month follow-up completion of radiation following conservative surgery in the management of her low-grade DCIS of the right breast. She is without complaints today. She is on adjuvant Arimidex through Dr. Lindi Adie.  Physical examination: Alert and oriented. Filed Vitals:   09/26/14 1022  BP: 112/64  Pulse: 79  Temp: 98.6 F (37 C)  Resp: 20   Nodes: There is no palpable cervical, supraclavicular, or axillary lymphadenopathy. Chest: Lungs clear. Breasts: There is residual erythema along the right breast with mild thickening. No masses are appreciated. Left breast without masses or lesions. Extremities: Without edema.  Impression: Satisfactory progress.  Plan: She will see Dr. Lindi Adie for a follow-up visit on 12/11/2014. She can wait until next year for her baseline right breast mammogram. I have not scheduled the patient for a formal follow-up visit to see me.

## 2014-09-26 NOTE — Progress Notes (Signed)
Patient denies pain, fatigue, loss of appetite. She states her skin of right breast treatment area has healed. She is taking Arimidex daily.

## 2014-10-10 ENCOUNTER — Telehealth: Payer: Self-pay

## 2014-10-10 NOTE — Telephone Encounter (Signed)
Results of bone density dtd 09/21/14 rcvd from [facility].   Reviewed by Dr. Lindi Adie.  Sent to scan.

## 2014-10-30 ENCOUNTER — Encounter: Payer: Self-pay | Admitting: Internal Medicine

## 2014-10-30 ENCOUNTER — Ambulatory Visit (INDEPENDENT_AMBULATORY_CARE_PROVIDER_SITE_OTHER): Payer: Commercial Managed Care - PPO | Admitting: Internal Medicine

## 2014-10-30 VITALS — BP 110/70 | HR 72 | Ht 66.0 in | Wt 151.1 lb

## 2014-10-30 DIAGNOSIS — E785 Hyperlipidemia, unspecified: Secondary | ICD-10-CM

## 2014-10-30 DIAGNOSIS — R002 Palpitations: Secondary | ICD-10-CM | POA: Insufficient documentation

## 2014-10-30 DIAGNOSIS — I493 Ventricular premature depolarization: Secondary | ICD-10-CM

## 2014-10-30 NOTE — Progress Notes (Signed)
Primary Care Physician: Lang Snow MD Referring Physician: Dr Roxanne Mins (ER)   Linda [name] is a 68 y.o. female with a h/o hyperlipidemia and R breast CA s/p radiation and lumpectomy who presents for cardiology evaluation of palpitations/ PVCs.  She reports that on 09/10/14 she developed palpitations.  She reports that she had been drinking coffee that her husband made in their coffee maker and thinks that she received an additional dose of caffeine.  She had "skipped beats" last about 6 hours.  These gradually resolved.  She went to [facility] where she was found to have PVCs on telemetry.  She was encouraged to establish with cardiology and discharged.  She has had no further palpitations since that time.  Today, she denies symptoms of  chest pain, shortness of breath, orthopnea, PND, lower extremity edema, dizziness, presyncope, syncope, or neurologic sequela. She is very active for her age.  The patient is tolerating medications without difficulties and is otherwise without complaint today.   Past Medical History  Diagnosis Date  . Hypercholesteremia   . Atrophic vaginitis   . Cervical dysplasia 1975  . Osteopenia 11/2011    T score -1.7 FRAX not calculated due to history of bisphosphonates and current Evista  . Breast cancer     right (DCIS) s/p lumpectomy 7/15  . Anxiety   . Insomnia   . Arthritis   . Skin cancer 2006    basal cell of face  . Seasonal allergies   . Hx of radiation therapy 08/02/14- 08/24/14    right reast 4256 cGy i 16 sessions, no boost  . DCIS (ductal carcinoma in situ)     RIGHT  . Cataract     BILATERAL   Past Surgical History  Procedure Laterality Date  . Appendectomy  1955  . Excision of basal cell ca from face  2006  . Cataract extraction      Both eyes  . Knee surgery  2013    Arthroscopic-rt  . Gynecologic cryosurgery    . Colposcopy    . Cataract surgery  2005  . Facelift  2014  . Facial cosmetic surgery    . Breast biopsy  2010    rt bx  .  Breast lumpectomy  1983    BENIGN-rt  . Breast biopsy  2009    rt  . Colonoscopy    . Breast lumpectomy with radioactive seed localization Right 06/13/2014    Procedure: RIGHT BREAST  RADIOACTIVE SEED GUIDED LUMPECTOMY;  Surgeon: Rolm Bookbinder, MD;  Location: [place];  Service: General;  Laterality: Right;for DCIS    Current Outpatient Prescriptions  Medication Sig Dispense Refill  . ALPRAZolam (XANAX) 0.25 MG tablet Take 0.25 mg by mouth at bedtime as needed for sleep.     Marland Kitchen aspirin 81 MG tablet Take 81 mg by mouth daily.    Marland Kitchen CALCIUM-VITAMIN D PO Chew 1 tablet by mouth twice a day    . Cholecalciferol (VITAMIN D PO) Take 1 capsule by mouth daily.     . fexofenadine (ALLEGRA) 60 MG tablet Take 60 mg by mouth daily.    . fluocinonide (LIDEX) 0.05 % external solution Use 1 application on the scalp 1-2 times a week  1  . fluticasone (FLONASE) 50 MCG/ACT nasal spray Place 2 sprays into both nostrils daily.     Marland Kitchen FLUZONE HIGH-DOSE 0.5 ML SUSY   0  . ibandronate (BONIVA) 150 MG tablet Take 1 tablet (150 mg total) by mouth every 30 (  thirty) days. In AM with water on an empty stomach upright and nothing by mouth the 30 min 3 tablet 6  . LORazepam (ATIVAN) 0.5 MG tablet Take 1 tablet (0.5 mg total) by mouth every 8 (eight) hours as needed for anxiety. 15 tablet 0  . Multiple Vitamin (MULTIVITAMIN) capsule Take 1 capsule by mouth daily.      . Probiotic Product (PROBIOTIC PO) Take 1 capsule by mouth daily.     . simvastatin (ZOCOR) 20 MG tablet Take 20 mg by mouth every evening.    . Wound Dressings (RADIAGEL) GEL Apply 1 application topically daily. affected area    . zolpidem (AMBIEN) 5 MG tablet TAKE ONE-HALF TABLET BY MOUTH EVERY NIGHT AT BEDTIME AS NEEDED FOR SLEEP     No current facility-administered medications for this visit.    No Known Allergies  History   Social History  . Marital Status: Married    Spouse Name: N/A    Number of Children: N/A  . Years of  Education: N/A   Occupational History  . Not on file.   Social History Main Topics  . Smoking status: Former Smoker    Quit date: 05/19/1974  . Smokeless tobacco: Not on file     Comment: smoked in college  . Alcohol Use: 3.0 oz/week    5 Not specified per week     Comment: 5-6 glasses of liquor each week  . Drug Use: No  . Sexual Activity: Yes    Birth Control/ Protection: Post-menopausal     Comment: G4 P4    Other Topics Concern  . Not on file   Social History Narrative   Pt lives in [place] with spouse.  4 children and 8 grandchildren.   House wife    Family History  Problem Relation Age of Onset  . Breast cancer Mother     DIAGNOSED IN HER 80'S  . Hyperlipidemia Mother   . Osteoporosis Mother   . Breast cancer Sister     DIAGNOSED AT AGE 69  . Hypertension Maternal Grandmother   . Heart disease Maternal Grandfather   . Stroke Maternal Grandfather   . Diabetes Son     TYPE 1 DIABETES  . Colon cancer Paternal Grandfather   . Cancer Paternal Grandfather     colon  . Heart failure Son     ROS- All systems are reviewed and negative except as per the HPI above  Physical Exam: Filed Vitals:   10/30/14 0830  BP: 110/70  Pulse: 72  Height: 5\' 6"  (1.676 m)  Weight: 151 lb 1.9 oz (68.548 kg)    GEN- The patient is well appearing, alert and oriented x 3 today.   Head- normocephalic, atraumatic Eyes-  Sclera clear, conjunctiva pink Ears- hearing intact Oropharynx- clear Neck- supple, no JVP Lymph- no cervical lymphadenopathy Lungs- Clear to ausculation bilaterally, normal work of breathing Heart- Regular rate and rhythm, no murmurs, rubs or gallops, PMI not laterally displaced GI- soft, NT, ND, + BS Extremities- no clubbing, cyanosis, or edema MS- no significant deformity or atrophy Skin- no rash or lesion Psych- euthymic mood, full affect Neuro- strength and sensation are intact  EKG from 10/15 is reviewed and revealed only sinus rhythm Epic  records including ER visit are reviewed ekg today reveals sinus rhythm with complete rbbb, otherwise normal ekg  Assessment and Plan:  1. PVCs/ palpitations She presents for further evaluation of recent palpitations/ PVCs She has no FH of sudden death or arrhythmias.  Her EKG is low risk.  She denies presyncope or syncope. She attributes her palpitations to excessive caffeine and this may been the case as they have not recurred.  Lifestyle modification including avoidance of excessive caffeine, ETOH, and stress have been discussed.  Regular exercise and adequate sleep are also encouraged. I will obtain an echo to evaluate for structural abnormalities which could lead to additional ventricular arrhythmias and also obtain an ETT to evaluate for exercise induced arrhythmias/ ischemic  If these are low risk then no further evaluation is planned and I will see as needed going forward.  She does not wish to take beta blockers at this time.  2. HL Stable No change required today

## 2014-10-30 NOTE — Patient Instructions (Signed)
Your physician recommends that you schedule a follow-up appointment as needed  Your physician has requested that you have an echocardiogram. Echocardiography is a painless test that uses sound waves to create images of your heart. It provides your doctor with information about the size and shape of your heart and how well your heart's chambers and valves are working. This procedure takes approximately one hour. There are no restrictions for this procedure.   Your physician has requested that you have an exercise tolerance test. For further information please visit [email or web address]. Please also follow instruction sheet, as given.

## 2014-11-14 ENCOUNTER — Encounter: Payer: Self-pay | Admitting: Hematology and Oncology

## 2014-12-04 ENCOUNTER — Ambulatory Visit (HOSPITAL_COMMUNITY): Payer: Commercial Managed Care - PPO | Attending: Internal Medicine | Admitting: Radiology

## 2014-12-04 ENCOUNTER — Encounter: Payer: Self-pay | Admitting: Radiation Oncology

## 2014-12-04 DIAGNOSIS — Z923 Personal history of irradiation: Secondary | ICD-10-CM | POA: Diagnosis not present

## 2014-12-04 DIAGNOSIS — R002 Palpitations: Secondary | ICD-10-CM | POA: Diagnosis present

## 2014-12-04 DIAGNOSIS — I351 Nonrheumatic aortic (valve) insufficiency: Secondary | ICD-10-CM | POA: Diagnosis not present

## 2014-12-04 DIAGNOSIS — I493 Ventricular premature depolarization: Secondary | ICD-10-CM | POA: Diagnosis present

## 2014-12-04 DIAGNOSIS — E785 Hyperlipidemia, unspecified: Secondary | ICD-10-CM | POA: Insufficient documentation

## 2014-12-04 DIAGNOSIS — Z853 Personal history of malignant neoplasm of breast: Secondary | ICD-10-CM | POA: Diagnosis not present

## 2014-12-04 NOTE — Progress Notes (Signed)
Echocardiogram performed.  

## 2014-12-11 ENCOUNTER — Telehealth: Payer: Self-pay | Admitting: Hematology and Oncology

## 2014-12-11 ENCOUNTER — Ambulatory Visit (HOSPITAL_BASED_OUTPATIENT_CLINIC_OR_DEPARTMENT_OTHER): Payer: Commercial Managed Care - PPO | Admitting: Hematology and Oncology

## 2014-12-11 VITALS — BP 136/72 | HR 73 | Temp 97.6°F | Resp 20 | Ht 66.0 in | Wt 147.3 lb

## 2014-12-11 DIAGNOSIS — M8580 Other specified disorders of bone density and structure, unspecified site: Secondary | ICD-10-CM

## 2014-12-11 DIAGNOSIS — C50311 Malignant neoplasm of lower-inner quadrant of right female breast: Secondary | ICD-10-CM

## 2014-12-11 DIAGNOSIS — D0511 Intraductal carcinoma in situ of right breast: Secondary | ICD-10-CM

## 2014-12-11 NOTE — Telephone Encounter (Signed)
, °

## 2014-12-12 ENCOUNTER — Ambulatory Visit (INDEPENDENT_AMBULATORY_CARE_PROVIDER_SITE_OTHER): Payer: Commercial Managed Care - PPO | Admitting: Nurse Practitioner

## 2014-12-12 ENCOUNTER — Encounter: Payer: Self-pay | Admitting: Nurse Practitioner

## 2014-12-12 DIAGNOSIS — I493 Ventricular premature depolarization: Secondary | ICD-10-CM

## 2014-12-12 NOTE — Assessment & Plan Note (Signed)
Right breast DCIS ER/PR positive diagnosed March 2015 7 mm focus on MRI and alert lumpectomy 06/13/2014 2 mm focus of DCIS negative margins, ER 70%, PR 80% positive status post radiation therapy  Antiestrogen therapy: Patient is not very keen on antiestrogen therapy because she does not believe that benefits outweigh risks. We discussed the risk of another DCIS or invasive breast cancer is about 1% per year for DCIS. However it is difficult to determine her true risk because of how small the DCIS is. I provided her with the Lancet article regarding anastrozole versus tamoxifen. It appears that she is leaning towards surveillance and observation rather than interventions like antiestrogen therapy.  If she decides to go on antiestrogen therapy, I would recommend tamoxifen because of her osteopenia issues.  Genetic counseling: Patient has multiple family members with breast cancer and hence I recommended genetic counseling. We will make her an appointment to see our genetics counselor. This may determine if more aggressive screening with breast MRI may be indicated.  Osteopenia: Currently on calcium and vitamin D along with Boniva started August 2015. Her plans to treat her for 2 years and then give her a two-year break.  Return to clinic in 2 months to make a final decision.

## 2014-12-12 NOTE — Progress Notes (Signed)
Patient Care Team: Parke Poisson, MD as PCP - General (Internal Medicine)  DIAGNOSIS: Malignant neoplasm of lower-inner quadrant of right female breast   Staging form: Breast, AJCC 7th Edition     Clinical: Stage 0 (Tis, N0, cM0) - Signed by Rexene Edison, MD on 05/19/2014     Pathologic: Stage 0 (Tis, NX, cM0) - Signed by Rolm Bookbinder, MD on 06/15/2014   SUMMARY OF ONCOLOGIC HISTORY:   Malignant neoplasm of lower-inner quadrant of right female breast   02/03/2014 Breast MRI 7 X 6X 5 mm right breast abnormality as 2 immediately adjacent 3 mm foci of enhancement   05/19/2014 Initial Diagnosis Malignant neoplasm of lower-inner quadrant of female breast DCIS   06/13/2014 Surgery R Lumpectomy DCIS 2 mm size Grade 1 Er 70%, PR 80%, Margina Neg    CHIEF COMPLIANT: Follow-up after radiation  INTERVAL HISTORY: Linda [name] is a 69 year old lady with above-mentioned history of DCIS involving the right breast treated with lumpectomy and radiation. We have discussed the risks and benefits of oral antiestrogen therapy previously. She is here today for finalize a treatment plan. She's been doing very well without any major problems or concerns. She done radiation fairly well. Denies any new lumps or nodules in the breasts.  REVIEW OF SYSTEMS:   Constitutional: Denies fevers, chills or abnormal weight loss Eyes: Denies blurriness of vision Ears, nose, mouth, throat, and face: Denies mucositis or sore throat Respiratory: Denies cough, dyspnea or wheezes Cardiovascular: Denies palpitation, chest discomfort or lower extremity swelling Gastrointestinal:  Denies nausea, heartburn or change in bowel habits Skin: Denies abnormal skin rashes Lymphatics: Denies new lymphadenopathy or easy bruising Neurological:Denies numbness, tingling or new weaknesses Behavioral/Psych: Mood is stable, no new changes  Breast:  denies any pain or lumps or nodules in either breasts All other systems were reviewed  with the patient and are negative.  I have reviewed the past medical history, past surgical history, social history and family history with the patient and they are unchanged from previous note.  ALLERGIES:  has No Known Allergies.  MEDICATIONS:  Current Outpatient Prescriptions  Medication Sig Dispense Refill  . ALPRAZolam (XANAX) 0.25 MG tablet Take 0.25 mg by mouth at bedtime as needed for sleep.     Marland Kitchen aspirin 81 MG tablet Take 81 mg by mouth daily.    Marland Kitchen CALCIUM-VITAMIN D PO Chew 1 tablet by mouth twice a day    . Cholecalciferol (VITAMIN D PO) Take 1 capsule by mouth daily.     . fexofenadine (ALLEGRA) 60 MG tablet Take 60 mg by mouth daily.    . fluocinonide (LIDEX) 0.05 % external solution Use 1 application on the scalp 1-2 times a week  1  . fluticasone (FLONASE) 50 MCG/ACT nasal spray Place 2 sprays into both nostrils daily.     Marland Kitchen FLUZONE HIGH-DOSE 0.5 ML SUSY   0  . ibandronate (BONIVA) 150 MG tablet Take 1 tablet (150 mg total) by mouth every 30 (thirty) days. In AM with water on an empty stomach upright and nothing by mouth the 30 min 3 tablet 6  . LORazepam (ATIVAN) 0.5 MG tablet Take 1 tablet (0.5 mg total) by mouth every 8 (eight) hours as needed for anxiety. 15 tablet 0  . Multiple Vitamin (MULTIVITAMIN) capsule Take 1 capsule by mouth daily.      . Probiotic Product (PROBIOTIC PO) Take 1 capsule by mouth daily.     . simvastatin (ZOCOR) 20 MG tablet Take 20 mg by  mouth every evening.    . Wound Dressings (RADIAGEL) GEL Apply 1 application topically daily. affected area    . zolpidem (AMBIEN) 5 MG tablet TAKE ONE-HALF TABLET BY MOUTH EVERY NIGHT AT BEDTIME AS NEEDED FOR SLEEP     No current facility-administered medications for this visit.    PHYSICAL EXAMINATION: ECOG PERFORMANCE STATUS: 0 - Asymptomatic  Filed Vitals:   12/11/14 1547  BP: 136/72  Pulse: 73  Temp: 97.6 F (36.4 C)  Resp: 20   Filed Weights   12/11/14 1547  Weight: 147 lb 4.8 oz (66.815 kg)     GENERAL:alert, no distress and comfortable SKIN: skin color, texture, turgor are normal, no rashes or significant lesions EYES: normal, Conjunctiva are pink and non-injected, sclera clear OROPHARYNX:no exudate, no erythema and lips, buccal mucosa, and tongue normal  NECK: supple, thyroid normal size, non-tender, without nodularity LYMPH:  no palpable lymphadenopathy in the cervical, axillary or inguinal LUNGS: clear to auscultation and percussion with normal breathing effort HEART: regular rate & rhythm and no murmurs and no lower extremity edema ABDOMEN:abdomen soft, non-tender and normal bowel sounds Musculoskeletal:no cyanosis of digits and no clubbing  NEURO: alert & oriented x 3 with fluent speech, no focal motor/sensory deficits  LABORATORY DATA:  I have reviewed the data as listed   Chemistry      Component Value Date/Time   NA 136* 09/10/2014 2157   K 4.0 09/10/2014 2157   CL 99 09/10/2014 2157   CO2 23 09/10/2014 2157   BUN 17 09/10/2014 2157   CREATININE 0.92 09/10/2014 2157      Component Value Date/Time   CALCIUM 9.0 09/10/2014 2157       Lab Results  Component Value Date   WBC 5.8 09/10/2014   HGB 13.8 09/10/2014   HCT 40.1 09/10/2014   MCV 99.0 09/10/2014   PLT 266 09/10/2014   NEUTROABS 4.1 06/08/2014    ASSESSMENT & PLAN:  Malignant neoplasm of lower-inner quadrant of right female breast Right breast DCIS ER/PR positive diagnosed March 2015 7 mm focus on MRI and alert lumpectomy 06/13/2014 2 mm focus of DCIS negative margins, ER 70%, PR 80% positive status post radiation therapy  Antiestrogen therapy: Patient is not very keen on antiestrogen therapy because she does not believe that benefits outweigh risks. We discussed the risk of another DCIS or invasive breast cancer is about 1% per year for DCIS. However it is difficult to determine her true risk because of how small the DCIS is. I provided her with the Lancet article regarding anastrozole versus  tamoxifen. It appears that she is leaning towards surveillance and observation rather than interventions like antiestrogen therapy.  If she decides to go on antiestrogen therapy, I would recommend tamoxifen because of her osteopenia issues.  Genetic counseling: Patient has multiple family members with breast cancer and hence I recommended genetic counseling. We will make her an appointment to see our genetics counselor. This may determine if more aggressive screening with breast MRI may be indicated.  Osteopenia: Currently on calcium and vitamin D along with Boniva started August 2015. Her plans to treat her for 2 years and then give her a two-year break.  Return to clinic in 2 months to make a final decision.    No orders of the defined types were placed in this encounter.   The patient has a good understanding of the overall plan. she agrees with it. She will call with any problems that may develop before her next visit  here.   Rulon Eisenmenger, MD

## 2014-12-12 NOTE — Progress Notes (Signed)
Exercise Treadmill Test  Pre-Exercise Testing Evaluation Rhythm: normal sinus  Rate: 81 bpm     Test  Exercise Tolerance Test Ordering MD: Thompson Grayer, MD  Interpreting MD: Truitt Merle, NP  Unique Test No: 1  Treadmill:  1  Indication for ETT: PVC'S  Contraindication to ETT: No   Stress Modality: exercise - treadmill  Cardiac Imaging Performed: non   Protocol: standard Bruce - maximal  Max BP:  163/78  Max MPHR (bpm):  152 85% MPR (bpm):  129  MPHR obtained (bpm):  148 % MPHR obtained:  97%  Reached 85% MPHR (min:sec):  6:16 Total Exercise Time (min-sec):  9 minutes  Workload in METS:  10.1 Borg Scale: 16  Reason ETT Terminated:  desired heart rate attained    ST Segment Analysis At Rest: normal ST segments - no evidence of significant ST depression With Exercise: no evidence of significant ST depression  Other Information Arrhythmia:  Yes Angina during ETT:  absent (0) Quality of ETT:  diagnostic  ETT Interpretation:  normal - no evidence of ischemia by ST analysis  Comments: Patient presents today for routine GXT. Has had episode of PVCs that necessitated ER visit.  Echo obtained which was basically normal. She has been doing ok without issue since her last visit here.   Today the patient exercised on the standard Bruce protocol for a total of 9 minutes.  Good exercise tolerance.  Adequate blood pressure response.  Clinically negative for chest pain. Test was stopped due to achievement of target HR.  EKG negative for ischemia. No significant arrhythmia noted. 2 PVCs noted at peak of exercise.   Recommendations: Echo results given to her (this was basically normal)  See back as prn.  Patient is agreeable to this plan and will call if any problems develop in the interim.   Burtis Junes, RN, [place] [address] [place], [place]  [address] [phone]

## 2014-12-20 ENCOUNTER — Encounter: Payer: Self-pay | Admitting: Genetic Counselor

## 2014-12-20 ENCOUNTER — Ambulatory Visit (HOSPITAL_BASED_OUTPATIENT_CLINIC_OR_DEPARTMENT_OTHER): Payer: Commercial Managed Care - PPO | Admitting: Genetic Counselor

## 2014-12-20 ENCOUNTER — Other Ambulatory Visit: Payer: Commercial Managed Care - PPO

## 2014-12-20 DIAGNOSIS — Z315 Encounter for genetic counseling: Secondary | ICD-10-CM

## 2014-12-20 DIAGNOSIS — C50311 Malignant neoplasm of lower-inner quadrant of right female breast: Secondary | ICD-10-CM

## 2014-12-20 DIAGNOSIS — Z800 Family history of malignant neoplasm of digestive organs: Secondary | ICD-10-CM | POA: Insufficient documentation

## 2014-12-20 DIAGNOSIS — Z8042 Family history of malignant neoplasm of prostate: Secondary | ICD-10-CM | POA: Insufficient documentation

## 2014-12-20 DIAGNOSIS — D0511 Intraductal carcinoma in situ of right breast: Secondary | ICD-10-CM

## 2014-12-20 DIAGNOSIS — Z803 Family history of malignant neoplasm of breast: Secondary | ICD-10-CM | POA: Insufficient documentation

## 2014-12-20 DIAGNOSIS — Z1379 Encounter for other screening for genetic and chromosomal anomalies: Secondary | ICD-10-CM | POA: Insufficient documentation

## 2014-12-20 NOTE — Progress Notes (Signed)
REFERRING PROVIDER: Parke Poisson, MD [place], [place] [address]   Nicholas Lose, MD  PRIMARY PROVIDER:  Parke Poisson, MD  PRIMARY REASON FOR VISIT:  1. Malignant neoplasm of lower-inner quadrant of right female breast   2. Family history of breast cancer   3. Family history of colon cancer   4. Family history of prostate cancer      HISTORY OF PRESENT ILLNESS:   Linda [name], a 69 y.o. female, was seen for a [facility] cancer genetics consultation at the request of Dr. Lindi Adie due to a personal and family history of cancer.  Linda [name] presents to clinic today to discuss the possibility of a hereditary predisposition to cancer, genetic testing, and to further clarify her future cancer risks, as well as potential cancer risks for family members.   In 2015, at the age of 61, Linda [name] was diagnosed with DCIS of the right breast. This was treated with lumpectomy and radiation.  Linda [name] had genetic testing about 15 years ago through Dr. Valeta Harms office which was reportedly negative.  We called [facility], who confirmed that she had been tested through their lab, and are in the process of sending [place] a copy of that result.   CANCER HISTORY:    Malignant neoplasm of lower-inner quadrant of right female breast   02/03/2014 Breast MRI 7 X 6X 5 mm right breast abnormality as 2 immediately adjacent 3 mm foci of enhancement   05/19/2014 Initial Diagnosis Malignant neoplasm of lower-inner quadrant of female breast DCIS   06/13/2014 Surgery R Lumpectomy DCIS 2 mm size Grade 1 Er 70%, PR 80%, Margins Neg   07/25/2014 - 08/25/2014 Radiation Therapy Adjuvant radiation therapy     HORMONAL RISK FACTORS:  Menarche was at age 83.  First live birth at age 22.  OCP use for approximately 4 years.  Ovaries intact: yes.  Hysterectomy: no.  Menopausal status: postmenopausal.  HRT use: 7 years. Colonoscopy: every 5 years based on FH; normal. Mammogram within the last  year: yes. Number of breast biopsies: 4. Up to date with pelvic exams:  yes. Any excessive radiation exposure in the past:  yes  Past Medical History  Diagnosis Date  . Hypercholesteremia   . Atrophic vaginitis   . Cervical dysplasia 1975  . Osteopenia 11/2011    T score -1.7 FRAX not calculated due to history of bisphosphonates and current Evista  . Breast cancer     right (DCIS) s/p lumpectomy 7/15  . Anxiety   . Insomnia   . Arthritis   . Skin cancer 2006    basal cell of face  . Seasonal allergies   . Hx of radiation therapy 08/02/14- 08/24/14    right reast 4256 cGy i 16 sessions, no boost  . DCIS (ductal carcinoma in situ)     RIGHT  . Cataract     BILATERAL  . Family history of breast cancer   . Family history of prostate cancer   . Family history of colon cancer     Past Surgical History  Procedure Laterality Date  . Appendectomy  1955  . Excision of basal cell ca from face  2006  . Cataract extraction      Both eyes  . Knee surgery  2013    Arthroscopic-rt  . Gynecologic cryosurgery    . Colposcopy    . Cataract surgery  2005  . Facelift  2014  . Facial cosmetic surgery    .  Breast biopsy  2010    rt bx  . Breast lumpectomy  1983    BENIGN-rt  . Breast biopsy  2009    rt  . Colonoscopy    . Breast lumpectomy with radioactive seed localization Right 06/13/2014    Procedure: RIGHT BREAST  RADIOACTIVE SEED GUIDED LUMPECTOMY;  Surgeon: Rolm Bookbinder, MD;  Location: [facility];  Service: General;  Laterality: Right;for DCIS    History   Social History  . Marital Status: Married    Spouse Name: N/A    Number of Children: N/A  . Years of Education: N/A   Social History Main Topics  . Smoking status: Former Smoker    Quit date: 05/19/1974  . Smokeless tobacco: None     Comment: smoked in college  . Alcohol Use: 3.0 oz/week    5 Not specified per week     Comment: 5-6 glasses of liquor each week  . Drug Use: No  . Sexual Activity:  Yes    Birth Control/ Protection: Post-menopausal     Comment: G4 P4    Other Topics Concern  . None   Social History Narrative   Pt lives in [place] with spouse.  4 children and 8 grandchildren.   House wife     FAMILY HISTORY:  We obtained a detailed, 4-generation family history.  Significant diagnoses are listed below: Family History  Problem Relation Age of Onset  . Breast cancer Mother     DIAGNOSED IN HER 80'S  . Hyperlipidemia Mother   . Osteoporosis Mother   . Breast cancer Sister     DIAGNOSED AT AGE 109  . Hypertension Maternal Grandmother   . Breast cancer Maternal Grandmother     possibly breast cancer; had mastectomy in her 30s-40s, but don't know if it was due to cancer  . Heart disease Maternal Grandfather   . Stroke Maternal Grandfather   . Diabetes Son     TYPE 1 DIABETES  . Colon cancer Paternal Grandfather   . Cancer Paternal Grandfather     colon  . Heart failure Son   . Kidney cancer Paternal Grandmother     bladder/kidney cancer  . Prostate cancer Maternal Uncle    Linda [name]'s mother had one brother who is living with prostate cancer.  He had 8 children who are cancer free.  Linda [name]'s father had one sister who died of lung cancer (smoker).  She also had 8 children who are cancer free. Patient's maternal ancestors are of [place] and [place] descent, and paternal ancestors are of [place] descent. There is reported Ashkenazi Jewish ancestry. There is no known consanguinity.  GENETIC COUNSELING ASSESSMENT: Linda [name] is a 69 y.o. female with a personal and fmaily history of cancer which somewhat suggestive of a hereditary cancer syndrome and predisposition to cancer. We, therefore, discussed and recommended the following at today's visit.   DISCUSSION: We reviewed the characteristics, features and inheritance patterns of hereditary cancer syndromes. We reviewed genetic testing updates over the past 15 years, and discussed mutisite testing based on  her jewish ancestry.  We also reviewed other hereditary cancer syndromes based on her family history.  We also discussed genetic testing, including the appropriate family members to test, the process of testing, insurance coverage and turn-around-time for results. We discussed the implications of a negative, positive and/or variant of uncertain significant result. We recommended Linda [name] pursue genetic testing for the [facility] gene panel. The [facility] gene panel performed by [facility] performs sequencing and deletion and duplication testing on the following 25 genes:  APC, ATM, BARD1, BMPR1A, BRCA1, BRCA2, BRIP1, CHD1, CDK4, CDKN2A, CHEK2, EPCAM (large rearrangement only), MLH1, MSH2, MSH6, MUTYH, NBN, PALB2, PMS2, PTEN, RAD51C, RAD51D, SMAD4, STK11, and TP53.  Linda [name] wondered whether she would qualify for MRI if she tested negative on genetic testing.  All risk assessments for breast cancer that I have access to are for women who have not had breast cancer.  I performed a Lilian Kapur on Linda [name], assuming that she did not have breast cancer and had a previous negative BRCA test.  Based on the patient's personal and family history, statistical models (Tyrer Cusik)  and literature data were used to estimate her risk of developing breast cancer. These estimate her lifetime risk of developing breast cancer to be approximately 23.7%. This estimation took into account her previous negative genetic testing results.  The patient's lifetime breast cancer risk is a preliminary estimate based on available information using one of several models endorsed by the [place] (ACS). The ACS recommends consideration of breast MRI screening as an adjunct to mammography for patients at high risk (defined as 20% or greater lifetime risk). A more detailed breast cancer risk assessment can be considered, if clinically indicated.   PLAN: After considering the risks, benefits, and limitations,Linda [name]   provided informed consent to pursue genetic testing and the blood sample was sent to [facility] for analysis of the [facility]. Results should be available within approximately 3-4 weeks' time, at which point they will be disclosed by telephone to Linda [name], as will any additional recommendations warranted by these results. Linda [name] will receive a summary of her genetic counseling visit and a copy of her results once available. This information will also be available in Epic. We encouraged Linda [name] to remain in contact with cancer genetics annually so that we can continuously update the family history and inform her of any changes in cancer genetics and testing that may be of benefit for her family. Linda [name] questions were answered to her satisfaction today. Our contact information was provided should additional questions or concerns arise.  Lastly, we encouraged Linda [name] to remain in contact with cancer genetics annually so that we can continuously update the family history and inform her of any changes in cancer genetics and testing that may be of benefit for this family.   Ms.  [name] questions were answered to her satisfaction today. Our contact information was provided should additional questions or concerns arise. Thank you for the referral and allowing [place] to share in the care of your patient.   Karen P. Florene Glen, [place], [facility] Certified Genetic Counselor [email or web address] phone: [phone]  The patient was seen for a total of 60 minutes in face-to-face genetic counseling.  This patient was discussed with Drs. Magrinat, Lindi Adie and/or Burr Medico who agrees with the above.    _______________________________________________________________________ For Office Staff:  Number of people involved in session: 1 Was an Intern/ student involved with case: no

## 2015-01-02 ENCOUNTER — Other Ambulatory Visit: Payer: Self-pay

## 2015-01-02 ENCOUNTER — Encounter: Payer: Self-pay | Admitting: Hematology and Oncology

## 2015-01-02 ENCOUNTER — Other Ambulatory Visit: Payer: Self-pay | Admitting: Hematology and Oncology

## 2015-01-02 DIAGNOSIS — Z853 Personal history of malignant neoplasm of breast: Secondary | ICD-10-CM

## 2015-01-02 DIAGNOSIS — C50311 Malignant neoplasm of lower-inner quadrant of right female breast: Secondary | ICD-10-CM

## 2015-01-02 DIAGNOSIS — Z1231 Encounter for screening mammogram for malignant neoplasm of breast: Secondary | ICD-10-CM

## 2015-01-02 NOTE — Progress Notes (Signed)
MyChart message sent to pt re: MRI order entered.

## 2015-01-03 ENCOUNTER — Telehealth: Payer: Self-pay | Admitting: Genetic Counselor

## 2015-01-03 NOTE — Telephone Encounter (Signed)
LM on VM with good news on genetic test results.

## 2015-01-04 ENCOUNTER — Telehealth: Payer: Self-pay | Admitting: Genetic Counselor

## 2015-01-04 NOTE — Telephone Encounter (Signed)
LM on VM with good news on genetic test results.

## 2015-01-12 ENCOUNTER — Telehealth: Payer: Self-pay | Admitting: Genetic Counselor

## 2015-01-12 NOTE — Telephone Encounter (Signed)
LM on VM with good news on reuslts.  Asked that she call back.  This is the 3rd phone call to her in regards to results.  Will send letter with test results to patients home.

## 2015-01-12 NOTE — Progress Notes (Signed)
error 

## 2015-01-13 ENCOUNTER — Other Ambulatory Visit: Payer: Self-pay | Admitting: Hematology and Oncology

## 2015-01-15 ENCOUNTER — Other Ambulatory Visit: Payer: Self-pay | Admitting: *Deleted

## 2015-01-15 DIAGNOSIS — C50311 Malignant neoplasm of lower-inner quadrant of right female breast: Secondary | ICD-10-CM

## 2015-01-15 MED ORDER — VAGIFEM 10 MCG VA TABS: ORAL_TABLET | VAGINAL | Status: DC

## 2015-01-16 ENCOUNTER — Encounter: Payer: Self-pay | Admitting: Genetic Counselor

## 2015-01-29 ENCOUNTER — Ambulatory Visit
Admission: RE | Admit: 2015-01-29 | Discharge: 2015-01-29 | Disposition: A | Discharge status: Home / Self Care | Payer: Commercial Managed Care - PPO | Source: Ambulatory Visit | Attending: Hematology and Oncology | Admitting: Hematology and Oncology

## 2015-01-29 ENCOUNTER — Other Ambulatory Visit: Payer: Self-pay | Admitting: Hematology and Oncology

## 2015-01-29 ENCOUNTER — Ambulatory Visit: Payer: Commercial Managed Care - PPO

## 2015-01-29 DIAGNOSIS — Z853 Personal history of malignant neoplasm of breast: Secondary | ICD-10-CM

## 2015-02-02 ENCOUNTER — Other Ambulatory Visit: Payer: Commercial Managed Care - PPO

## 2015-02-05 ENCOUNTER — Telehealth: Payer: Self-pay | Admitting: Hematology and Oncology

## 2015-02-05 NOTE — Telephone Encounter (Signed)
Ret pt call as she would like to r.s her appt   Linda [name]

## 2015-02-06 ENCOUNTER — Ambulatory Visit
Admission: RE | Admit: 2015-02-06 | Discharge: 2015-02-06 | Disposition: A | Discharge status: Home / Self Care | Payer: Commercial Managed Care - PPO | Source: Ambulatory Visit | Attending: Hematology and Oncology | Admitting: Hematology and Oncology

## 2015-02-06 DIAGNOSIS — C50311 Malignant neoplasm of lower-inner quadrant of right female breast: Secondary | ICD-10-CM

## 2015-02-06 MED ORDER — GADOBENATE DIMEGLUMINE 529 MG/ML IV SOLN: 13.0000 mL | Freq: Once | INTRAVENOUS | Status: AC | PRN

## 2015-02-08 ENCOUNTER — Ambulatory Visit: Payer: Commercial Managed Care - PPO | Admitting: Hematology and Oncology

## 2015-02-16 ENCOUNTER — Ambulatory Visit (HOSPITAL_BASED_OUTPATIENT_CLINIC_OR_DEPARTMENT_OTHER): Payer: Commercial Managed Care - PPO | Admitting: Hematology and Oncology

## 2015-02-16 ENCOUNTER — Telehealth: Payer: Self-pay | Admitting: Hematology and Oncology

## 2015-02-16 DIAGNOSIS — M8580 Other specified disorders of bone density and structure, unspecified site: Secondary | ICD-10-CM

## 2015-02-16 DIAGNOSIS — Z170 Estrogen receptor positive status [ER+]: Secondary | ICD-10-CM

## 2015-02-16 DIAGNOSIS — D0511 Intraductal carcinoma in situ of right breast: Secondary | ICD-10-CM | POA: Diagnosis not present

## 2015-02-16 DIAGNOSIS — C50311 Malignant neoplasm of lower-inner quadrant of right female breast: Secondary | ICD-10-CM

## 2015-02-16 NOTE — Telephone Encounter (Signed)
Gave patient avs report and appointments for march 2017 including mammo at [facility]. Patient given number to call [facility] to complete the screening for 2017 mri and obtained appointment.

## 2015-02-16 NOTE — Progress Notes (Signed)
Patient Care Team: Parke Poisson, MD as PCP - General (Internal Medicine)  DIAGNOSIS: Malignant neoplasm of lower-inner quadrant of right female breast   Staging form: Breast, AJCC 7th Edition     Clinical: Stage 0 (Tis, N0, cM0) - Signed by Rexene Edison, MD on 05/19/2014     Pathologic: Stage 0 (Tis, NX, cM0) - Signed by Rolm Bookbinder, MD on 06/15/2014   SUMMARY OF ONCOLOGIC HISTORY:   Malignant neoplasm of lower-inner quadrant of right female breast   02/03/2014 Breast MRI 7 X 6X 5 mm right breast abnormality as 2 immediately adjacent 3 mm foci of enhancement   05/19/2014 Initial Diagnosis Malignant neoplasm of lower-inner quadrant of female breast DCIS   06/13/2014 Surgery R Lumpectomy DCIS 2 mm size Grade 1 Er 70%, PR 80%, Margins Neg   07/25/2014 - 08/25/2014 Radiation Therapy Adjuvant radiation therapy   10/18/2014 Procedure genetic testing was normal no mutations identified    CHIEF COMPLIANT: follow-up of DCIS  INTERVAL HISTORY: Linda [name] is a  69 year old lady with above-mentioned history of DCIS underwent lumpectomy and radiation. She refused antiestrogen therapy after weighing the risks and benefits. She is now on active surveillance. Because of her risk of breast cancer recurrences or 20% via pursuing breast MRI along with a mammogram annually. Most recent mammogram and MRIs were all normal. She does not report any lumps or nodules in the breasts.  REVIEW OF SYSTEMS:   Constitutional: Denies fevers, chills or abnormal weight loss Eyes: Denies blurriness of vision Ears, nose, mouth, throat, and face: Denies mucositis or sore throat Respiratory: Denies cough, dyspnea or wheezes Cardiovascular: Denies palpitation, chest discomfort or lower extremity swelling Gastrointestinal:  Denies nausea, heartburn or change in bowel habits Skin: Denies abnormal skin rashes Lymphatics: Denies new lymphadenopathy or easy bruising Neurological:Denies numbness, tingling or new  weaknesses Behavioral/Psych: Mood is stable, no new changes  Breast:  denies any pain or lumps or nodules in either breasts All other systems were reviewed with the patient and are negative.  I have reviewed the past medical history, past surgical history, social history and family history with the patient and they are unchanged from previous note.  ALLERGIES:  has No Known Allergies.  MEDICATIONS:  Current Outpatient Prescriptions  Medication Sig Dispense Refill  . ALPRAZolam (XANAX) 0.25 MG tablet Take 0.25 mg by mouth at bedtime as needed for sleep.     Marland Kitchen aspirin 81 MG tablet Take 81 mg by mouth daily.    Marland Kitchen CALCIUM-VITAMIN D PO Chew 1 tablet by mouth twice a day    . fluocinonide (LIDEX) 0.05 % external solution Use 1 application on the scalp 1-2 times a week  1  . fluticasone (FLONASE) 50 MCG/ACT nasal spray Place 2 sprays into both nostrils daily.     Marland Kitchen ibandronate (BONIVA) 150 MG tablet Take 1 tablet (150 mg total) by mouth every 30 (thirty) days. In AM with water on an empty stomach upright and nothing by mouth the 30 min 3 tablet 6  . loratadine (CLARITIN) 10 MG tablet Take 10 mg by mouth daily as needed for allergies.    . Multiple Vitamin (MULTIVITAMIN) capsule Take 1 capsule by mouth daily.      . Probiotic Product (PROBIOTIC PO) Take 1 capsule by mouth daily.     . simvastatin (ZOCOR) 20 MG tablet Take 20 mg by mouth every evening.    . zolpidem (AMBIEN) 5 MG tablet TAKE ONE-HALF TABLET BY MOUTH EVERY NIGHT AT BEDTIME AS  NEEDED FOR SLEEP     No current facility-administered medications for this visit.    PHYSICAL EXAMINATION: ECOG PERFORMANCE STATUS: 1 - Symptomatic but completely ambulatory  Filed Vitals:   02/16/15 0958  BP: 119/91  Pulse: 74  Temp: 97.5 F (36.4 C)  Resp: 18   Filed Weights   02/16/15 0958  Weight: 149 lb 12.8 oz (67.949 kg)    GENERAL:alert, no distress and comfortable SKIN: skin color, texture, turgor are normal, no rashes or significant  lesions EYES: normal, Conjunctiva are pink and non-injected, sclera clear OROPHARYNX:no exudate, no erythema and lips, buccal mucosa, and tongue normal  NECK: supple, thyroid normal size, non-tender, without nodularity LYMPH:  no palpable lymphadenopathy in the cervical, axillary or inguinal LUNGS: clear to auscultation and percussion with normal breathing effort HEART: regular rate & rhythm and no murmurs and no lower extremity edema ABDOMEN:abdomen soft, non-tender and normal bowel sounds Musculoskeletal:no cyanosis of digits and no clubbing  NEURO: alert & oriented x 3 with fluent speech, no focal motor/sensory deficits BREAST: No palpable masses or nodules in either right or left breasts. No palpable axillary supraclavicular or infraclavicular adenopathy no breast tenderness or nipple discharge. (exam performed in the presence of a chaperone)  LABORATORY DATA:  I have reviewed the data as listed   Chemistry      Component Value Date/Time   NA 136* 09/10/2014 2157   K 4.0 09/10/2014 2157   CL 99 09/10/2014 2157   CO2 23 09/10/2014 2157   BUN 17 09/10/2014 2157   CREATININE 0.92 09/10/2014 2157      Component Value Date/Time   CALCIUM 9.0 09/10/2014 2157       Lab Results  Component Value Date   WBC 5.8 09/10/2014   HGB 13.8 09/10/2014   HCT 40.1 09/10/2014   MCV 99.0 09/10/2014   PLT 266 09/10/2014   NEUTROABS 4.1 06/08/2014   ASSESSMENT & PLAN:  Malignant neoplasm of lower-inner quadrant of right female breast Right breast DCIS ER/PR positive diagnosed March 2015 7 mm focus on MRI and alert lumpectomy 06/13/2014 2 mm focus of DCIS negative margins, ER 70%, PR 80% positive status post radiation therapy; patient was offered antiestrogen therapy but she refused and is currently on surveillance  Breast cancer surveillance: Because her risk of breast cancer is greater than 20% we decided to do surveillance with 1. Annual mammograms 2. Annual MRIs I reviewed the mammogram  and MRI done in March 2016 which were normal. Breast examination done 02/16/2015 is normal.  Osteopenia: Bone density done October 2015 showed a T score of -1.8. Continue with calcium and vitamin D and Boniva monthly. We'll plan to treat her until August 2017 and give her a treatment break for 2 years and then resume. We will plan to do bone densities every other year.  Return to clinic in 1 year after mammogram and MRI.     Orders Placed This Encounter  Procedures  . MR Breast Bilateral Wo Contrast    Standing Status: Future     Number of Occurrences:      Standing Expiration Date: 02/16/2016    Order Specific Question:  Reason for Exam (SYMPTOM  OR DIAGNOSIS REQUIRED)    Answer:  Annual evaluation of DCIS 20% Risk of breast cancer    Order Specific Question:  Preferred imaging location?    Answer:  [facility]    Order Specific Question:  Does the patient have a pacemaker or implanted devices?  Answer:  No    Order Specific Question:  What is the patient's sedation requirement?    Answer:  No Sedation  . MR Breast Bilateral W Contrast    Standing Status: Future     Number of Occurrences:      Standing Expiration Date: 02/16/2016    Order Specific Question:  Reason for Exam (SYMPTOM  OR DIAGNOSIS REQUIRED)    Answer:  Annual evaluation of DCIS 20% Risk of breast cancer    Order Specific Question:  Preferred imaging location?    Answer:  [facility]    Order Specific Question:  Does the patient have a pacemaker or implanted devices?    Answer:  No    Order Specific Question:  What is the patient's sedation requirement?    Answer:  No Sedation  . MM DIAG BREAST TOMO BILATERAL    Annual evaluation of DCIS 20% Risk of breast cancer/PREV RIGHT LUMPECTOMY BREAST CANCER 2015/NO IMPLANTS OR REDUCTIONS/01/29/2015 [facility]//NO NEEDS//[facility]//MELISSA [id number]//PT REQUESTED TOMO [facility]    Standing Status: Future     Number of Occurrences:      Standing Expiration Date:  04/17/2016    Order Specific Question:  Reason for Exam (SYMPTOM  OR DIAGNOSIS REQUIRED)    Answer:  Annual evaluation of DCIS 20% Risk of breast cancer    Order Specific Question:  Preferred imaging location?    Answer:  [facility]   The patient has a good understanding of the overall plan. she agrees with it. She will call with any problems that may develop before her next visit here.   Rulon Eisenmenger, MD

## 2015-02-16 NOTE — Assessment & Plan Note (Signed)
Right breast DCIS ER/PR positive diagnosed March 2015 7 mm focus on MRI and alert lumpectomy 06/13/2014 2 mm focus of DCIS negative margins, ER 70%, PR 80% positive status post radiation therapy; patient was offered antiestrogen therapy but she refused and is currently on surveillance  Breast cancer surveillance: Because her risk of breast cancer is greater than 20% we decided to do surveillance with 1. Annual mammograms 2. Annual MRIs I reviewed the mammogram and MRI done in March 2016 which were normal. Breast examination done 02/16/2015 is normal.  Osteopenia: Bone density done October 2015 showed a T score of -1.8. Continue with calcium and vitamin D and Boniva monthly. We'll plan to treat her until August 2017 and give her a treatment break for 2 years and then resume. We will plan to do bone densities every other year.  Return to clinic in 1 year after mammogram and MRI.

## 2015-03-28 ENCOUNTER — Encounter: Payer: Self-pay | Admitting: Internal Medicine

## 2015-05-21 ENCOUNTER — Other Ambulatory Visit: Payer: Self-pay

## 2015-08-08 ENCOUNTER — Ambulatory Visit (INDEPENDENT_AMBULATORY_CARE_PROVIDER_SITE_OTHER): Payer: Commercial Managed Care - PPO | Admitting: Licensed Clinical Social Worker

## 2015-08-08 DIAGNOSIS — F411 Generalized anxiety disorder: Secondary | ICD-10-CM | POA: Diagnosis not present

## 2015-08-21 ENCOUNTER — Other Ambulatory Visit: Payer: Self-pay | Admitting: Hematology and Oncology

## 2015-08-21 DIAGNOSIS — C50311 Malignant neoplasm of lower-inner quadrant of right female breast: Secondary | ICD-10-CM

## 2015-08-31 ENCOUNTER — Ambulatory Visit: Payer: Commercial Managed Care - PPO | Admitting: Licensed Clinical Social Worker

## 2015-09-12 ENCOUNTER — Ambulatory Visit (INDEPENDENT_AMBULATORY_CARE_PROVIDER_SITE_OTHER): Payer: Commercial Managed Care - PPO | Admitting: Licensed Clinical Social Worker

## 2015-09-12 DIAGNOSIS — F411 Generalized anxiety disorder: Secondary | ICD-10-CM | POA: Diagnosis not present

## 2015-10-17 ENCOUNTER — Ambulatory Visit (INDEPENDENT_AMBULATORY_CARE_PROVIDER_SITE_OTHER): Payer: Commercial Managed Care - PPO | Admitting: Licensed Clinical Social Worker

## 2015-10-17 DIAGNOSIS — F411 Generalized anxiety disorder: Secondary | ICD-10-CM

## 2015-11-21 ENCOUNTER — Ambulatory Visit: Payer: Commercial Managed Care - PPO | Admitting: Licensed Clinical Social Worker

## 2015-11-28 ENCOUNTER — Ambulatory Visit (INDEPENDENT_AMBULATORY_CARE_PROVIDER_SITE_OTHER): Payer: Commercial Managed Care - PPO | Admitting: Licensed Clinical Social Worker

## 2015-11-28 DIAGNOSIS — F411 Generalized anxiety disorder: Secondary | ICD-10-CM | POA: Diagnosis not present

## 2015-12-26 ENCOUNTER — Ambulatory Visit (INDEPENDENT_AMBULATORY_CARE_PROVIDER_SITE_OTHER): Payer: Commercial Managed Care - PPO | Admitting: Licensed Clinical Social Worker

## 2015-12-26 DIAGNOSIS — F411 Generalized anxiety disorder: Secondary | ICD-10-CM

## 2015-12-27 DIAGNOSIS — L661 Lichen planopilaris, unspecified: Secondary | ICD-10-CM | POA: Insufficient documentation

## 2015-12-27 DIAGNOSIS — L6612 Frontal fibrosing alopecia: Secondary | ICD-10-CM | POA: Insufficient documentation

## 2016-01-28 ENCOUNTER — Ambulatory Visit (INDEPENDENT_AMBULATORY_CARE_PROVIDER_SITE_OTHER): Payer: Commercial Managed Care - PPO | Admitting: Licensed Clinical Social Worker

## 2016-01-28 DIAGNOSIS — F411 Generalized anxiety disorder: Secondary | ICD-10-CM

## 2016-01-30 ENCOUNTER — Ambulatory Visit
Admission: RE | Admit: 2016-01-30 | Discharge: 2016-01-30 | Disposition: A | Discharge status: Home / Self Care | Payer: Commercial Managed Care - PPO | Source: Ambulatory Visit | Attending: Hematology and Oncology | Admitting: Hematology and Oncology

## 2016-01-30 ENCOUNTER — Other Ambulatory Visit: Payer: Self-pay

## 2016-01-30 ENCOUNTER — Other Ambulatory Visit: Payer: Self-pay | Admitting: Hematology and Oncology

## 2016-01-30 DIAGNOSIS — C50311 Malignant neoplasm of lower-inner quadrant of right female breast: Secondary | ICD-10-CM

## 2016-02-07 ENCOUNTER — Ambulatory Visit (HOSPITAL_BASED_OUTPATIENT_CLINIC_OR_DEPARTMENT_OTHER): Payer: Commercial Managed Care - PPO | Admitting: Hematology and Oncology

## 2016-02-07 ENCOUNTER — Telehealth: Payer: Self-pay | Admitting: Hematology and Oncology

## 2016-02-07 ENCOUNTER — Encounter: Payer: Self-pay | Admitting: Hematology and Oncology

## 2016-02-07 VITALS — BP 120/73 | HR 78 | Temp 97.8°F | Resp 18 | Ht 66.0 in | Wt 151.5 lb

## 2016-02-07 DIAGNOSIS — D0511 Intraductal carcinoma in situ of right breast: Secondary | ICD-10-CM

## 2016-02-07 DIAGNOSIS — C50311 Malignant neoplasm of lower-inner quadrant of right female breast: Secondary | ICD-10-CM

## 2016-02-07 MED ORDER — DOXYCYCLINE HYCLATE 100 MG PO TABS: 50.0000 mg | ORAL_TABLET | Freq: Two times a day (BID) | ORAL | Status: DC

## 2016-02-07 MED ORDER — CITALOPRAM HYDROBROMIDE 20 MG PO TABS: 20.0000 mg | ORAL_TABLET | Freq: Every day | ORAL | Status: DC

## 2016-02-07 MED ORDER — HYDROXYCHLOROQUINE SULFATE 200 MG PO TABS: 200.0000 mg | ORAL_TABLET | Freq: Two times a day (BID) | ORAL | Status: DC

## 2016-02-07 NOTE — Progress Notes (Signed)
Patient Care Team: Parke Poisson, MD as PCP - General (Internal Medicine)  DIAGNOSIS: Malignant neoplasm of lower-inner quadrant of right female breast [facility])   Staging form: Breast, AJCC 7th Edition     Clinical: Stage 0 (Tis, N0, cM0) - Signed by Rexene Edison, MD on 05/19/2014     Pathologic: Stage 0 (Tis, NX, cM0) - Signed by Rolm Bookbinder, MD on 06/15/2014   SUMMARY OF ONCOLOGIC HISTORY:   Malignant neoplasm of lower-inner quadrant of right female breast ([place])   02/03/2014 Breast MRI 7 X 6X 5 mm right breast abnormality as 2 immediately adjacent 3 mm foci of enhancement   05/19/2014 Initial Diagnosis Malignant neoplasm of lower-inner quadrant of female breast DCIS   06/13/2014 Surgery R Lumpectomy DCIS 2 mm size Grade 1 Er 70%, PR 80%, Margins Neg   07/25/2014 - 08/25/2014 Radiation Therapy Adjuvant radiation therapy   10/18/2014 Procedure genetic testing was normal no mutations identified    CHIEF COMPLIANT: Follow-up of right breast DCIS with the new calcifications in the left breast  INTERVAL HISTORY: Linda [name] is a 70 year old above-mentioned history of right breast DCIS was currently on surveillance and had a recent mammogram which revealed abnormalities in the left breast. Because she is scheduled for MRI tomorrow they're planning to wait for the MRI report before scheduling her for a biopsy.  REVIEW OF SYSTEMS:   Constitutional: Denies fevers, chills or abnormal weight loss Eyes: Denies blurriness of vision Ears, nose, mouth, throat, and face: Denies mucositis or sore throat Respiratory: Denies cough, dyspnea or wheezes Cardiovascular: Denies palpitation, chest discomfort Gastrointestinal:  Denies nausea, heartburn or change in bowel habits Skin: Denies abnormal skin rashes Lymphatics: Denies new lymphadenopathy or easy bruising Neurological:Denies numbness, tingling or new weaknesses Behavioral/Psych: Mood is stable, no new changes  Extremities: No lower  extremity edema Breast:  denies any pain or lumps or nodules in either breasts All other systems were reviewed with the patient and are negative.  I have reviewed the past medical history, past surgical history, social history and family history with the patient and they are unchanged from previous note.  ALLERGIES:  has No Known Allergies.  MEDICATIONS:  Current Outpatient Prescriptions  Medication Sig Dispense Refill  . ALPRAZolam (XANAX) 0.25 MG tablet Take 0.25 mg by mouth at bedtime as needed for sleep.     Marland Kitchen aspirin 81 MG tablet Take 81 mg by mouth daily.    Marland Kitchen CALCIUM-VITAMIN D PO Chew 1 tablet by mouth twice a day    . fluocinonide (LIDEX) 0.05 % external solution Use 1 application on the scalp 1-2 times a week  1  . fluticasone (FLONASE) 50 MCG/ACT nasal spray Place 2 sprays into both nostrils daily.     Marland Kitchen ibandronate (BONIVA) 150 MG tablet TAKE 1 TABLET BY MOUTH EVERY 30 DAYS IN THE MORNING WITH WATER ON EMPTY STOMACH UPRIGHT AND NOTHING IN MOUTH FOR 30 MINUTES. 3 tablet 3  . loratadine (CLARITIN) 10 MG tablet Take 10 mg by mouth daily as needed for allergies.    . Multiple Vitamin (MULTIVITAMIN) capsule Take 1 capsule by mouth daily.      . Probiotic Product (PROBIOTIC PO) Take 1 capsule by mouth daily.     . simvastatin (ZOCOR) 20 MG tablet Take 20 mg by mouth every evening.    . zolpidem (AMBIEN) 5 MG tablet TAKE ONE-HALF TABLET BY MOUTH EVERY NIGHT AT BEDTIME AS NEEDED FOR SLEEP     No current facility-administered medications for  this visit.    PHYSICAL EXAMINATION: ECOG PERFORMANCE STATUS: 0 - Asymptomatic  Filed Vitals:   02/07/16 1118  BP: 120/73  Pulse: 78  Temp: 97.8 F (36.6 C)  Resp: 18   Filed Weights   02/07/16 1118  Weight: 151 lb 8 oz (68.72 kg)    GENERAL:alert, no distress and comfortable SKIN: skin color, texture, turgor are normal, no rashes or significant lesions EYES: normal, Conjunctiva are pink and non-injected, sclera clear OROPHARYNX:no  exudate, no erythema and lips, buccal mucosa, and tongue normal  NECK: supple, thyroid normal size, non-tender, without nodularity LYMPH:  no palpable lymphadenopathy in the cervical, axillary or inguinal LUNGS: clear to auscultation and percussion with normal breathing effort HEART: regular rate & rhythm and no murmurs and no lower extremity edema ABDOMEN:abdomen soft, non-tender and normal bowel sounds MUSCULOSKELETAL:no cyanosis of digits and no clubbing  NEURO: alert & oriented x 3 with fluent speech, no focal motor/sensory deficits EXTREMITIES: No lower extremity edema BREAST: No palpable masses or nodules in either right or left breasts. No palpable axillary supraclavicular or infraclavicular adenopathy no breast tenderness or nipple discharge. (exam performed in the presence of a chaperone)  LABORATORY DATA:  I have reviewed the data as listed   Chemistry      Component Value Date/Time   NA 136* 09/10/2014 2157   K 4.0 09/10/2014 2157   CL 99 09/10/2014 2157   CO2 23 09/10/2014 2157   BUN 17 09/10/2014 2157   CREATININE 0.92 09/10/2014 2157      Component Value Date/Time   CALCIUM 9.0 09/10/2014 2157       Lab Results  Component Value Date   WBC 5.8 09/10/2014   HGB 13.8 09/10/2014   HCT 40.1 09/10/2014   MCV 99.0 09/10/2014   PLT 266 09/10/2014   NEUTROABS 4.1 06/08/2014     ASSESSMENT & PLAN:  Malignant neoplasm of lower-inner quadrant of right female breast Right breast DCIS ER/PR positive diagnosed March 2015 7 mm focus on MRI and alert lumpectomy 06/13/2014 2 mm focus of DCIS negative margins, ER 70%, PR 80% positive status post radiation therapy; patient was offered antiestrogen therapy but she refused and is currently on surveillance  Breast cancer surveillance: Because her risk of breast cancer is greater than 20% we decided to do surveillance with 1. Annual mammograms: 01/30/2016: Irregular hypoechoic mass measuring 7 x 2 x 2 mm at 1:00 position left  breast: Patient will be undergoing breast MRI on the 17th of March and will require a biopsy of this area and surgical opinion depending on the pathology findings. 2. Annual MRIs to be done tomorrow 3. Breast examination done 02/07/2016 is normal.  We will await the results of MRI and biopsy to determine further treatment options. If she does have recurrent DCIS then she will undergo lumpectomy and might consider taking adjuvant antiestrogen therapy with radiation.  Osteopenia: Bone density done October 2015 showed a T score of -1.8. Continue with calcium and vitamin D and Boniva monthly. We'll plan to treat her until August 2017 and give her a treatment break for 2 years and then resume. We will plan to do bone densities every other year.  Return to clinic in 1 year after mammogram and MRI in the current biopsy being planned is normal.   No orders of the defined types were placed in this encounter.   The patient has a good understanding of the overall plan. she agrees with it. she will call with any problems  that may develop before the next visit here.   Rulon Eisenmenger, MD 02/07/2016

## 2016-02-07 NOTE — Telephone Encounter (Signed)
appt made and avs printed °

## 2016-02-07 NOTE — Assessment & Plan Note (Signed)
Right breast DCIS ER/PR positive diagnosed March 2015 7 mm focus on MRI and alert lumpectomy 06/13/2014 2 mm focus of DCIS negative margins, ER 70%, PR 80% positive status post radiation therapy; patient was offered antiestrogen therapy but she refused and is currently on surveillance  Breast cancer surveillance: Because her risk of breast cancer is greater than 20% we decided to do surveillance with 1. Annual mammograms: 01/30/2016: Irregular hypoechoic mass measuring 7 x 2 x 2 mm at 1:00 position left breast: Patient will be undergoing breast MRI on the 17th of March and will require a biopsy of this area and surgical opinion depending on the pathology findings. 2. Annual MRIs to be done tomorrow  Breast examination done 02/07/2016 is normal.  Osteopenia: Bone density done October 2015 showed a T score of -1.8. Continue with calcium and vitamin D and Boniva monthly. We'll plan to treat her until August 2017 and give her a treatment break for 2 years and then resume. We will plan to do bone densities every other year.  Return to clinic in 1 year after mammogram and MRI.

## 2016-02-08 ENCOUNTER — Ambulatory Visit
Admission: RE | Admit: 2016-02-08 | Discharge: 2016-02-08 | Disposition: A | Discharge status: Home / Self Care | Payer: Commercial Managed Care - PPO | Source: Ambulatory Visit | Attending: Hematology and Oncology | Admitting: Hematology and Oncology

## 2016-02-08 DIAGNOSIS — C50311 Malignant neoplasm of lower-inner quadrant of right female breast: Secondary | ICD-10-CM

## 2016-02-08 MED ORDER — GADOBENATE DIMEGLUMINE 529 MG/ML IV SOLN
14.0000 mL | Freq: Once | INTRAVENOUS | Status: AC | PRN
  Administered 2016-02-08: 14 mL via INTRAVENOUS

## 2016-02-25 ENCOUNTER — Ambulatory Visit (INDEPENDENT_AMBULATORY_CARE_PROVIDER_SITE_OTHER): Payer: Commercial Managed Care - PPO | Admitting: Licensed Clinical Social Worker

## 2016-02-25 DIAGNOSIS — F411 Generalized anxiety disorder: Secondary | ICD-10-CM | POA: Diagnosis not present

## 2016-04-14 ENCOUNTER — Ambulatory Visit (INDEPENDENT_AMBULATORY_CARE_PROVIDER_SITE_OTHER): Payer: Commercial Managed Care - PPO | Admitting: Licensed Clinical Social Worker

## 2016-04-14 DIAGNOSIS — F419 Anxiety disorder, unspecified: Secondary | ICD-10-CM

## 2016-05-30 ENCOUNTER — Ambulatory Visit (INDEPENDENT_AMBULATORY_CARE_PROVIDER_SITE_OTHER): Payer: Commercial Managed Care - PPO | Admitting: Licensed Clinical Social Worker

## 2016-05-30 DIAGNOSIS — F411 Generalized anxiety disorder: Secondary | ICD-10-CM

## 2016-06-02 ENCOUNTER — Ambulatory Visit: Payer: Commercial Managed Care - PPO | Admitting: Licensed Clinical Social Worker

## 2016-06-27 ENCOUNTER — Encounter: Payer: Self-pay | Admitting: Hematology and Oncology

## 2016-07-09 ENCOUNTER — Other Ambulatory Visit: Payer: Self-pay | Admitting: Internal Medicine

## 2016-07-09 DIAGNOSIS — M8580 Other specified disorders of bone density and structure, unspecified site: Secondary | ICD-10-CM

## 2016-07-14 ENCOUNTER — Encounter: Payer: Self-pay | Admitting: Gastroenterology

## 2016-11-24 HISTORY — PX: BREAST EXCISIONAL BIOPSY: SUR124

## 2016-12-30 ENCOUNTER — Encounter: Payer: Self-pay | Admitting: Hematology and Oncology

## 2016-12-30 ENCOUNTER — Other Ambulatory Visit: Payer: Self-pay | Admitting: Hematology and Oncology

## 2016-12-30 DIAGNOSIS — Z853 Personal history of malignant neoplasm of breast: Secondary | ICD-10-CM

## 2017-01-05 ENCOUNTER — Encounter: Payer: Self-pay | Admitting: Hematology and Oncology

## 2017-01-12 ENCOUNTER — Other Ambulatory Visit: Payer: Self-pay

## 2017-01-12 DIAGNOSIS — C50311 Malignant neoplasm of lower-inner quadrant of right female breast: Secondary | ICD-10-CM

## 2017-02-04 ENCOUNTER — Ambulatory Visit
Admission: RE | Admit: 2017-02-04 | Discharge: 2017-02-04 | Disposition: A | Discharge status: Home / Self Care | Payer: Commercial Managed Care - PPO | Source: Ambulatory Visit | Attending: Hematology and Oncology | Admitting: Hematology and Oncology

## 2017-02-04 ENCOUNTER — Other Ambulatory Visit: Payer: Self-pay | Admitting: Hematology and Oncology

## 2017-02-04 DIAGNOSIS — Z853 Personal history of malignant neoplasm of breast: Secondary | ICD-10-CM

## 2017-02-04 DIAGNOSIS — N6489 Other specified disorders of breast: Secondary | ICD-10-CM

## 2017-02-04 HISTORY — DX: Personal history of irradiation: Z92.3

## 2017-02-05 ENCOUNTER — Ambulatory Visit
Admission: RE | Admit: 2017-02-05 | Discharge: 2017-02-05 | Disposition: A | Discharge status: Home / Self Care | Payer: Commercial Managed Care - PPO | Source: Ambulatory Visit | Attending: Hematology and Oncology | Admitting: Hematology and Oncology

## 2017-02-05 DIAGNOSIS — C50311 Malignant neoplasm of lower-inner quadrant of right female breast: Secondary | ICD-10-CM

## 2017-02-05 MED ORDER — GADOBENATE DIMEGLUMINE 529 MG/ML IV SOLN
14.0000 mL | Freq: Once | INTRAVENOUS | Status: AC | PRN
  Administered 2017-02-05: 14 mL via INTRAVENOUS

## 2017-02-06 ENCOUNTER — Ambulatory Visit (HOSPITAL_BASED_OUTPATIENT_CLINIC_OR_DEPARTMENT_OTHER): Payer: Commercial Managed Care - PPO | Admitting: Hematology and Oncology

## 2017-02-06 ENCOUNTER — Encounter: Payer: Self-pay | Admitting: Hematology and Oncology

## 2017-02-06 ENCOUNTER — Telehealth: Payer: Self-pay | Admitting: Hematology and Oncology

## 2017-02-06 DIAGNOSIS — M8580 Other specified disorders of bone density and structure, unspecified site: Secondary | ICD-10-CM | POA: Diagnosis not present

## 2017-02-06 DIAGNOSIS — Z170 Estrogen receptor positive status [ER+]: Secondary | ICD-10-CM

## 2017-02-06 DIAGNOSIS — D0511 Intraductal carcinoma in situ of right breast: Secondary | ICD-10-CM

## 2017-02-06 DIAGNOSIS — C50311 Malignant neoplasm of lower-inner quadrant of right female breast: Secondary | ICD-10-CM

## 2017-02-06 MED ORDER — SERTRALINE HCL 50 MG PO TABS: 50.0000 mg | ORAL_TABLET | Freq: Every day | ORAL | Status: DC

## 2017-02-06 NOTE — Assessment & Plan Note (Signed)
Right breast DCIS ER/PR positive diagnosed March 2015 7 mm focus on MRI and alert lumpectomy 06/13/2014 2 mm focus of DCIS negative margins, ER 70%, PR 80% positive status post radiation therapy; patient was offered antiestrogen therapy but she refused and is currently on surveillance  Breast cancer surveillance: Because her risk of breast cancer is greater than 20% we decided to do surveillance with 1. Annual mammograms: 02/04/2017: Suspicious left breast distortion 2. Annual MRIs 02/05/2017: Left breast spiculated masslike enhancement 3.6 cm with non-mass enhancement extending from the nipple to the posterior breast measuring 3.2 x 6.4 cm the lateral upper and lateral lower left breast, no abnormal lymph nodes  Patient had slight abnormality in the mammogram in 2017 in the left breast but this did not show up on the breast MRI. So no biopsy was performed at that time.  We will await the results of biopsy to determine further treatment options. If she does have recurrent DCIS then she will undergo lumpectomy and might consider taking adjuvant antiestrogen therapy with radiation.  Osteopenia: Bone density done October 2015 showed a T score of -1.8. Continue with calcium and vitamin D and Boniva monthly.   Patient will see surgery after biopsy to discuss the results.

## 2017-02-06 NOTE — Telephone Encounter (Signed)
Gave patient AVS and calender per 02/06/2017 los.  

## 2017-02-06 NOTE — Progress Notes (Signed)
Patient Care Team: Marton Redwood, MD as PCP - General (Internal Medicine)  DIAGNOSIS:  Encounter Diagnosis  Name Primary?  . Malignant neoplasm of lower-inner quadrant of right breast of female, estrogen receptor positive ([place])     SUMMARY OF ONCOLOGIC HISTORY:   Malignant neoplasm of lower-inner quadrant of right female breast ([place])   02/03/2014 Breast MRI    7 X 6X 5 mm right breast abnormality as 2 immediately adjacent 3 mm foci of enhancement      05/19/2014 Initial Diagnosis    Malignant neoplasm of lower-inner quadrant of female breast DCIS      06/13/2014 Surgery    R Lumpectomy DCIS 2 mm size Grade 1 Er 70%, PR 80%, Margins Neg      07/25/2014 - 08/25/2014 Radiation Therapy    Adjuvant radiation therapy      10/18/2014 Procedure    genetic testing was normal no mutations identified       CHIEF COMPLIANT: Follow-up after recent breast MRI should revealing abnormalities  INTERVAL HISTORY: Linda [name] is a 71 year old with above-mentioned history of right breast DCIS who underwent lumpectomy and radiation and did not want to take antiestrogen therapy. She had a recent mammogram that revealed an abnormality in the left breast. This was followed up by a breast MRI that revealed a 3.6 area of mass and an non-mass enhancement extending to up to 6.5 cm. She is scheduled to undergo biopsy on Monday. She is here to discuss the treatment plan.  REVIEW OF SYSTEMS:   Constitutional: Denies fevers, chills or abnormal weight loss Eyes: Denies blurriness of vision Ears, nose, mouth, throat, and face: Denies mucositis or sore throat Respiratory: Denies cough, dyspnea or wheezes Cardiovascular: Denies palpitation, chest discomfort Gastrointestinal:  Denies nausea, heartburn or change in bowel habits Skin: Denies abnormal skin rashes Lymphatics: Denies new lymphadenopathy or easy bruising Neurological:Denies numbness, tingling or new weaknesses Behavioral/Psych: Mood is stable, no  new changes  Extremities: No lower extremity edema Breast:  denies any pain or lumps or nodules in either breasts All other systems were reviewed with the patient and are negative.  I have reviewed the past medical history, past surgical history, social history and family history with the patient and they are unchanged from previous note.  ALLERGIES:  has No Known Allergies.  MEDICATIONS:  Current Outpatient Prescriptions  Medication Sig Dispense Refill  . aspirin 81 MG tablet Take 81 mg by mouth daily.    Marland Kitchen CALCIUM-VITAMIN D PO Chew 1 tablet by mouth twice a day    . citalopram (CELEXA) 20 MG tablet Take 1 tablet (20 mg total) by mouth daily.    Marland Kitchen doxycycline (VIBRA-TABS) 100 MG tablet Take 0.5 tablets (50 mg total) by mouth 2 (two) times daily.    . fluocinonide (LIDEX) 0.05 % external solution Use 1 application on the scalp 1-2 times a week  1  . fluticasone (FLONASE) 50 MCG/ACT nasal spray Place 2 sprays into both nostrils daily.     . hydroxychloroquine (PLAQUENIL) 200 MG tablet Take 1 tablet (200 mg total) by mouth 2 (two) times daily.    Marland Kitchen ibandronate (BONIVA) 150 MG tablet TAKE 1 TABLET BY MOUTH EVERY 30 DAYS IN THE MORNING WITH WATER ON EMPTY STOMACH UPRIGHT AND NOTHING IN MOUTH FOR 30 MINUTES. 3 tablet 3  . loratadine (CLARITIN) 10 MG tablet Take 10 mg by mouth daily as needed for allergies.    . Multiple Vitamin (MULTIVITAMIN) capsule Take 1 capsule by mouth daily.      Marland Kitchen  simvastatin (ZOCOR) 20 MG tablet Take 20 mg by mouth every evening.    . zolpidem (AMBIEN) 5 MG tablet TAKE ONE-HALF TABLET BY MOUTH EVERY NIGHT AT BEDTIME AS NEEDED FOR SLEEP     No current facility-administered medications for this visit.     PHYSICAL EXAMINATION: ECOG PERFORMANCE STATUS: 0 - Asymptomatic  Vitals:   02/06/17 1124  BP: 115/63  Pulse: 69  Resp: 18  Temp: 98 F (36.7 C)   Filed Weights   02/06/17 1124  Weight: 155 lb 12.8 oz (70.7 kg)    GENERAL:alert, no distress and  comfortable SKIN: skin color, texture, turgor are normal, no rashes or significant lesions EYES: normal, Conjunctiva are pink and non-injected, sclera clear OROPHARYNX:no exudate, no erythema and lips, buccal mucosa, and tongue normal  NECK: supple, thyroid normal size, non-tender, without nodularity LYMPH:  no palpable lymphadenopathy in the cervical, axillary or inguinal LUNGS: clear to auscultation and percussion with normal breathing effort HEART: regular rate & rhythm and no murmurs and no lower extremity edema ABDOMEN:abdomen soft, non-tender and normal bowel sounds MUSCULOSKELETAL:no cyanosis of digits and no clubbing  NEURO: alert & oriented x 3 with fluent speech, no focal motor/sensory deficits EXTREMITIES: No lower extremity edema BREAST: No palpable masses or nodules in either right or left breasts. No palpable axillary supraclavicular or infraclavicular adenopathy no breast tenderness or nipple discharge. (exam performed in the presence of a chaperone)  LABORATORY DATA:  I have reviewed the data as listed   Chemistry      Component Value Date/Time   NA 136 (L) 09/10/2014 2157   K 4.0 09/10/2014 2157   CL 99 09/10/2014 2157   CO2 23 09/10/2014 2157   BUN 17 09/10/2014 2157   CREATININE 0.92 09/10/2014 2157      Component Value Date/Time   CALCIUM 9.0 09/10/2014 2157       Lab Results  Component Value Date   WBC 5.8 09/10/2014   HGB 13.8 09/10/2014   HCT 40.1 09/10/2014   MCV 99.0 09/10/2014   PLT 266 09/10/2014   NEUTROABS 4.1 06/08/2014    ASSESSMENT & PLAN:  Malignant neoplasm of lower-inner quadrant of right female breast Right breast DCIS ER/PR positive diagnosed March 2015 7 mm focus on MRI and alert lumpectomy 06/13/2014 2 mm focus of DCIS negative margins, ER 70%, PR 80% positive status post radiation therapy; patient was offered antiestrogen therapy but she refused and is currently on surveillance  Breast cancer surveillance: Because her risk of  breast cancer is greater than 20% we decided to do surveillance with 1. Annual mammograms: 02/04/2017: Suspicious left breast distortion 2. Annual MRIs 02/05/2017: Left breast spiculated masslike enhancement 3.6 cm with non-mass enhancement extending from the nipple to the posterior breast measuring 3.2 x 6.4 cm the lateral upper and lateral lower left breast, no abnormal lymph nodes  Patient had slight abnormality in the mammogram in 2017 in the left breast but this did not show up on the breast MRI. So no biopsy was performed at that time.  We will await the results of biopsy to determine further treatment options. If she does have recurrent DCIS then she will undergo lumpectomy and might consider taking adjuvant antiestrogen therapy with radiation.  Osteopenia: Bone density done October 2015 showed a T score of -1.8. Continue with calcium and vitamin D and Boniva monthly.   Patient will see surgery after biopsy to discuss the results. I will see the patient back depending upon the results of the  biopsy.  I spent 25 minutes talking to the patient of which more than half was spent in counseling and coordination of care.  No orders of the defined types were placed in this encounter.  The patient has a good understanding of the overall plan. she agrees with it. she will call with any problems that may develop before the next visit here.   Rulon Eisenmenger, MD 02/06/17

## 2017-02-09 ENCOUNTER — Ambulatory Visit
Admission: RE | Admit: 2017-02-09 | Discharge: 2017-02-09 | Disposition: A | Discharge status: Home / Self Care | Payer: Commercial Managed Care - PPO | Source: Ambulatory Visit | Attending: Hematology and Oncology | Admitting: Hematology and Oncology

## 2017-02-09 ENCOUNTER — Other Ambulatory Visit: Payer: Self-pay | Admitting: Hematology and Oncology

## 2017-02-09 DIAGNOSIS — N6489 Other specified disorders of breast: Secondary | ICD-10-CM

## 2017-02-10 ENCOUNTER — Other Ambulatory Visit: Payer: Self-pay | Admitting: Hematology and Oncology

## 2017-02-10 DIAGNOSIS — Z170 Estrogen receptor positive status [ER+]: Secondary | ICD-10-CM

## 2017-02-10 DIAGNOSIS — C50311 Malignant neoplasm of lower-inner quadrant of right female breast: Secondary | ICD-10-CM

## 2017-02-16 ENCOUNTER — Other Ambulatory Visit: Payer: Self-pay | Admitting: Hematology and Oncology

## 2017-02-16 DIAGNOSIS — C50311 Malignant neoplasm of lower-inner quadrant of right female breast: Secondary | ICD-10-CM

## 2017-02-16 DIAGNOSIS — Z170 Estrogen receptor positive status [ER+]: Secondary | ICD-10-CM

## 2017-02-23 ENCOUNTER — Other Ambulatory Visit: Payer: Self-pay | Admitting: Hematology and Oncology

## 2017-02-23 ENCOUNTER — Ambulatory Visit
Admission: RE | Admit: 2017-02-23 | Discharge: 2017-02-23 | Disposition: A | Discharge status: Home / Self Care | Payer: Commercial Managed Care - PPO | Source: Ambulatory Visit | Attending: Hematology and Oncology | Admitting: Hematology and Oncology

## 2017-02-23 DIAGNOSIS — C50311 Malignant neoplasm of lower-inner quadrant of right female breast: Secondary | ICD-10-CM

## 2017-02-23 DIAGNOSIS — Z170 Estrogen receptor positive status [ER+]: Secondary | ICD-10-CM

## 2017-02-23 MED ORDER — GADOBENATE DIMEGLUMINE 529 MG/ML IV SOLN: 14.0000 mL | Freq: Once | INTRAVENOUS | Status: DC | PRN

## 2017-03-03 ENCOUNTER — Encounter: Payer: Self-pay | Admitting: Hematology and Oncology

## 2017-03-10 ENCOUNTER — Ambulatory Visit
Admission: RE | Admit: 2017-03-10 | Discharge: 2017-03-10 | Disposition: A | Discharge status: Home / Self Care | Payer: PPO | Source: Ambulatory Visit | Attending: Internal Medicine | Admitting: Internal Medicine

## 2017-03-10 DIAGNOSIS — M8589 Other specified disorders of bone density and structure, multiple sites: Secondary | ICD-10-CM | POA: Diagnosis not present

## 2017-03-10 DIAGNOSIS — M8580 Other specified disorders of bone density and structure, unspecified site: Secondary | ICD-10-CM

## 2017-03-10 DIAGNOSIS — Z780 Asymptomatic menopausal state: Secondary | ICD-10-CM | POA: Diagnosis not present

## 2017-03-13 DIAGNOSIS — D0510 Intraductal carcinoma in situ of unspecified breast: Secondary | ICD-10-CM | POA: Diagnosis not present

## 2017-03-13 DIAGNOSIS — N6489 Other specified disorders of breast: Secondary | ICD-10-CM | POA: Diagnosis not present

## 2017-04-07 DIAGNOSIS — N6489 Other specified disorders of breast: Secondary | ICD-10-CM | POA: Diagnosis not present

## 2017-04-21 ENCOUNTER — Other Ambulatory Visit: Payer: Self-pay | Admitting: General Surgery

## 2017-04-21 DIAGNOSIS — N6320 Unspecified lump in the left breast, unspecified quadrant: Secondary | ICD-10-CM

## 2017-04-21 DIAGNOSIS — N6489 Other specified disorders of breast: Secondary | ICD-10-CM | POA: Diagnosis not present

## 2017-04-29 ENCOUNTER — Other Ambulatory Visit: Payer: Self-pay | Admitting: General Surgery

## 2017-04-29 DIAGNOSIS — N6320 Unspecified lump in the left breast, unspecified quadrant: Secondary | ICD-10-CM

## 2017-04-30 ENCOUNTER — Encounter (HOSPITAL_BASED_OUTPATIENT_CLINIC_OR_DEPARTMENT_OTHER): Payer: Self-pay | Admitting: *Deleted

## 2017-04-30 DIAGNOSIS — L03019 Cellulitis of unspecified finger: Secondary | ICD-10-CM | POA: Diagnosis not present

## 2017-04-30 DIAGNOSIS — N63 Unspecified lump in unspecified breast: Secondary | ICD-10-CM | POA: Diagnosis not present

## 2017-04-30 DIAGNOSIS — Z6824 Body mass index (BMI) 24.0-24.9, adult: Secondary | ICD-10-CM | POA: Diagnosis not present

## 2017-05-05 ENCOUNTER — Ambulatory Visit
Admission: RE | Admit: 2017-05-05 | Discharge: 2017-05-05 | Disposition: A | Discharge status: Home / Self Care | Payer: PPO | Source: Ambulatory Visit | Attending: General Surgery | Admitting: General Surgery

## 2017-05-05 DIAGNOSIS — R928 Other abnormal and inconclusive findings on diagnostic imaging of breast: Secondary | ICD-10-CM | POA: Diagnosis not present

## 2017-05-05 DIAGNOSIS — N6320 Unspecified lump in the left breast, unspecified quadrant: Secondary | ICD-10-CM

## 2017-05-05 NOTE — Progress Notes (Signed)
Pt in to pick up boost breeze, instructions reviewed. 

## 2017-05-07 ENCOUNTER — Encounter (HOSPITAL_BASED_OUTPATIENT_CLINIC_OR_DEPARTMENT_OTHER): Payer: Self-pay | Admitting: Anesthesiology

## 2017-05-07 ENCOUNTER — Ambulatory Visit
Admission: RE | Admit: 2017-05-07 | Discharge: 2017-05-07 | Disposition: A | Discharge status: Home / Self Care | Payer: PPO | Source: Ambulatory Visit | Attending: General Surgery | Admitting: General Surgery

## 2017-05-07 ENCOUNTER — Ambulatory Visit (HOSPITAL_BASED_OUTPATIENT_CLINIC_OR_DEPARTMENT_OTHER)
Admission: RE | Admit: 2017-05-07 | Discharge: 2017-05-07 | Disposition: A | Discharge status: Home / Self Care | Payer: PPO | Source: Ambulatory Visit | Attending: General Surgery | Admitting: General Surgery

## 2017-05-07 ENCOUNTER — Ambulatory Visit (HOSPITAL_BASED_OUTPATIENT_CLINIC_OR_DEPARTMENT_OTHER): Payer: PPO | Admitting: Anesthesiology

## 2017-05-07 ENCOUNTER — Encounter (HOSPITAL_BASED_OUTPATIENT_CLINIC_OR_DEPARTMENT_OTHER)
Admission: RE | Disposition: A | Discharge status: Home / Self Care | Payer: Self-pay | Source: Ambulatory Visit | Attending: General Surgery

## 2017-05-07 DIAGNOSIS — Z803 Family history of malignant neoplasm of breast: Secondary | ICD-10-CM | POA: Diagnosis not present

## 2017-05-07 DIAGNOSIS — E785 Hyperlipidemia, unspecified: Secondary | ICD-10-CM | POA: Diagnosis not present

## 2017-05-07 DIAGNOSIS — Z923 Personal history of irradiation: Secondary | ICD-10-CM | POA: Insufficient documentation

## 2017-05-07 DIAGNOSIS — N6489 Other specified disorders of breast: Secondary | ICD-10-CM | POA: Diagnosis not present

## 2017-05-07 DIAGNOSIS — F329 Major depressive disorder, single episode, unspecified: Secondary | ICD-10-CM | POA: Diagnosis not present

## 2017-05-07 DIAGNOSIS — Z87891 Personal history of nicotine dependence: Secondary | ICD-10-CM | POA: Diagnosis not present

## 2017-05-07 DIAGNOSIS — F419 Anxiety disorder, unspecified: Secondary | ICD-10-CM | POA: Diagnosis not present

## 2017-05-07 DIAGNOSIS — N6012 Diffuse cystic mastopathy of left breast: Secondary | ICD-10-CM | POA: Diagnosis not present

## 2017-05-07 DIAGNOSIS — N879 Dysplasia of cervix uteri, unspecified: Secondary | ICD-10-CM | POA: Diagnosis not present

## 2017-05-07 DIAGNOSIS — N6320 Unspecified lump in the left breast, unspecified quadrant: Secondary | ICD-10-CM

## 2017-05-07 DIAGNOSIS — N6092 Unspecified benign mammary dysplasia of left breast: Secondary | ICD-10-CM | POA: Insufficient documentation

## 2017-05-07 DIAGNOSIS — M1990 Unspecified osteoarthritis, unspecified site: Secondary | ICD-10-CM | POA: Diagnosis not present

## 2017-05-07 DIAGNOSIS — N6082 Other benign mammary dysplasias of left breast: Secondary | ICD-10-CM | POA: Diagnosis not present

## 2017-05-07 DIAGNOSIS — R921 Mammographic calcification found on diagnostic imaging of breast: Secondary | ICD-10-CM | POA: Diagnosis not present

## 2017-05-07 DIAGNOSIS — R928 Other abnormal and inconclusive findings on diagnostic imaging of breast: Secondary | ICD-10-CM | POA: Diagnosis not present

## 2017-05-07 HISTORY — DX: Depression, unspecified: F32.A

## 2017-05-07 HISTORY — PX: RADIOACTIVE SEED GUIDED EXCISIONAL BREAST BIOPSY: SHX6490

## 2017-05-07 HISTORY — DX: Major depressive disorder, single episode, unspecified: F32.9

## 2017-05-07 SURGERY — RADIOACTIVE SEED GUIDED BREAST BIOPSY
Anesthesia: General | Site: Breast | Laterality: Left

## 2017-05-07 MED ORDER — MIDAZOLAM HCL 5 MG/5ML IJ SOLN
INTRAMUSCULAR | Status: DC | PRN
  Administered 2017-05-07: 2 mg via INTRAVENOUS

## 2017-05-07 MED ORDER — HYDROCODONE-ACETAMINOPHEN 5-325 MG PO TABS: 1.0000 | ORAL_TABLET | ORAL | 0 refills | Status: DC | PRN

## 2017-05-07 MED ORDER — HYDROMORPHONE HCL 1 MG/ML IJ SOLN
INTRAMUSCULAR | Status: AC
  Filled 2017-05-07: qty 0.5

## 2017-05-07 MED ORDER — BUPIVACAINE HCL (PF) 0.25 % IJ SOLN
INTRAMUSCULAR | Status: AC
  Filled 2017-05-07: qty 30

## 2017-05-07 MED ORDER — PROPOFOL 10 MG/ML IV BOLUS
INTRAVENOUS | Status: AC
  Filled 2017-05-07: qty 20

## 2017-05-07 MED ORDER — SCOPOLAMINE 1 MG/3DAYS TD PT72: 1.0000 | MEDICATED_PATCH | Freq: Once | TRANSDERMAL | Status: DC | PRN

## 2017-05-07 MED ORDER — LIDOCAINE HCL (CARDIAC) 20 MG/ML IV SOLN
INTRAVENOUS | Status: DC | PRN
  Administered 2017-05-07: 30 mg via INTRAVENOUS

## 2017-05-07 MED ORDER — CHLORHEXIDINE GLUCONATE CLOTH 2 % EX PADS: 6.0000 | MEDICATED_PAD | Freq: Once | CUTANEOUS | Status: DC

## 2017-05-07 MED ORDER — OXYCODONE HCL 5 MG/5ML PO SOLN: 5.0000 mg | Freq: Once | ORAL | Status: AC | PRN

## 2017-05-07 MED ORDER — ACETAMINOPHEN 500 MG PO TABS
ORAL_TABLET | ORAL | Status: AC
  Filled 2017-05-07: qty 2

## 2017-05-07 MED ORDER — HYDROMORPHONE HCL 1 MG/ML IJ SOLN
0.2500 mg | INTRAMUSCULAR | Status: DC | PRN
  Administered 2017-05-07: 0.25 mg via INTRAVENOUS

## 2017-05-07 MED ORDER — CEFAZOLIN SODIUM-DEXTROSE 2-4 GM/100ML-% IV SOLN
INTRAVENOUS | Status: AC
  Filled 2017-05-07: qty 100

## 2017-05-07 MED ORDER — FENTANYL CITRATE (PF) 100 MCG/2ML IJ SOLN
INTRAMUSCULAR | Status: DC | PRN
  Administered 2017-05-07: 100 ug via INTRAVENOUS

## 2017-05-07 MED ORDER — EPHEDRINE SULFATE 50 MG/ML IJ SOLN
INTRAMUSCULAR | Status: DC | PRN
  Administered 2017-05-07 (×2): 10 mg via INTRAVENOUS
  Administered 2017-05-07: 5 mg via INTRAVENOUS

## 2017-05-07 MED ORDER — MIDAZOLAM HCL 2 MG/2ML IJ SOLN
INTRAMUSCULAR | Status: AC
  Filled 2017-05-07: qty 2

## 2017-05-07 MED ORDER — PROMETHAZINE HCL 25 MG/ML IJ SOLN: 6.2500 mg | INTRAMUSCULAR | Status: DC | PRN

## 2017-05-07 MED ORDER — OXYCODONE HCL 5 MG PO TABS
ORAL_TABLET | ORAL | Status: AC
  Filled 2017-05-07: qty 1

## 2017-05-07 MED ORDER — LACTATED RINGERS IV SOLN
INTRAVENOUS | Status: DC
  Administered 2017-05-07 (×2): via INTRAVENOUS

## 2017-05-07 MED ORDER — GABAPENTIN 300 MG PO CAPS
300.0000 mg | ORAL_CAPSULE | ORAL | Status: AC
  Administered 2017-05-07: 300 mg via ORAL

## 2017-05-07 MED ORDER — DEXAMETHASONE SODIUM PHOSPHATE 4 MG/ML IJ SOLN
INTRAMUSCULAR | Status: DC | PRN
  Administered 2017-05-07: 10 mg via INTRAVENOUS

## 2017-05-07 MED ORDER — MIDAZOLAM HCL 2 MG/2ML IJ SOLN: 1.0000 mg | INTRAMUSCULAR | Status: DC | PRN

## 2017-05-07 MED ORDER — PROPOFOL 10 MG/ML IV BOLUS
INTRAVENOUS | Status: DC | PRN
  Administered 2017-05-07: 150 mg via INTRAVENOUS

## 2017-05-07 MED ORDER — ACETAMINOPHEN 500 MG PO TABS: 1000.0000 mg | ORAL_TABLET | Freq: Once | ORAL | Status: DC

## 2017-05-07 MED ORDER — FENTANYL CITRATE (PF) 100 MCG/2ML IJ SOLN
INTRAMUSCULAR | Status: AC
  Filled 2017-05-07: qty 2

## 2017-05-07 MED ORDER — GABAPENTIN 300 MG PO CAPS
ORAL_CAPSULE | ORAL | Status: AC
  Filled 2017-05-07: qty 1

## 2017-05-07 MED ORDER — CEFAZOLIN SODIUM-DEXTROSE 2-4 GM/100ML-% IV SOLN
2.0000 g | INTRAVENOUS | Status: AC
  Administered 2017-05-07: 2 g via INTRAVENOUS

## 2017-05-07 MED ORDER — BUPIVACAINE HCL (PF) 0.25 % IJ SOLN
INTRAMUSCULAR | Status: DC | PRN
  Administered 2017-05-07: 10 mL

## 2017-05-07 MED ORDER — ACETAMINOPHEN 500 MG PO TABS
1000.0000 mg | ORAL_TABLET | ORAL | Status: AC
  Administered 2017-05-07: 1000 mg via ORAL

## 2017-05-07 MED ORDER — OXYCODONE HCL 5 MG PO TABS
5.0000 mg | ORAL_TABLET | Freq: Once | ORAL | Status: AC | PRN
  Administered 2017-05-07: 5 mg via ORAL

## 2017-05-07 MED ORDER — EPHEDRINE 5 MG/ML INJ
INTRAVENOUS | Status: AC
  Filled 2017-05-07: qty 10

## 2017-05-07 MED ORDER — FENTANYL CITRATE (PF) 100 MCG/2ML IJ SOLN: 50.0000 ug | INTRAMUSCULAR | Status: DC | PRN

## 2017-05-07 SURGICAL SUPPLY — 63 items
ADH SKN CLS APL DERMABOND .7 (GAUZE/BANDAGES/DRESSINGS) ×1
APPLIER CLIP 9.375 MED OPEN (MISCELLANEOUS) ×2
APR CLP MED 9.3 20 MLT OPN (MISCELLANEOUS) ×1
BINDER BREAST LRG (GAUZE/BANDAGES/DRESSINGS) ×1 IMPLANT
BINDER BREAST MEDIUM (GAUZE/BANDAGES/DRESSINGS) IMPLANT
BINDER BREAST XLRG (GAUZE/BANDAGES/DRESSINGS) IMPLANT
BINDER BREAST XXLRG (GAUZE/BANDAGES/DRESSINGS) IMPLANT
BLADE SURG 15 STRL LF DISP TIS (BLADE) ×1 IMPLANT
BLADE SURG 15 STRL SS (BLADE) ×2
CANISTER SUC SOCK COL 7IN (MISCELLANEOUS) IMPLANT
CANISTER SUCT 1200ML W/VALVE (MISCELLANEOUS) IMPLANT
CHLORAPREP W/TINT 26ML (MISCELLANEOUS) ×2 IMPLANT
CLIP APPLIE 9.375 MED OPEN (MISCELLANEOUS) IMPLANT
CLIP TI WIDE RED SMALL 6 (CLIP) IMPLANT
COVER BACK TABLE 60X90IN (DRAPES) ×2 IMPLANT
COVER MAYO STAND STRL (DRAPES) ×2 IMPLANT
COVER PROBE W GEL 5X96 (DRAPES) ×2 IMPLANT
DECANTER SPIKE VIAL GLASS SM (MISCELLANEOUS) IMPLANT
DERMABOND ADVANCED (GAUZE/BANDAGES/DRESSINGS) ×1
DERMABOND ADVANCED .7 DNX12 (GAUZE/BANDAGES/DRESSINGS) ×1 IMPLANT
DEVICE DUBIN W/COMP PLATE 8390 (MISCELLANEOUS) ×2 IMPLANT
DRAPE LAPAROSCOPIC ABDOMINAL (DRAPES) ×2 IMPLANT
DRAPE UTILITY XL STRL (DRAPES) ×2 IMPLANT
DRSG TEGADERM 4X4.75 (GAUZE/BANDAGES/DRESSINGS) IMPLANT
ELECT COATED BLADE 2.86 ST (ELECTRODE) ×2 IMPLANT
ELECT REM PT RETURN 9FT ADLT (ELECTROSURGICAL) ×2
ELECTRODE REM PT RTRN 9FT ADLT (ELECTROSURGICAL) ×1 IMPLANT
GAUZE SPONGE 4X4 12PLY STRL LF (GAUZE/BANDAGES/DRESSINGS) IMPLANT
GLOVE BIO SURGEON STRL SZ7 (GLOVE) ×4 IMPLANT
GLOVE BIOGEL PI IND STRL 6.5 (GLOVE) IMPLANT
GLOVE BIOGEL PI IND STRL 7.0 (GLOVE) IMPLANT
GLOVE BIOGEL PI IND STRL 7.5 (GLOVE) ×1 IMPLANT
GLOVE BIOGEL PI INDICATOR 6.5 (GLOVE) ×1
GLOVE BIOGEL PI INDICATOR 7.0 (GLOVE) ×1
GLOVE BIOGEL PI INDICATOR 7.5 (GLOVE) ×1
GLOVE ECLIPSE 6.5 STRL STRAW (GLOVE) ×1 IMPLANT
GOWN STRL REUS W/ TWL LRG LVL3 (GOWN DISPOSABLE) ×2 IMPLANT
GOWN STRL REUS W/TWL LRG LVL3 (GOWN DISPOSABLE) ×4
HEMOSTAT ARISTA ABSORB 3G PWDR (MISCELLANEOUS) ×1 IMPLANT
ILLUMINATOR WAVEGUIDE N/F (MISCELLANEOUS) IMPLANT
KIT MARKER MARGIN INK (KITS) ×2 IMPLANT
LIGHT WAVEGUIDE WIDE FLAT (MISCELLANEOUS) IMPLANT
NDL HYPO 25X1 1.5 SAFETY (NEEDLE) ×1 IMPLANT
NEEDLE HYPO 25X1 1.5 SAFETY (NEEDLE) ×2 IMPLANT
NS IRRIG 1000ML POUR BTL (IV SOLUTION) IMPLANT
PACK BASIN DAY SURGERY FS (CUSTOM PROCEDURE TRAY) ×2 IMPLANT
PENCIL BUTTON HOLSTER BLD 10FT (ELECTRODE) ×2 IMPLANT
SLEEVE SCD COMPRESS KNEE MED (MISCELLANEOUS) ×2 IMPLANT
SPONGE LAP 4X18 X RAY DECT (DISPOSABLE) ×2 IMPLANT
STRIP CLOSURE SKIN 1/2X4 (GAUZE/BANDAGES/DRESSINGS) ×2 IMPLANT
SUT MNCRL AB 4-0 PS2 18 (SUTURE) IMPLANT
SUT MON AB 5-0 PS2 18 (SUTURE) ×1 IMPLANT
SUT SILK 2 0 SH (SUTURE) IMPLANT
SUT VIC AB 2-0 SH 27 (SUTURE) ×2
SUT VIC AB 2-0 SH 27XBRD (SUTURE) ×1 IMPLANT
SUT VIC AB 3-0 SH 27 (SUTURE) ×2
SUT VIC AB 3-0 SH 27X BRD (SUTURE) ×1 IMPLANT
SUT VIC AB 5-0 PS2 18 (SUTURE) IMPLANT
SYR CONTROL 10ML LL (SYRINGE) ×2 IMPLANT
TOWEL OR 17X24 6PK STRL BLUE (TOWEL DISPOSABLE) ×2 IMPLANT
TOWEL OR NON WOVEN STRL DISP B (DISPOSABLE) ×2 IMPLANT
TUBE CONNECTING 20X1/4 (TUBING) ×1 IMPLANT
YANKAUER SUCT BULB TIP NO VENT (SUCTIONS) ×1 IMPLANT

## 2017-05-07 NOTE — Anesthesia Postprocedure Evaluation (Signed)
Anesthesia Post Note  Patient: Linda [name]  Procedure(s) Performed: Procedure(s) (LRB): LEFT RADIOACTIVE SEED GUIDED EXCISIONAL BREAST BIOPSY (Left)     Patient location during evaluation: PACU Anesthesia Type: General Level of consciousness: awake and alert Pain management: pain level controlled Vital Signs Assessment: post-procedure vital signs reviewed and stable Respiratory status: spontaneous breathing, nonlabored ventilation, respiratory function stable and patient connected to nasal cannula oxygen Cardiovascular status: blood pressure returned to baseline and stable Postop Assessment: no signs of nausea or vomiting Anesthetic complications: no    Last Vitals:  Vitals:   05/07/17 1515 05/07/17 1525  BP: 119/68   Pulse: 82 82  Resp: 16 15  Temp:      Last Pain:  Vitals:   05/07/17 1509  TempSrc:   PainSc: 4                  Diron Haddon P Eloyce Bultman

## 2017-05-07 NOTE — Interval H&P Note (Signed)
History and Physical Interval Note:  05/07/2017 1:32 PM  Linda [name]  has presented today for surgery, with the diagnosis of left breast mass  The various methods of treatment have been discussed with the patient and family. After consideration of risks, benefits and other options for treatment, the patient has consented to  Procedure(s): LEFT RADIOACTIVE SEED GUIDED EXCISIONAL BREAST BIOPSY (Left) as a surgical intervention .  The patient's history has been reviewed, patient examined, no change in status, stable for surgery.  I have reviewed the patient's chart and labs.  Questions were answered to the patient's satisfaction.     Doy Taaffe

## 2017-05-07 NOTE — Transfer of Care (Signed)
Immediate Anesthesia Transfer of Care Note  Patient: Linda [name]  Procedure(s) Performed: Procedure(s): LEFT RADIOACTIVE SEED GUIDED EXCISIONAL BREAST BIOPSY (Left)  Patient Location: PACU  Anesthesia Type:General  Level of Consciousness: awake and patient cooperative  Airway & Oxygen Therapy: Patient Spontanous Breathing and Patient connected to face mask oxygen  Post-op Assessment: Report given to RN and Post -op Vital signs reviewed and stable  Post vital signs: Reviewed and stable  Last Vitals:  Vitals:   05/07/17 1314  BP: 104/74  Pulse: 78  Resp: 18  Temp: 36.9 C    Last Pain:  Vitals:   05/07/17 1314  TempSrc: Oral         Complications: No apparent anesthesia complications

## 2017-05-07 NOTE — Discharge Instructions (Signed)
[facility] °Office Phone Number [phone] ° °POST OP INSTRUCTIONS ° °Always review your discharge instruction sheet given to you by the facility where your surgery was performed. ° °IF YOU HAVE DISABILITY OR FAMILY LEAVE FORMS, YOU MUST BRING THEM TO THE OFFICE FOR PROCESSING.  DO NOT GIVE THEM TO YOUR DOCTOR. ° °1. A prescription for pain medication may be given to you upon discharge.  Take your pain medication as prescribed, if needed.  If narcotic pain medicine is not needed, then you may take acetaminophen (Tylenol), naprosyn (Alleve) or ibuprofen (Advil) as needed. °2. Take your usually prescribed medications unless otherwise directed °3. If you need a refill on your pain medication, please contact your pharmacy.  They will contact our office to request authorization.  Prescriptions will not be filled after 5pm or on week-ends. °4. You should eat very light the first 24 hours after surgery, such as soup, crackers, pudding, etc.  Resume your normal diet the day after surgery. °5. Most patients will experience some swelling and bruising in the breast.  Ice packs and a good support bra will help.  Wear the breast binder provided or a sports bra for 72 hours day and night.  After that wear a sports bra during the day until you return to the office. Swelling and bruising can take several days to resolve.  °6. It is common to experience some constipation if taking pain medication after surgery.  Increasing fluid intake and taking a stool softener will usually help or prevent this problem from occurring.  A mild laxative (Milk of Magnesia or Miralax) should be taken according to package directions if there are no bowel movements after 48 hours. °7. Unless discharge instructions indicate otherwise, you may remove your bandages 48 hours after surgery and you may shower at that time.  You may have steri-strips (small skin tapes) in place directly over the incision.  These strips should be left on the  skin for 7-10 days and will come off on their own.  If your surgeon used skin glue on the incision, you may shower in 24 hours.  The glue will flake off over the next 2-3 weeks.  Any sutures or staples will be removed at the office during your follow-up visit. °8. ACTIVITIES:  You may resume regular daily activities (gradually increasing) beginning the next day.  Wearing a good support bra or sports bra minimizes pain and swelling.  You may have sexual intercourse when it is comfortable. °a. You may drive when you no longer are taking prescription pain medication, you can comfortably wear a seatbelt, and you can safely maneuver your car and apply brakes. °b. RETURN TO WORK:  ______________________________________________________________________________________ °9. You should see your doctor in the office for a follow-up appointment approximately two weeks after your surgery.  Your doctor’s nurse will typically make your follow-up appointment when she calls you with your pathology report.  Expect your pathology report 3-4 business days after your surgery.  You may call to check if you do not hear from us after three days. °10. OTHER INSTRUCTIONS: _______________________________________________________________________________________________ _____________________________________________________________________________________________________________________________________ °_____________________________________________________________________________________________________________________________________ °_____________________________________________________________________________________________________________________________________ ° °WHEN TO CALL DR WAKEFIELD: °1. Fever over 101.0 °2. Nausea and/or vomiting. °3. Extreme swelling or bruising. °4. Continued bleeding from incision. °5. Increased pain, redness, or drainage from the incision. ° °The clinic staff is available to answer your questions during regular  business hours.  Please don’t hesitate to call and ask to speak to one of the nurses for clinical concerns.  If   you have a medical emergency, go to the nearest emergency room or call 911.  A surgeon from [facility] is always on call at the hospital. ° °For further questions, please visit [email or web address] ° ° ° ° ° °Post Anesthesia Home Care Instructions ° °Activity: °Get plenty of rest for the remainder of the day. A responsible individual must stay with you for 24 hours following the procedure.  °For the next 24 hours, DO NOT: °-Drive a car °-Operate machinery °-Drink alcoholic beverages °-Take any medication unless instructed by your physician °-Make any legal decisions or sign important papers. ° °Meals: °Start with liquid foods such as gelatin or soup. Progress to regular foods as tolerated. Avoid greasy, spicy, heavy foods. If nausea and/or vomiting occur, drink only clear liquids until the nausea and/or vomiting subsides. Call your physician if vomiting continues. ° °Special Instructions/Symptoms: °Your throat may feel dry or sore from the anesthesia or the breathing tube placed in your throat during surgery. If this causes discomfort, gargle with warm salt water. The discomfort should disappear within 24 hours. ° °If you had a scopolamine patch placed behind your ear for the management of post- operative nausea and/or vomiting: ° °1. The medication in the patch is effective for 72 hours, after which it should be removed.  Wrap patch in a tissue and discard in the trash. Wash hands thoroughly with soap and water. °2. You may remove the patch earlier than 72 hours if you experience unpleasant side effects which may include dry mouth, dizziness or visual disturbances. °3. Avoid touching the patch. Wash your hands with soap and water after contact with the patch. °  ° °

## 2017-05-07 NOTE — Anesthesia Procedure Notes (Signed)
Procedure Name: LMA Insertion Date/Time: 05/07/2017 1:53 PM Performed by: Toula Moos L Pre-anesthesia Checklist: Patient identified, Emergency Drugs available, Suction available, Patient being monitored and Timeout performed Patient Re-evaluated:Patient Re-evaluated prior to inductionOxygen Delivery Method: Circle system utilized Preoxygenation: Pre-oxygenation with 100% oxygen Intubation Type: IV induction Ventilation: Mask ventilation without difficulty LMA: LMA inserted LMA Size: 4.0 Number of attempts: 1 Airway Equipment and Method: Bite block Placement Confirmation: positive ETCO2 Tube secured with: Tape Dental Injury: Teeth and Oropharynx as per pre-operative assessment

## 2017-05-07 NOTE — Op Note (Signed)
Preoperative diagnoses: left breast mammographic and mri abnormality with core biopsy c/w radial scar/alh Postoperative diagnosis: Same as above Procedure:Leftbreast seed guided excisional biopsy Surgeon: Dr. Serita Grammes Anesthesia: Gen. Estimated blood loss: minimal Complications: None Drains: None Specimens:Leftbreast tissue marked with paint Sponge and needle count correct at completion Disposition to recovery stable  Indications: This is a 63 yof I know from prior right breast cancer. She is undergoing follow up and had mri ordered by oncology. This has a mass present and a larger area of nme.  She had three biopsies all with csls present. I discussed with radiology as to excise this whole area is approaching a mastectomy.  We elected to proceed with excision of the mass found on mri and go from there.    She has seed placed prior to beginning and I had these mammograms in the OR  Procedure: After informed consent was obtained she was then taken to the operating room. She was given cefazolin. Sequential compression devices were on her legs. She was placed under general anesthesia without complication. Her leftbreast was then prepped and draped in the standard sterile surgical fashion. A surgical timeout was then performed.  I located the radioactive seed with the neoprobe. I infiltrated marcaine in the area of the seed.I made a periareolar incision to hide the scar.  I then used the neoprobe to guide the excision of the seed and surrounding tissue.This was confirmed by the neoprobe. This was then taken for mammogram which confirmed removal of the seed and the clip. One of the other clips was removed with this as well.This was confirmed by radiology. This was then sent to pathology. Hemostasis was observed.I closed the breast tissue with a 2-0 Vicryl. The dermis was closed with 3-0 Vicryl and the skin with 5-0 Monocryl.Dermabond and steristrips were placed on the incision. A  breast binder was placed. She was transferred to recovery stable

## 2017-05-07 NOTE — H&P (Signed)
Linda [name] is an 71 y.o. female.   Chief Complaint: left breast radial scar/alh HPI: 62 yof who underwent right breast lumpectomy for er/pr pos dcis in 2015. she underwent adjuvant radiotherapy and had elected not to do antiestrogen therapy due to concerns about medication. she had genetic testing that was normal. she remains very active. no mass or dc noted. she underwent routine screening mm and mri per oncology recently. mm shows c density breasts. the right side shows only postop changes. the left side shows subtle area of distortion. US shows no mass. she also went mri due to lifetime risk of breast cancer that shows nl right side, nl nodes and on the left side shows a 1.3x3.6x1.9 cm mass correlating to mm distortion. There is nme that extends from nipple to posterior breast measuring 3.2x6.4 cm. biopsy of anterior/posterior extent of enhancement as well as mass were all done. the stereo biopsied lesion is a csl with alh. the mri biopsies are csls and one also has alh. hematomas have finally resolved. I have discussed case with radiology and we have decided to proceed with just seed guided excision of the mm distortion/csl/alh.    Past Medical History:  Diagnosis Date  . Anxiety   . Arthritis   . Atrophic vaginitis   . Breast cancer ([place])    right (DCIS) s/p lumpectomy 7/15  . Cataract    BILATERAL  . Cervical dysplasia 1975  . DCIS (ductal carcinoma in situ)    RIGHT  . Depression   . Family history of breast cancer   . Family history of colon cancer   . Family history of prostate cancer   . Hx of radiation therapy 08/02/14- 08/24/14   right reast 4256 cGy i 16 sessions, no boost  . Hypercholesteremia   . Insomnia   . Osteopenia 11/2011   T score -1.7 FRAX not calculated due to history of bisphosphonates and current Evista  . Personal history of radiation therapy 2015   right side  . Seasonal allergies   . Skin cancer 2006   basal cell of face    Past Surgical  History:  Procedure Laterality Date  . APPENDECTOMY  1955  . BREAST BIOPSY  2010   rt bx  . BREAST BIOPSY  2009   rt  . BREAST LUMPECTOMY  1983   BENIGN-rt  . BREAST LUMPECTOMY WITH RADIOACTIVE SEED LOCALIZATION Right 06/13/2014   Procedure: RIGHT BREAST  RADIOACTIVE SEED GUIDED LUMPECTOMY;  Surgeon: Rolm Bookbinder, MD;  Location: [place];  Service: General;  Laterality: Right;for DCIS  . CATARACT EXTRACTION     Both eyes  . cataract surgery  2005  . COLONOSCOPY    . COLPOSCOPY    . EXCISION OF BASAL CELL CA FROM [facility]  2006  . FACELIFT  2014  . FACIAL COSMETIC SURGERY    . GYNECOLOGIC CRYOSURGERY    . KNEE SURGERY  2013   Arthroscopic-rt    Family History  Problem Relation Age of Onset  . Breast cancer Mother        DIAGNOSED IN HER 80'S  . Hyperlipidemia Mother   . Osteoporosis Mother   . Breast cancer Sister        DIAGNOSED AT AGE 50  . Hypertension Maternal Grandmother   . Breast cancer Maternal Grandmother        possibly breast cancer; had mastectomy in her 30s-40s, but don't know if it was due to cancer  . Heart disease Maternal Grandfather   .  Stroke Maternal Grandfather   . Diabetes Son        TYPE 1 DIABETES  . Colon cancer Paternal Grandfather   . Cancer Paternal Grandfather        colon  . Heart failure Son   . Kidney cancer Paternal Grandmother        bladder/kidney cancer  . Prostate cancer Maternal Uncle    Social History:  reports that she quit smoking about 42 years ago. She has never used smokeless tobacco. She reports that she drinks about 3.0 oz of alcohol per week . She reports that she does not use drugs.  Allergies: No Known Allergies  Medications Prior to Admission  Medication Sig Dispense Refill  . ALPRAZolam (XANAX) 0.5 MG tablet Take 0.5 mg by mouth at bedtime as needed for anxiety.    Marland Kitchen CALCIUM-VITAMIN D PO Chew 1 tablet by mouth twice a day    . fluticasone (FLONASE) 50 MCG/ACT nasal spray Place 2 sprays into both  nostrils daily.     Marland Kitchen loratadine (CLARITIN) 10 MG tablet Take 10 mg by mouth daily as needed for allergies.    . Multiple Vitamin (MULTIVITAMIN) capsule Take 1 capsule by mouth daily.      . sertraline (ZOLOFT) 50 MG tablet Take 1 tablet (50 mg total) by mouth daily.    . simvastatin (ZOCOR) 20 MG tablet Take 20 mg by mouth every evening.    . zolpidem (AMBIEN) 5 MG tablet TAKE ONE-HALF TABLET BY MOUTH EVERY NIGHT AT BEDTIME AS NEEDED FOR SLEEP      No results found for this or any previous visit (from the past 48 hour(s)). No results found.  Review of Systems  All other systems reviewed and are negative.   Blood pressure 104/74, pulse 78, temperature 98.4 F (36.9 C), temperature source Oral, resp. rate 18, height 5\' 7"  (1.702 m), weight 69.1 kg (152 lb 6 oz), SpO2 98 %. Physical Exam   Vitals [name] RMA; 04/21/2017 1:49 PM) 04/21/2017 1:48 PM Weight: 154 lb Height: 68in Body Surface Area: 1.83 m Body Mass Index: 23.42 kg/m  Temp.: 97.21F  Pulse: 73 (Regular)  BP: 120/70 (Sitting, Left Arm, Standard Physical Exam Rolm Bookbinder MD; 04/21/2017 2:27 PM) Breast Note: left breast without any hematoma now cv rrr Lungs clear  Assessment/Plan RADIAL SCAR OF BREAST (N64.89) Story: left breast seed guided excision I had long conversation with radiology about plan. this area is too large just to excise whole area and I think unlikely there is pathology. she has already declined antiestrogens for dcis so not sure atypia will change that. we discussed today just observation and options of single vs multiple excisions. I think that best and safest option without disfiguring her would be seed guided excision of the mm distortion that corresponds to the mri mass also. she is agreeable to that and will schedule her. she did this with recent surgery but we did review surgery, recovery and restrictions.  Rolm Bookbinder, MD 05/07/2017, 1:31 PM

## 2017-05-07 NOTE — Anesthesia Preprocedure Evaluation (Addendum)
Anesthesia Evaluation  Patient identified by MRN, date of birth, ID band Patient awake    Reviewed: Allergy & Precautions, H&P , NPO status   Airway Mallampati: III  TM Distance: >3 FB Neck ROM: Full    Dental no notable dental hx.    Pulmonary former smoker,    Pulmonary exam normal breath sounds clear to auscultation       Cardiovascular Exercise Tolerance: Good negative cardio ROS Normal cardiovascular exam Rhythm:Regular Rate:Normal     Neuro/Psych Anxiety    GI/Hepatic negative GI ROS, Neg liver ROS,   Endo/Other  negative endocrine ROS  Renal/GU negative Renal ROS     Musculoskeletal  (+) Arthritis ,   Abdominal   Peds  Hematology   Anesthesia Other Findings Hyperlipidemia  Reproductive/Obstetrics                            Anesthesia Physical  Anesthesia Plan  ASA: I  Anesthesia Plan: General   Post-op Pain Management:    Induction: Intravenous  PONV Risk Score and Plan: 3 and Ondansetron, Dexamethasone, Propofol and Midazolam  Airway Management Planned: LMA  Additional Equipment:   Intra-op Plan:   Post-operative Plan:   Informed Consent: I have reviewed the patients History and Physical, chart, labs and discussed the procedure including the risks, benefits and alternatives for the proposed anesthesia with the patient or authorized representative who has indicated his/her understanding and acceptance.   Dental advisory given  Plan Discussed with: CRNA and Surgeon  Anesthesia Plan Comments:         Anesthesia Quick Evaluation

## 2017-05-08 ENCOUNTER — Encounter (HOSPITAL_BASED_OUTPATIENT_CLINIC_OR_DEPARTMENT_OTHER): Payer: Self-pay | Admitting: General Surgery

## 2017-06-29 NOTE — Progress Notes (Signed)
Cardiology Office Note Date:  06/30/2017  Patient ID:  Linda [name], Linda [name] 05/09/1946, MRN [id number] PCP:  Marton Redwood, MD  Cardiologist:  Dr. Rayann Heman, (2015)    Chief Complaint: over-due visit  History of Present Illness: Linda [name] is a 71 y.o. female with history of palpitations, PVCs, HLD, right breast cancer s/p lumpectomy w/Rad tx.  She comes in today to be seen for Dr. Rayann Heman, last seen byhim with a single visit in 2015 after a [facility] hosp[facility] stay for palpitations that noted PVCs, and echo Jan 2016 noted normal LVEF, no significant VHD, an ETT was negative for ischemic changes.  Since then she had been doing well without much in the way of palpitations.  In the last couple weeks she has become aware of the palpitations again.  They are the same as before though more frequent.  No CP or SOB, no dizziness, near syncope or syncope.  She walks 3 days a week for exercise and her exertional capacity remains good, perhaps feeling like hills make her more winded then usual but able to keep her pace and continue.  This is a slight observation and she has no difficulties with her ADLs.  She drinks a cola every morning and did stop that in the last week without change to the palpitations, reports good water intake.  No new medicines, in-fact her PMD had her stop her statin 2/2 some myalgias that resolved off it and is planned for re-visit to discuss alternative agent.  She has not been ill, no unusual stressors.  She denies any change to her PMHx, or new diagnosis', she had a left breast lumpectomy earlier this year was benign.  Past Medical History:  Diagnosis Date  . Anxiety   . Arthritis   . Atrophic vaginitis   . Breast cancer ([place])    right (DCIS) s/p lumpectomy 7/15  . Cataract    BILATERAL  . Cervical dysplasia 1975  . DCIS (ductal carcinoma in situ)    RIGHT  . Depression   . Family history of breast cancer   . Family history of colon cancer   . Family history of prostate  cancer   . Hx of radiation therapy 08/02/14- 08/24/14   right reast 4256 cGy i 16 sessions, no boost  . Hypercholesteremia   . Insomnia   . Osteopenia 11/2011   T score -1.7 FRAX not calculated due to history of bisphosphonates and current Evista  . Personal history of radiation therapy 2015   right side  . Seasonal allergies   . Skin cancer 2006   basal cell of face    Past Surgical History:  Procedure Laterality Date  . APPENDECTOMY  1955  . BREAST BIOPSY  2010   rt bx  . BREAST BIOPSY  2009   rt  . BREAST LUMPECTOMY  1983   BENIGN-rt  . BREAST LUMPECTOMY WITH RADIOACTIVE SEED LOCALIZATION Right 06/13/2014   Procedure: RIGHT BREAST  RADIOACTIVE SEED GUIDED LUMPECTOMY;  Surgeon: Rolm Bookbinder, MD;  Location: [place];  Service: General;  Laterality: Right;for DCIS  . CATARACT EXTRACTION     Both eyes  . cataract surgery  2005  . COLONOSCOPY    . COLPOSCOPY    . EXCISION OF BASAL CELL CA FROM [facility]  2006  . FACELIFT  2014  . FACIAL COSMETIC SURGERY    . GYNECOLOGIC CRYOSURGERY    . KNEE SURGERY  2013   Arthroscopic-rt  . RADIOACTIVE SEED GUIDED EXCISIONAL BREAST BIOPSY  Left 05/07/2017   Procedure: LEFT RADIOACTIVE SEED GUIDED EXCISIONAL BREAST BIOPSY;  Surgeon: Rolm Bookbinder, MD;  Location: [place];  Service: General;  Laterality: Left;    Current Outpatient Prescriptions  Medication Sig Dispense Refill  . ALPRAZolam (XANAX) 0.5 MG tablet Take 0.5 mg by mouth at bedtime as needed for anxiety.    Marland Kitchen CALCIUM-VITAMIN D PO Chew 1 tablet by mouth twice a day    . fluticasone (FLONASE) 50 MCG/ACT nasal spray Place 2 sprays into both nostrils daily as needed for allergies or rhinitis.     Marland Kitchen loratadine (CLARITIN) 10 MG tablet Take 10 mg by mouth daily as needed for allergies.    . Multiple Vitamin (MULTIVITAMIN) capsule Take 1 capsule by mouth daily.      . sertraline (ZOLOFT) 100 MG tablet Take 100 mg by mouth daily.    Marland Kitchen zolpidem (AMBIEN)  5 MG tablet TAKE ONE-HALF TABLET BY MOUTH EVERY NIGHT AT BEDTIME AS NEEDED FOR SLEEP    . simvastatin (ZOCOR) 20 MG tablet Take 20 mg by mouth every evening.     No current facility-administered medications for this visit.     Allergies:   Other   Social History:  The patient  reports that she quit smoking about 43 years ago. She has never used smokeless tobacco. She reports that she drinks about 3.0 oz of alcohol per week . She reports that she does not use drugs.   Family History:  The patient's family history includes Breast cancer in her maternal grandmother, mother, and sister; Cancer in her paternal grandfather; Colon cancer in her paternal grandfather; Diabetes in her son; Heart disease in her maternal grandfather; Heart failure in her son; Hyperlipidemia in her mother; Hypertension in her maternal grandmother; Kidney cancer in her paternal grandmother; Osteoporosis in her mother; Prostate cancer in her maternal uncle; Stroke in her maternal grandfather.  ROS:  Please see the history of present illness.    All other systems are reviewed and otherwise negative.   PHYSICAL EXAM:  VS:  BP 116/70   Pulse 82   Ht 5\' 7"  (1.702 m)   Wt 151 lb 6.4 oz (68.7 kg)   SpO2 93%   BMI 23.71 kg/m  BMI: Body mass index is 23.71 kg/m. Well nourished, well developed, healthy appearing, in no acute distress  HEENT: normocephalic, atraumatic  Neck: no JVD, carotid bruits or masses Cardiac:  RRR; extrasystole is appreciate no significant murmurs, no rubs, or gallops Lungs:   CTA b/l, no wheezing, rhonchi or rales  Abd: soft, nontender MS: no deformity or atrophy Ext: no edema  Skin: warm and dry, no rash Neuro:  No gross deficits appreciated Psych: euthymic mood, full affect   EKG:  Done today and reviewed by myself shows SR, 82bpm, PR 182ms, QRS 56ms, QTc 430ms, PVCs  12/04/14: TTE Study Conclusions - Left ventricle: The cavity size was normal. Systolic function was normal. The estimated  ejection fraction was in the range of 55% to 60%. Wall motion was normal; there were no regional wall motion abnormalities. - Aortic valve: There was mild regurgitation. - Atrial septum: No defect or patent foramen ovale was identified.  12/12/14: ETT ETT Interpretation:  normal - no evidence of ischemia by ST analysis Comments: Patient presents today for routine GXT. Has had episode of PVCs that necessitated ER visit.  Echo obtained which was basically normal. She has been doing ok without issue since her last visit here.   Today the patient  exercised on the standard Bruce protocol for a total of 9 minutes.  Good exercise tolerance.  Adequate blood pressure response.  Clinically negative for chest pain. Test was stopped due to achievement of target HR.  EKG negative for ischemia. No significant arrhythmia noted. 2 PVCs noted at peak of exercise.   Recent Labs: No results found for requested labs within last 8760 hours.  No results found for requested labs within last 8760 hours.   CrCl cannot be calculated (Patient's most recent lab result is older than the maximum 21 days allowed.).   Wt Readings from Last 3 Encounters:  06/30/17 151 lb 6.4 oz (68.7 kg)  05/07/17 152 lb 6 oz (69.1 kg)  02/06/17 155 lb 12.8 oz (70.7 kg)     Other studies reviewed: Additional studies/records reviewed today include: summarized above  ASSESSMENT AND PLAN:  1. Hx of palpitations, PVCs     Recurrent with incresed frequency      Will have her wear a 48 monitor to get an idea of number of PVC's and update her echo, she is encouraged to keep adequately hydrated and continue her exercise regime, avoid caffeine, stimulants.  Will get BMET and Mag level.  Plan f/u in 6 weeks, sooner if needed.  Consider BB if significant amount of ectopy, they are bothersome to her.  Disposition: as above  Current medicines are reviewed at length with the patient today.  The patient did not have any concerns  regarding medicines.  Haywood Lasso, PA-C 06/30/2017 8:11 AM     [facility] [address] [place] [address] [phone] (office)  [phone] (fax)

## 2017-06-30 ENCOUNTER — Encounter: Payer: Self-pay | Admitting: Physician Assistant

## 2017-06-30 ENCOUNTER — Ambulatory Visit (INDEPENDENT_AMBULATORY_CARE_PROVIDER_SITE_OTHER): Payer: PPO | Admitting: Physician Assistant

## 2017-06-30 VITALS — BP 116/70 | HR 82 | Ht 67.0 in | Wt 151.4 lb

## 2017-06-30 DIAGNOSIS — I493 Ventricular premature depolarization: Secondary | ICD-10-CM | POA: Diagnosis not present

## 2017-06-30 DIAGNOSIS — R002 Palpitations: Secondary | ICD-10-CM | POA: Diagnosis not present

## 2017-06-30 LAB — BASIC METABOLIC PANEL
BUN/Creatinine Ratio: 26 (ref 12–28)
BUN: 23 mg/dL (ref 8–27)
CO2: 24 mmol/L (ref 20–29)
Calcium: 9.6 mg/dL (ref 8.7–10.3)
Chloride: 103 mmol/L (ref 96–106)
Creatinine, Ser: 0.87 mg/dL (ref 0.57–1.00)
GFR calc Af Amer: 78 mL/min/{1.73_m2} (ref >59)
GFR calc non Af Amer: 68 mL/min/{1.73_m2} (ref >59)
Glucose: 92 mg/dL (ref 65–99)
Potassium: 4.7 mmol/L (ref 3.5–5.2)
Sodium: 140 mmol/L (ref 134–144)

## 2017-06-30 LAB — MAGNESIUM: Magnesium: 2.4 mg/dL — ABNORMAL HIGH (ref 1.6–2.3)

## 2017-06-30 NOTE — Patient Instructions (Addendum)
Medication Instructions:   Your physician recommends that you continue on your current medications as directed. Please refer to the Current Medication list given to you toda  If you need a refill on your cardiac medications before your next appointment, please call your pharmacy.  Labwork:  BMET  AND MAG TODAY    Testing/Procedures: Your physician has requested that you have an echocardiogram. Echocardiography is a painless test that uses sound waves to create images of your heart. It provides your doctor with information about the size and shape of your heart and how well your heart's chambers and valves are working. This procedure takes approximately one hour. There are no restrictions for this procedure.  Your physician has recommended that you wear a holter monitor. Holter monitors are medical devices that record the heart's electrical activity. Doctors most often use these monitors to diagnose arrhythmias. Arrhythmias are problems with the speed or rhythm of the heartbeat. The monitor is a small, portable device. You can wear one while you do your normal daily activities. This is usually used to diagnose what is causing palpitations/syncope (passing out).    Follow-Up:  In 6  WEEKS WITH RENEE URSUY OR ALLRED   Any Other Special Instructions Will Be Listed Below (If Applicable).

## 2017-07-01 DIAGNOSIS — E784 Other hyperlipidemia: Secondary | ICD-10-CM | POA: Diagnosis not present

## 2017-07-01 DIAGNOSIS — R7301 Impaired fasting glucose: Secondary | ICD-10-CM | POA: Diagnosis not present

## 2017-07-01 DIAGNOSIS — M859 Disorder of bone density and structure, unspecified: Secondary | ICD-10-CM | POA: Diagnosis not present

## 2017-07-07 DIAGNOSIS — R7301 Impaired fasting glucose: Secondary | ICD-10-CM | POA: Diagnosis not present

## 2017-07-07 DIAGNOSIS — D0511 Intraductal carcinoma in situ of right breast: Secondary | ICD-10-CM | POA: Diagnosis not present

## 2017-07-07 DIAGNOSIS — Z0000 Encounter for general adult medical examination without abnormal findings: Secondary | ICD-10-CM | POA: Diagnosis not present

## 2017-07-07 DIAGNOSIS — E784 Other hyperlipidemia: Secondary | ICD-10-CM | POA: Diagnosis not present

## 2017-07-07 DIAGNOSIS — Z1389 Encounter for screening for other disorder: Secondary | ICD-10-CM | POA: Diagnosis not present

## 2017-07-07 DIAGNOSIS — Z6823 Body mass index (BMI) 23.0-23.9, adult: Secondary | ICD-10-CM | POA: Diagnosis not present

## 2017-07-07 DIAGNOSIS — M859 Disorder of bone density and structure, unspecified: Secondary | ICD-10-CM | POA: Diagnosis not present

## 2017-07-10 ENCOUNTER — Ambulatory Visit (HOSPITAL_COMMUNITY): Payer: PPO | Attending: Physician Assistant

## 2017-07-10 ENCOUNTER — Other Ambulatory Visit: Payer: Self-pay

## 2017-07-10 ENCOUNTER — Ambulatory Visit: Payer: PPO

## 2017-07-10 DIAGNOSIS — Z87891 Personal history of nicotine dependence: Secondary | ICD-10-CM | POA: Diagnosis not present

## 2017-07-10 DIAGNOSIS — E785 Hyperlipidemia, unspecified: Secondary | ICD-10-CM | POA: Insufficient documentation

## 2017-07-10 DIAGNOSIS — R002 Palpitations: Secondary | ICD-10-CM | POA: Diagnosis not present

## 2017-07-10 DIAGNOSIS — I493 Ventricular premature depolarization: Secondary | ICD-10-CM | POA: Diagnosis not present

## 2017-07-10 DIAGNOSIS — I080 Rheumatic disorders of both mitral and aortic valves: Secondary | ICD-10-CM | POA: Diagnosis not present

## 2017-07-29 ENCOUNTER — Ambulatory Visit (INDEPENDENT_AMBULATORY_CARE_PROVIDER_SITE_OTHER): Payer: PPO

## 2017-07-29 DIAGNOSIS — R002 Palpitations: Secondary | ICD-10-CM | POA: Diagnosis not present

## 2017-07-29 DIAGNOSIS — I493 Ventricular premature depolarization: Secondary | ICD-10-CM | POA: Diagnosis not present

## 2017-08-04 DIAGNOSIS — Z85828 Personal history of other malignant neoplasm of skin: Secondary | ICD-10-CM | POA: Diagnosis not present

## 2017-08-04 DIAGNOSIS — L661 Lichen planopilaris: Secondary | ICD-10-CM | POA: Diagnosis not present

## 2017-08-04 DIAGNOSIS — L814 Other melanin hyperpigmentation: Secondary | ICD-10-CM | POA: Diagnosis not present

## 2017-08-04 DIAGNOSIS — L821 Other seborrheic keratosis: Secondary | ICD-10-CM | POA: Diagnosis not present

## 2017-08-10 ENCOUNTER — Telehealth: Payer: Self-pay | Admitting: Hematology and Oncology

## 2017-08-10 ENCOUNTER — Ambulatory Visit (HOSPITAL_BASED_OUTPATIENT_CLINIC_OR_DEPARTMENT_OTHER): Payer: PPO | Admitting: Hematology and Oncology

## 2017-08-10 ENCOUNTER — Encounter: Payer: Self-pay | Admitting: Hematology and Oncology

## 2017-08-10 DIAGNOSIS — Z170 Estrogen receptor positive status [ER+]: Secondary | ICD-10-CM

## 2017-08-10 DIAGNOSIS — Z853 Personal history of malignant neoplasm of breast: Secondary | ICD-10-CM

## 2017-08-10 DIAGNOSIS — M8580 Other specified disorders of bone density and structure, unspecified site: Secondary | ICD-10-CM | POA: Diagnosis not present

## 2017-08-10 DIAGNOSIS — C50311 Malignant neoplasm of lower-inner quadrant of right female breast: Secondary | ICD-10-CM

## 2017-08-10 MED ORDER — TAMOXIFEN CITRATE 20 MG PO TABS: 20.0000 mg | ORAL_TABLET | Freq: Every day | ORAL | 3 refills | Status: DC

## 2017-08-10 MED ORDER — ESTROGENS, CONJUGATED 0.625 MG/GM VA CREA: 1.0000 | TOPICAL_CREAM | Freq: Every day | VAGINAL | 12 refills | Status: DC

## 2017-08-10 MED ORDER — ROSUVASTATIN CALCIUM 10 MG PO TABS: 10.0000 mg | ORAL_TABLET | Freq: Every day | ORAL | Status: AC

## 2017-08-10 MED ORDER — COENZYME Q10 30 MG PO CAPS: 30.0000 mg | ORAL_CAPSULE | Freq: Three times a day (TID) | ORAL | Status: DC

## 2017-08-10 NOTE — Telephone Encounter (Signed)
Scheduled patient's appts per 9/17 los. Did now want avs or calendar.

## 2017-08-10 NOTE — Progress Notes (Signed)
Patient Care Team: Marton Redwood, MD as PCP - General (Internal Medicine)  DIAGNOSIS:  Encounter Diagnosis  Name Primary?  . Malignant neoplasm of lower-inner quadrant of right breast of female, estrogen receptor positive ([place])     SUMMARY OF ONCOLOGIC HISTORY:   Malignant neoplasm of lower-inner quadrant of right female breast ([place])   02/03/2014 Breast MRI    7 X 6X 5 mm right breast abnormality as 2 immediately adjacent 3 mm foci of enhancement      05/19/2014 Initial Diagnosis    Malignant neoplasm of lower-inner quadrant of female breast DCIS      06/13/2014 Surgery    R Lumpectomy DCIS 2 mm size Grade 1 Er 70%, PR 80%, Margins Neg      07/25/2014 - 08/25/2014 Radiation Therapy    Adjuvant radiation therapy      10/18/2014 Procedure    genetic testing was normal no mutations identified      05/07/2017 Surgery    Left lumpectomy: Complex sclerosing lesion with usual ductal hyperplasia, focal atypical lobular hyperplasia, no invasive cancer identified        CHIEF COMPLIANT: Follow-up of breast cancer, patient is interested in taking antiestrogen therapy at this time  INTERVAL HISTORY: Linda [name] is a 71 year old with above-mentioned history of right breast cancer treated with lumpectomy and radiation and she refused to write antiestrogen therapy in 2015. In June 2018 she was diagnosed with complex sclerosing lesion underwent a left lumpectomy. Because of this she is now motivated to consider taking antiestrogen therapy.  REVIEW OF SYSTEMS:   Constitutional: Denies fevers, chills or abnormal weight loss Eyes: Denies blurriness of vision Ears, nose, mouth, throat, and face: Denies mucositis or sore throat Respiratory: Denies cough, dyspnea or wheezes Cardiovascular: Denies palpitation, chest discomfort Gastrointestinal:  Denies nausea, heartburn or change in bowel habits Skin: Denies abnormal skin rashes Lymphatics: Denies new lymphadenopathy or easy  bruising Neurological:Denies numbness, tingling or new weaknesses Behavioral/Psych: Mood is stable, no new changes  Extremities: No lower extremity edema Breast:  denies any pain or lumps or nodules in either breasts All other systems were reviewed with the patient and are negative.  I have reviewed the past medical history, past surgical history, social history and family history with the patient and they are unchanged from previous note.  ALLERGIES:  is allergic to other.  MEDICATIONS:  Current Outpatient Prescriptions  Medication Sig Dispense Refill  . CALCIUM-VITAMIN D PO Chew 1 tablet by mouth twice a day    . co-enzyme Q-10 30 MG capsule Take 1 capsule (30 mg total) by mouth 3 (three) times daily.    Marland Kitchen conjugated estrogens (PREMARIN) vaginal cream Place 1 Applicatorful vaginally daily. 42.5 g 12  . fluticasone (FLONASE) 50 MCG/ACT nasal spray Place 2 sprays into both nostrils daily as needed for allergies or rhinitis.     Marland Kitchen loratadine (CLARITIN) 10 MG tablet Take 10 mg by mouth daily as needed for allergies.    . Multiple Vitamin (MULTIVITAMIN) capsule Take 1 capsule by mouth daily.      . rosuvastatin (CRESTOR) 10 MG tablet Take 1 tablet (10 mg total) by mouth daily.    . sertraline (ZOLOFT) 100 MG tablet Take 100 mg by mouth daily.    . tamoxifen (NOLVADEX) 20 MG tablet Take 1 tablet (20 mg total) by mouth daily. 90 tablet 3  . zolpidem (AMBIEN) 5 MG tablet TAKE ONE-HALF TABLET BY MOUTH EVERY NIGHT AT BEDTIME AS NEEDED FOR SLEEP  No current facility-administered medications for this visit.     PHYSICAL EXAMINATION: ECOG PERFORMANCE STATUS: 0 - Asymptomatic  Vitals:   08/10/17 1043  BP: 124/71  Pulse: 83  Resp: 18  Temp: 98.2 F (36.8 C)  SpO2: 97%   Filed Weights   08/10/17 1043  Weight: 153 lb 11.2 oz (69.7 kg)    GENERAL:alert, no distress and comfortable SKIN: skin color, texture, turgor are normal, no rashes or significant lesions EYES: normal,  Conjunctiva are pink and non-injected, sclera clear OROPHARYNX:no exudate, no erythema and lips, buccal mucosa, and tongue normal  NECK: supple, thyroid normal size, non-tender, without nodularity LYMPH:  no palpable lymphadenopathy in the cervical, axillary or inguinal LUNGS: clear to auscultation and percussion with normal breathing effort HEART: regular rate & rhythm and no murmurs and no lower extremity edema ABDOMEN:abdomen soft, non-tender and normal bowel sounds MUSCULOSKELETAL:no cyanosis of digits and no clubbing  NEURO: alert & oriented x 3 with fluent speech, no focal motor/sensory deficits EXTREMITIES: No lower extremity edema BREAST: No palpable masses or nodules in either right or left breasts. No palpable axillary supraclavicular or infraclavicular adenopathy no breast tenderness or nipple discharge. (exam performed in the presence of a chaperone)  LABORATORY DATA:  I have reviewed the data as listed   Chemistry      Component Value Date/Time   NA 140 06/30/2017 0904   K 4.7 06/30/2017 0904   CL 103 06/30/2017 0904   CO2 24 06/30/2017 0904   BUN 23 06/30/2017 0904   CREATININE 0.87 06/30/2017 0904      Component Value Date/Time   CALCIUM 9.6 06/30/2017 0904       Lab Results  Component Value Date   WBC 5.8 09/10/2014   HGB 13.8 09/10/2014   HCT 40.1 09/10/2014   MCV 99.0 09/10/2014   PLT 266 09/10/2014   NEUTROABS 4.1 06/08/2014    ASSESSMENT & PLAN:  Malignant neoplasm of lower-inner quadrant of right female breast Right breast DCIS ER/PR positive diagnosed March 2015 7 mm focus on MRI and alert lumpectomy 06/13/2014 2 mm focus of DCIS negative margins, ER 70%, PR 80% positive status post radiation therapy; patient was offered antiestrogen therapy but she refused and is currently on surveillance  05/07/2017:Left lumpectomy: Complex sclerosing lesion with usual ductal hyperplasia, focal atypical lobular hyperplasia, no invasive cancer  identified  Surveillance: 1. Annual mammograms to be done in March of every year 2. breast exam March 2018: Benign  Patient decided not to take antiestrogen therapy. Osteopenia: Off Boniva. She only takes calcium and vitamin D Bone density 10 2015: T score -1.8.  Bone density 2017: T score -2  Patient would like to consider taking tamoxifen at this time. We discussed the pros and cons of tamoxifen therapy including the risks of hot flashes myalgias risk of DVT as well as uterine hypertrophy. She is most excited that tamoxifen could prevent progression to osteoporosis as well. We discussed the fact that she is on Zoloft for depression for the past 6 months. She will discuss with her physician if it can be switched to Effexor.  Return to clinic in 3 months for follow-up and toxicity check  I spent 25 minutes talking to the patient of which more than half was spent in counseling and coordination of care.  No orders of the defined types were placed in this encounter.  The patient has a good understanding of the overall plan. she agrees with it. she will call with any problems that  may develop before the next visit here.   Rulon Eisenmenger, MD 08/10/17

## 2017-08-10 NOTE — Assessment & Plan Note (Signed)
Right breast DCIS ER/PR positive diagnosed March 2015 7 mm focus on MRI and alert lumpectomy 06/13/2014 2 mm focus of DCIS negative margins, ER 70%, PR 80% positive status post radiation therapy; patient was offered antiestrogen therapy but she refused and is currently on surveillance  05/07/2017:Left lumpectomy: Complex sclerosing lesion with usual ductal hyperplasia, focal atypical lobular hyperplasia, no invasive cancer identified  Surveillance: 1. Annual mammograms to be done in March of every year 2. breast exam March 2018: Benign  Patient decided not to take antiestrogen therapy. Osteopenia: On Boniva 1 calcium and vitamin D Bone density 10 2015: T score -1.8. Patient will need a new bone density test  Return to clinic in 1 year for follow-up

## 2017-08-12 NOTE — Progress Notes (Signed)
Cardiology Office Note Date:  08/14/2017  Patient ID:  Bular, Hickok [birth year], MRN [id number] PCP:  Marton Redwood, MD  Cardiologist:  Dr. Rayann Heman, (2015)    Chief Complaint: over-due visit  History of Present Illness: Linda [name] is a 71 y.o. female with history of palpitations, PVCs, HLD, right breast cancer s/p lumpectomy w/Rad tx.  She comes in today to be seen for Dr. Rayann Heman, last seen byhim with a single visit in 2015 after a breif hospital stay for palpitations that noted PVCs, and echo Jan 2016 noted normal LVEF, no significant VHD, an ETT was negative for ischemic changes.  Since then she had been doing well without much in the way of palpitations.  Until last month, she was seen by myself 06/30/17, she reported that for a couple weeks she has become aware of the palpitations again.  They are the same as before though more frequent.  No CP or SOB, no dizziness, near syncope or syncope.  She reported regular exercise walking 3 days a week for exercise and her exertional capacity remained good, perhaps feeling like hills make her more winded then usual but able to keep her pace and continue.  This is a slight observation and she has no difficulties with her ADLs.  She was drinking a cola every morning and did stopped that in the last week without change to the palpitations, reported good water intake.  No new medicines, in-fact her PMD had her stop her statin 2/2 some myalgias that resolved off it and is planned for re-visit to discuss alternative agent.  She has not been ill, no unusual stressors.  She was planned for an echo that looked good with normal LVEF and a holter that showed only 1% PVC burden, labs noted K+ and Mag both good as well.  She reports today her palpitations seem to be less.  She has some days she notices them more, most other days not at all or very little.  They do not interfere with her ability to do ADL's, or activities she wants/needs to participate in.  No  CP, SOB, DOE, no dizziness, near syncope or syncope.  Past Medical History:  Diagnosis Date  . Anxiety   . Arthritis   . Atrophic vaginitis   . Breast cancer ([place])    right (DCIS) s/p lumpectomy 7/15  . Cataract    BILATERAL  . Cervical dysplasia 1975  . DCIS (ductal carcinoma in situ)    RIGHT  . Depression   . Family history of breast cancer   . Family history of colon cancer   . Family history of prostate cancer   . Hx of radiation therapy 08/02/14- 08/24/14   right reast 4256 cGy i 16 sessions, no boost  . Hypercholesteremia   . Insomnia   . Osteopenia 11/2011   T score -1.7 FRAX not calculated due to history of bisphosphonates and current Evista  . Personal history of radiation therapy 2015   right side  . Seasonal allergies   . Skin cancer 2006   basal cell of face    Past Surgical History:  Procedure Laterality Date  . APPENDECTOMY  1955  . BREAST BIOPSY  2010   rt bx  . BREAST BIOPSY  2009   rt  . BREAST LUMPECTOMY  1983   BENIGN-rt  . BREAST LUMPECTOMY WITH RADIOACTIVE SEED LOCALIZATION Right 06/13/2014   Procedure: RIGHT BREAST  RADIOACTIVE SEED GUIDED LUMPECTOMY;  Surgeon: Rolm Bookbinder, MD;  Location: [facility];  Service: General;  Laterality: Right;for DCIS  . CATARACT EXTRACTION     Both eyes  . cataract surgery  2005  . COLONOSCOPY    . COLPOSCOPY    . EXCISION OF BASAL CELL CA FROM [facility]  2006  . FACELIFT  2014  . FACIAL COSMETIC SURGERY    . GYNECOLOGIC CRYOSURGERY    . KNEE SURGERY  2013   Arthroscopic-rt  . RADIOACTIVE SEED GUIDED EXCISIONAL BREAST BIOPSY Left 05/07/2017   Procedure: LEFT RADIOACTIVE SEED GUIDED EXCISIONAL BREAST BIOPSY;  Surgeon: Rolm Bookbinder, MD;  Location: [facility];  Service: General;  Laterality: Left;    Current Outpatient Prescriptions  Medication Sig Dispense Refill  . CALCIUM-VITAMIN D PO Chew 1 tablet by mouth twice a day    . co-enzyme Q-10 30 MG capsule Take 1 capsule (30 mg  total) by mouth 3 (three) times daily.    Marland Kitchen conjugated estrogens (PREMARIN) vaginal cream Place 1 Applicatorful vaginally daily. 42.5 g 12  . fluticasone (FLONASE) 50 MCG/ACT nasal spray Place 2 sprays into both nostrils daily as needed for allergies or rhinitis.     Marland Kitchen loratadine (CLARITIN) 10 MG tablet Take 10 mg by mouth daily as needed for allergies.    . Multiple Vitamin (MULTIVITAMIN) capsule Take 1 capsule by mouth daily.      . rosuvastatin (CRESTOR) 10 MG tablet Take 1 tablet (10 mg total) by mouth daily.    . sertraline (ZOLOFT) 100 MG tablet Take 100 mg by mouth daily.    . tamoxifen (NOLVADEX) 20 MG tablet Take 1 tablet (20 mg total) by mouth daily. 90 tablet 3  . zolpidem (AMBIEN) 5 MG tablet TAKE ONE-HALF TABLET BY MOUTH EVERY NIGHT AT BEDTIME AS NEEDED FOR SLEEP     No current facility-administered medications for this visit.     Allergies:   Other   Social History:  The patient  reports that she quit smoking about 43 years ago. She has never used smokeless tobacco. She reports that she drinks about 3.0 oz of alcohol per week . She reports that she does not use drugs.   Family History:  The patient's family history includes Breast cancer in her maternal grandmother, mother, and sister; Cancer in her paternal grandfather; Colon cancer in her paternal grandfather; Diabetes in her son; Heart disease in her maternal grandfather; Heart failure in her son; Hyperlipidemia in her mother; Hypertension in her maternal grandmother; Kidney cancer in her paternal grandmother; Osteoporosis in her mother; Prostate cancer in her maternal uncle; Stroke in her maternal grandfather.  ROS:  Please see the history of present illness.    All other systems are reviewed and otherwise negative.   PHYSICAL EXAM:  VS:  BP 96/60   Pulse 85   Ht 5\' 7"  (1.702 m)   Wt 152 lb (68.9 kg)   SpO2 95%   BMI 23.81 kg/m  BMI: Body mass index is 23.81 kg/m. Well nourished, well developed, healthy appearing, in  no acute distress  HEENT: normocephalic, atraumatic  Neck: no JVD, carotid bruits or masses Cardiac:  RRR; no significant murmurs, no rubs, or gallops Lungs:   CTA b/l, no wheezing, rhonchi or rales  Abd: soft, nontender MS: no deformity or atrophy Ext: no edema  Skin: warm and dry, no rash Neuro:  No gross deficits appreciated Psych: euthymic mood, full affect   EKG: not done today  48 hour monitor Sinus rhythm with occasional premature ventricular contractions 3387 PVCs (1%)  are noted, at times in trigeminy Nonsustained atrial tachycardia No sustained arrhythmias No pauses or AV block  07/10/17: TTE Study Conclusions - Left ventricle: The cavity size was normal. Wall thickness was   normal. Systolic function was normal. The estimated ejection   fraction was in the range of 55% to 60%. Wall motion was normal;   there were no regional wall motion abnormalities. Doppler   parameters are consistent with abnormal left ventricular   relaxation (grade 1 diastolic dysfunction). - Aortic valve: There was mild regurgitation. - Mitral valve: Calcified annulus. Impressions: - Normal LV systolic function; mild diastolic dysfunction;   sclerotic aortic valve with mild AI.  12/04/14: TTE Study Conclusions - Left ventricle: The cavity size was normal. Systolic function was normal. The estimated ejection fraction was in the range of 55% to 60%. Wall motion was normal; there were no regional wall motion abnormalities. - Aortic valve: There was mild regurgitation. - Atrial septum: No defect or patent foramen ovale was identified.  12/12/14: ETT ETT Interpretation:  normal - no evidence of ischemia by ST analysis Comments: Patient presents today for routine GXT. Has had episode of PVCs that necessitated ER visit.  Echo obtained which was basically normal. She has been doing ok without issue since her last visit here.   Today the patient exercised on the standard Bruce protocol for  a total of 9 minutes.  Good exercise tolerance.  Adequate blood pressure response.  Clinically negative for chest pain. Test was stopped due to achievement of target HR.  EKG negative for ischemia. No significant arrhythmia noted. 2 PVCs noted at peak of exercise.   Recent Labs: 06/30/2017: BUN 23; Creatinine, Ser 0.87; Magnesium 2.4; Potassium 4.7; Sodium 140  No results found for requested labs within last 8760 hours.   CrCl cannot be calculated (Patient's most recent lab result is older than the maximum 21 days allowed.).   Wt Readings from Last 3 Encounters:  08/14/17 152 lb (68.9 kg)  08/10/17 153 lb 11.2 oz (69.7 kg)  06/30/17 151 lb 6.4 oz (68.7 kg)     Other studies reviewed: Additional studies/records reviewed today include: summarized above  ASSESSMENT AND PLAN:  1. Hx of palpitations, PVCs     Intermittent palpitations, no significant ectopy, no concerning monitor findings     LVEF normal, no significant VHD, labs OK     Recheck on her BP is 108/70  For now, no medical tx, PVC burden is not significant, her BP may be limiting.  We revisited hydration, avoiding stimulants, and cardiovascular exercise.  She mentions PMD is likely going to be changing her Zoloft to Effexor, reviewed with RPH, low likelihood though some tachycardia/palpitations were reported with Zolft, perhaps this change will help as well.       Disposition: 6 mo ROV, sooner if needed  Current medicines are reviewed at length with the patient today.  The patient did not have any concerns regarding medicines.  Haywood Lasso, PA-C 08/14/2017 9:30 AM     [address] [place] [id number] [phone] (office)  [phone] (fax)

## 2017-08-14 ENCOUNTER — Ambulatory Visit (INDEPENDENT_AMBULATORY_CARE_PROVIDER_SITE_OTHER): Payer: PPO | Admitting: Physician Assistant

## 2017-08-14 ENCOUNTER — Encounter: Payer: Self-pay | Admitting: Physician Assistant

## 2017-08-14 VITALS — BP 96/60 | HR 85 | Ht 67.0 in | Wt 152.0 lb

## 2017-08-14 DIAGNOSIS — R002 Palpitations: Secondary | ICD-10-CM | POA: Diagnosis not present

## 2017-08-14 DIAGNOSIS — I493 Ventricular premature depolarization: Secondary | ICD-10-CM

## 2017-08-14 NOTE — Patient Instructions (Signed)
Medication Instructions: Your physician recommends that you continue on your current medications as directed. Please refer to the Current Medication list given to you today.  Labwork: None Ordered  Procedures/Testing: None Ordered  Follow-Up: Your physician wants you to follow-up in: 6 MONTHS with Marinus Maw, PA. You will receive a reminder letter in the mail two months in advance. If you don't receive a letter, please call our office to schedule the follow-up appointment.   If you need a refill on your cardiac medications before your next appointment, please call your pharmacy.

## 2017-09-01 DIAGNOSIS — F411 Generalized anxiety disorder: Secondary | ICD-10-CM | POA: Diagnosis not present

## 2017-10-12 DIAGNOSIS — R6884 Jaw pain: Secondary | ICD-10-CM | POA: Diagnosis not present

## 2017-10-12 DIAGNOSIS — Z6824 Body mass index (BMI) 24.0-24.9, adult: Secondary | ICD-10-CM | POA: Diagnosis not present

## 2017-10-26 DIAGNOSIS — H52203 Unspecified astigmatism, bilateral: Secondary | ICD-10-CM | POA: Diagnosis not present

## 2017-10-26 DIAGNOSIS — H04123 Dry eye syndrome of bilateral lacrimal glands: Secondary | ICD-10-CM | POA: Diagnosis not present

## 2017-10-26 DIAGNOSIS — H43813 Vitreous degeneration, bilateral: Secondary | ICD-10-CM | POA: Diagnosis not present

## 2017-10-26 DIAGNOSIS — H16103 Unspecified superficial keratitis, bilateral: Secondary | ICD-10-CM | POA: Diagnosis not present

## 2017-10-28 DIAGNOSIS — F411 Generalized anxiety disorder: Secondary | ICD-10-CM | POA: Diagnosis not present

## 2017-11-09 NOTE — Assessment & Plan Note (Signed)
Right breast DCIS ER/PR positive diagnosed March 2015 7 mm focus on MRI and alert lumpectomy 06/13/2014 2 mm focus of DCIS negative margins, ER 70%, PR 80% positive status post radiation therapy; patient was offered antiestrogen therapy but she refused and is currently on surveillance  05/07/2017:Left lumpectomy: Complex sclerosing lesion with usual ductal hyperplasia, focal atypical lobular hyperplasia, no invasive cancer identified  Surveillance: 1. Annual mammograms to be done in March of every year 2. breast exam March 2018: Benign  Patient decided not to take antiestrogen therapy. Osteopenia: On Boniva 1 calcium and vitamin D Bone density 10 2015: T score -1.8. Patient will need a new bone density test  Return to clinic in 1 year for follow-up

## 2017-11-10 ENCOUNTER — Telehealth: Payer: Self-pay | Admitting: Hematology and Oncology

## 2017-11-10 ENCOUNTER — Ambulatory Visit (HOSPITAL_BASED_OUTPATIENT_CLINIC_OR_DEPARTMENT_OTHER): Payer: PPO | Admitting: Hematology and Oncology

## 2017-11-10 DIAGNOSIS — M8580 Other specified disorders of bone density and structure, unspecified site: Secondary | ICD-10-CM | POA: Diagnosis not present

## 2017-11-10 DIAGNOSIS — Z1231 Encounter for screening mammogram for malignant neoplasm of breast: Secondary | ICD-10-CM

## 2017-11-10 DIAGNOSIS — Z170 Estrogen receptor positive status [ER+]: Secondary | ICD-10-CM | POA: Diagnosis not present

## 2017-11-10 DIAGNOSIS — C50311 Malignant neoplasm of lower-inner quadrant of right female breast: Secondary | ICD-10-CM

## 2017-11-10 DIAGNOSIS — D0511 Intraductal carcinoma in situ of right breast: Secondary | ICD-10-CM | POA: Diagnosis not present

## 2017-11-10 MED ORDER — VENLAFAXINE HCL ER 75 MG PO CP24: 75.0000 mg | ORAL_CAPSULE | Freq: Every day | ORAL | Status: DC

## 2017-11-10 NOTE — Progress Notes (Signed)
Patient Care Team: Linda Redwood, MD as PCP - General (Internal Medicine)  DIAGNOSIS:  Encounter Diagnoses  Name Primary?  . Malignant neoplasm of lower-inner quadrant of right breast of female, estrogen receptor positive ([place])   . Encounter for screening mammogram for malignant neoplasm of breast     SUMMARY OF ONCOLOGIC HISTORY:   Malignant neoplasm of lower-inner quadrant of right female breast (Linda [name])   02/03/2014 Breast MRI    7 X 6X 5 mm right breast abnormality as 2 immediately adjacent 3 mm foci of enhancement      05/19/2014 Initial Diagnosis    Malignant neoplasm of lower-inner quadrant of female breast DCIS      06/13/2014 Surgery    R Lumpectomy DCIS 2 mm size Grade 1 Er 70%, PR 80%, Margins Neg      07/25/2014 - 08/25/2014 Radiation Therapy    Adjuvant radiation therapy      10/18/2014 Procedure    genetic testing was normal no mutations identified      05/07/2017 Surgery    Left lumpectomy: Complex sclerosing lesion with usual ductal hyperplasia, focal atypical lobular hyperplasia, no invasive cancer identified       08/10/2017 -  Anti-estrogen oral therapy    Tamoxifen 20 mg daily       CHIEF COMPLIANT: Follow-up of right breast cancer on tamoxifen  INTERVAL HISTORY: Linda [name] is a 71 year old with above-mentioned history of right breast cancer who is currently on surveillance.  She underwent lumpectomy.  After much reluctance she decided to take tamoxifen.  She is tolerating tamoxifen extremely well.  She did have palpitations related to Effexor.  She denies any lumps or nodules in the breast.  REVIEW OF SYSTEMS:   Constitutional: Denies fevers, chills or abnormal weight loss Eyes: Denies blurriness of vision Ears, nose, mouth, throat, and face: Denies mucositis or sore throat Respiratory: Denies cough, dyspnea or wheezes Cardiovascular: Denies palpitation, chest discomfort Gastrointestinal:  Denies nausea, heartburn or change in bowel habits Skin:  Denies abnormal skin rashes Lymphatics: Denies new lymphadenopathy or easy bruising Neurological:Denies numbness, tingling or new weaknesses Behavioral/Psych: Mood is stable, no new changes  Extremities: No lower extremity edema Breast:  denies any pain or lumps or nodules in either breasts All other systems were reviewed with the patient and are negative.  I have reviewed the past medical history, past surgical history, social history and family history with the patient and they are unchanged from previous note.  ALLERGIES:  is allergic to other.  MEDICATIONS:  Current Outpatient Medications  Medication Sig Dispense Refill  . CALCIUM-VITAMIN D PO Chew 1 tablet by mouth twice a day    . co-enzyme Q-10 30 MG capsule Take 1 capsule (30 mg total) by mouth 3 (three) times daily.    Marland Kitchen conjugated estrogens (PREMARIN) vaginal cream Place 1 Applicatorful vaginally daily. 42.5 g 12  . fluticasone (FLONASE) 50 MCG/ACT nasal spray Place 2 sprays into both nostrils daily as needed for allergies or rhinitis.     Marland Kitchen loratadine (CLARITIN) 10 MG tablet Take 10 mg by mouth daily as needed for allergies.    . Multiple Vitamin (MULTIVITAMIN) capsule Take 1 capsule by mouth daily.      . rosuvastatin (CRESTOR) 10 MG tablet Take 1 tablet (10 mg total) by mouth daily.    . tamoxifen (NOLVADEX) 20 MG tablet Take 1 tablet (20 mg total) by mouth daily. 90 tablet 3  . venlafaxine XR (EFFEXOR-XR) 75 MG 24 hr capsule Take 1 capsule (  75 mg total) by mouth daily with breakfast.    . zolpidem (AMBIEN) 5 MG tablet TAKE ONE-HALF TABLET BY MOUTH EVERY NIGHT AT BEDTIME AS NEEDED FOR SLEEP     No current facility-administered medications for this visit.     PHYSICAL EXAMINATION: ECOG PERFORMANCE STATUS: 1 - Symptomatic but completely ambulatory  Vitals:   11/10/17 1417  BP: 134/80  Pulse: 83  Resp: 18  Temp: 97.9 F (36.6 C)  SpO2: 99%   Filed Weights   11/10/17 1417  Weight: 153 lb 12.8 oz (69.8 kg)     GENERAL:alert, no distress and comfortable SKIN: skin color, texture, turgor are normal, no rashes or significant lesions EYES: normal, Conjunctiva are pink and non-injected, sclera clear OROPHARYNX:no exudate, no erythema and lips, buccal mucosa, and tongue normal  NECK: supple, thyroid normal size, non-tender, without nodularity LYMPH:  no palpable lymphadenopathy in the cervical, axillary or inguinal LUNGS: clear to auscultation and percussion with normal breathing effort HEART: regular rate & rhythm and no murmurs and no lower extremity edema ABDOMEN:abdomen soft, non-tender and normal bowel sounds MUSCULOSKELETAL:no cyanosis of digits and no clubbing  NEURO: alert & oriented x 3 with fluent speech, no focal motor/sensory deficits EXTREMITIES: No lower extremity edema BREAST: No palpable masses or nodules in either right or left breasts. No palpable axillary supraclavicular or infraclavicular adenopathy no breast tenderness or nipple discharge. (exam performed in the presence of a chaperone)  LABORATORY DATA:  I have reviewed the data as listed   Chemistry      Component Value Date/Time   NA 140 06/30/2017 0904   K 4.7 06/30/2017 0904   CL 103 06/30/2017 0904   CO2 24 06/30/2017 0904   BUN 23 06/30/2017 0904   CREATININE 0.87 06/30/2017 0904      Component Value Date/Time   CALCIUM 9.6 06/30/2017 0904       Lab Results  Component Value Date   WBC 5.8 09/10/2014   HGB 13.8 09/10/2014   HCT 40.1 09/10/2014   MCV 99.0 09/10/2014   PLT 266 09/10/2014   NEUTROABS 4.1 06/08/2014    ASSESSMENT & PLAN:  Malignant neoplasm of lower-inner quadrant of right female breast Right breast DCIS ER/PR positive diagnosed March 2015 7 mm focus on MRI and alert lumpectomy 06/13/2014 2 mm focus of DCIS negative margins, ER 70%, PR 80% positive status post radiation therapy; patient was offered antiestrogen therapy but she refused and is currently on surveillance  05/07/2017:Left  lumpectomy: Complex sclerosing lesion with usual ductal hyperplasia, focal atypical lobular hyperplasia, no invasive cancer identified  Surveillance: 1. Annual mammograms to be done in March 2019; breast MRIs will be ordered for September 2019 2. breast exam 11/10/2017: Benign  Patient decided not to take antiestrogen therapy. Osteopenia: On Boniva 1 calcium and vitamin D Bone density 10 2015: T score -1.8. Patient will need a new bone density test  Return to clinic in 1 year for follow-up  I spent 25 minutes talking to the patient of which more than half was spent in counseling and coordination of care.  Orders Placed This Encounter  Procedures  . MM DIAG BREAST TOMO BILATERAL    Standing Status:   Future    Standing Expiration Date:   11/10/2018    Order Specific Question:   Reason for Exam (SYMPTOM  OR DIAGNOSIS REQUIRED)    Answer:   Breast cancer annnual follow up    Order Specific Question:   Preferred imaging location?    Answer:  [facility]  . MR BREAST BILATERAL W WO CONTRAST    Standing Status:   Future    Standing Expiration Date:   01/11/2019    Order Specific Question:   If indicated for the ordered procedure, I authorize the administration of contrast media per Radiology protocol    Answer:   Yes    Order Specific Question:   What is the patient's sedation requirement?    Answer:   No Sedation    Order Specific Question:   Does the patient have a pacemaker or implanted devices?    Answer:   No    Order Specific Question:   Radiology Contrast Protocol - do NOT remove file path    Answer:   [email or web address]    Order Specific Question:   Reason for Exam additional comments    Answer:   > 20% risk of breast cancer    Order Specific Question:   Preferred imaging location?    Answer:   [facility] (table limit-350 lbs)   The patient has a good understanding of the overall plan. she agrees with it. she will call with any  problems that may develop before the next visit here.   Rulon Eisenmenger, MD 11/10/17

## 2017-11-10 NOTE — Telephone Encounter (Signed)
Scheduled appt per 12/18 los - didn't want avs or calendar.

## 2018-01-27 ENCOUNTER — Ambulatory Visit: Payer: PPO | Admitting: Internal Medicine

## 2018-02-03 ENCOUNTER — Ambulatory Visit: Payer: PPO | Admitting: Internal Medicine

## 2018-02-04 DIAGNOSIS — F411 Generalized anxiety disorder: Secondary | ICD-10-CM | POA: Diagnosis not present

## 2018-02-05 ENCOUNTER — Ambulatory Visit
Admission: RE | Admit: 2018-02-05 | Discharge: 2018-02-05 | Disposition: A | Discharge status: Home / Self Care | Payer: PPO | Source: Ambulatory Visit | Attending: Hematology and Oncology | Admitting: Hematology and Oncology

## 2018-02-05 DIAGNOSIS — Z1231 Encounter for screening mammogram for malignant neoplasm of breast: Secondary | ICD-10-CM

## 2018-02-05 DIAGNOSIS — Z170 Estrogen receptor positive status [ER+]: Secondary | ICD-10-CM

## 2018-02-05 DIAGNOSIS — C50311 Malignant neoplasm of lower-inner quadrant of right female breast: Secondary | ICD-10-CM

## 2018-02-05 DIAGNOSIS — R922 Inconclusive mammogram: Secondary | ICD-10-CM | POA: Diagnosis not present

## 2018-02-15 ENCOUNTER — Encounter: Payer: Self-pay | Admitting: Internal Medicine

## 2018-02-15 ENCOUNTER — Ambulatory Visit: Payer: PPO | Admitting: Internal Medicine

## 2018-02-15 VITALS — BP 118/70 | HR 86 | Ht 67.0 in | Wt 155.0 lb

## 2018-02-15 DIAGNOSIS — I493 Ventricular premature depolarization: Secondary | ICD-10-CM

## 2018-02-15 DIAGNOSIS — R002 Palpitations: Secondary | ICD-10-CM

## 2018-02-15 NOTE — Patient Instructions (Addendum)

## 2018-02-15 NOTE — Progress Notes (Signed)
PCP: Marton Redwood, MD   Primary EP: Dr Dominica Severin is a 72 y.o. female who presents today for routine electrophysiology followup.  Since last being seen in our clinic, the patient reports doing very well.  Today, she denies symptoms of palpitations, chest pain, shortness of breath,  lower extremity edema, dizziness, presyncope, or syncope.  The patient is otherwise without complaint today.   Past Medical History:  Diagnosis Date  . Anxiety   . Arthritis   . Atrophic vaginitis   . Breast cancer ([place])    right (DCIS) s/p lumpectomy 7/15  . Cataract    BILATERAL  . Cervical dysplasia 1975  . DCIS (ductal carcinoma in situ)    RIGHT  . Depression   . Family history of breast cancer   . Family history of colon cancer   . Family history of prostate cancer   . Hx of radiation therapy 08/02/14- 08/24/14   right reast 4256 cGy i 16 sessions, no boost  . Hypercholesteremia   . Insomnia   . Osteopenia 11/2011   T score -1.7 FRAX not calculated due to history of bisphosphonates and current Evista  . Personal history of radiation therapy 2015   right side  . Seasonal allergies   . Skin cancer 2006   basal cell of face   Past Surgical History:  Procedure Laterality Date  . APPENDECTOMY  1955  . BREAST BIOPSY  2010   rt bx  . BREAST BIOPSY  2009   rt  . BREAST EXCISIONAL BIOPSY Left   . BREAST LUMPECTOMY  1983   BENIGN-rt  . BREAST LUMPECTOMY WITH RADIOACTIVE SEED LOCALIZATION Right 06/13/2014   Procedure: RIGHT BREAST  RADIOACTIVE SEED GUIDED LUMPECTOMY;  Surgeon: Rolm Bookbinder, MD;  Location: [place];  Service: General;  Laterality: Right;for DCIS  . CATARACT EXTRACTION     Both eyes  . cataract surgery  2005  . COLONOSCOPY    . COLPOSCOPY    . EXCISION OF BASAL CELL CA FROM [facility]  2006  . FACELIFT  2014  . FACIAL COSMETIC SURGERY    . GYNECOLOGIC CRYOSURGERY    . KNEE SURGERY  2013   Arthroscopic-rt  . RADIOACTIVE SEED GUIDED EXCISIONAL  BREAST BIOPSY Left 05/07/2017   Procedure: LEFT RADIOACTIVE SEED GUIDED EXCISIONAL BREAST BIOPSY;  Surgeon: Rolm Bookbinder, MD;  Location: [place];  Service: General;  Laterality: Left;    ROS- all systems are reviewed and negatives except as per HPI above  Current Outpatient Medications  Medication Sig Dispense Refill  . ALPRAZolam (XANAX) 0.5 MG tablet Take 0.5 mg by mouth as needed for anxiety or sleep.    . busPIRone (BUSPAR) 5 MG tablet Take 1 tablet by mouth daily.  2  . CALCIUM-VITAMIN D PO Chew 1 tablet by mouth twice a day    . conjugated estrogens (PREMARIN) vaginal cream Place 1 Applicatorful vaginally daily. 42.5 g 12  . loratadine (CLARITIN) 10 MG tablet Take 10 mg by mouth daily as needed for allergies.    . Multiple Vitamin (MULTIVITAMIN) capsule Take 1 capsule by mouth daily.      . rosuvastatin (CRESTOR) 10 MG tablet Take 1 tablet (10 mg total) by mouth daily.    . tamoxifen (NOLVADEX) 20 MG tablet Take 1 tablet (20 mg total) by mouth daily. 90 tablet 3  . zolpidem (AMBIEN) 5 MG tablet Take 1.25 mg by mouth at bedtime as needed for sleep (cuts tablwt in  quarters).     No current facility-administered medications for this visit.     Physical Exam: Vitals:   02/15/18 1243  BP: 118/70  Pulse: 86  SpO2: 96%  Weight: 155 lb (70.3 kg)  Height: 5\' 7"  (1.702 m)    GEN- The patient is well appearing, alert and oriented x 3 today.   Head- normocephalic, atraumatic Eyes-  Sclera clear, conjunctiva pink Ears- hearing intact Oropharynx- clear Lungs- Clear to ausculation bilaterally, normal work of breathing Heart- Regular rate and rhythm, no murmurs, rubs or gallops, PMI not laterally displaced GI- soft, NT, ND, + BS Extremities- no clubbing, cyanosis, or edema  EKG tracing ordered today is personally reviewed and shows sinus rhythm, no arrhythmias  Echo 07/10/17 reveals normal LV size and function, mild AI, reviewed with her today  Assessment and  Plan:  1. PVCs Well controlled No further workup planned  2. Breast cancer Well treated with lumpectomy and radiation On tamoxifen Did not require chemotherapy Consider echo every 2 years  Return to see EP PA in a year  Thompson Grayer MD, [facility] 02/15/2018 12:56 PM

## 2018-02-18 DIAGNOSIS — E7849 Other hyperlipidemia: Secondary | ICD-10-CM | POA: Diagnosis not present

## 2018-02-19 DIAGNOSIS — Z1212 Encounter for screening for malignant neoplasm of rectum: Secondary | ICD-10-CM | POA: Diagnosis not present

## 2018-03-25 ENCOUNTER — Ambulatory Visit (INDEPENDENT_AMBULATORY_CARE_PROVIDER_SITE_OTHER): Payer: PPO

## 2018-03-25 ENCOUNTER — Ambulatory Visit (INDEPENDENT_AMBULATORY_CARE_PROVIDER_SITE_OTHER): Payer: PPO | Admitting: Podiatry

## 2018-03-25 ENCOUNTER — Encounter: Payer: Self-pay | Admitting: Podiatry

## 2018-03-25 VITALS — BP 127/78 | HR 78

## 2018-03-25 DIAGNOSIS — M2041 Other hammer toe(s) (acquired), right foot: Secondary | ICD-10-CM

## 2018-03-25 DIAGNOSIS — S99922A Unspecified injury of left foot, initial encounter: Secondary | ICD-10-CM

## 2018-03-25 NOTE — Progress Notes (Signed)
Subjective:   Patient ID: Linda [name], female   DOB: 72 y.o.   MRN: [id number]   HPI Patient presents stating this third toe my left foot has been red and I traumatized and then I have a hammertoe fifth digit of my right foot that can be bothersome.  Patient does not smoke and likes to be active   Review of Systems  All other systems reviewed and are negative.       Objective:  Physical Exam  Constitutional: She appears well-developed and well-nourished.  Cardiovascular: Intact distal pulses.  Pulmonary/Chest: Effort normal.  Musculoskeletal: Normal range of motion.  Neurological: She is alert.  Skin: Skin is warm.  Nursing note and vitals reviewed.   Neurovascular status found to be intact muscle strength is adequate patient found to have an inflamed third digit left distal medial portion that is localized with no drainage or no throbbing associated with it with an elevated fifth digit right with pain.  Patient's found to have good digital perfusion well oriented x3     Assessment:  Traumatized third digit left with possibility for other issues and hammertoe deformity fifth right     Plan:  H&P conditions reviewed and recommended soaks therapy I do not recommend correction of the hammertoe fifth right and left which become painful  X-ray indicates that there is elevation fifth digit right with enlargement had a proximal phalanx and I did not note fracture third digit left

## 2018-04-26 DIAGNOSIS — M79672 Pain in left foot: Secondary | ICD-10-CM | POA: Diagnosis not present

## 2018-05-10 DIAGNOSIS — F411 Generalized anxiety disorder: Secondary | ICD-10-CM | POA: Diagnosis not present

## 2018-05-19 DIAGNOSIS — M79672 Pain in left foot: Secondary | ICD-10-CM | POA: Diagnosis not present

## 2018-05-25 DIAGNOSIS — M79672 Pain in left foot: Secondary | ICD-10-CM | POA: Diagnosis not present

## 2018-06-17 DIAGNOSIS — Z803 Family history of malignant neoplasm of breast: Secondary | ICD-10-CM | POA: Diagnosis not present

## 2018-06-17 DIAGNOSIS — M8580 Other specified disorders of bone density and structure, unspecified site: Secondary | ICD-10-CM | POA: Diagnosis not present

## 2018-06-17 DIAGNOSIS — Z800 Family history of malignant neoplasm of digestive organs: Secondary | ICD-10-CM | POA: Diagnosis not present

## 2018-06-17 DIAGNOSIS — Z7983 Long term (current) use of bisphosphonates: Secondary | ICD-10-CM | POA: Diagnosis not present

## 2018-06-17 DIAGNOSIS — Z87891 Personal history of nicotine dependence: Secondary | ICD-10-CM | POA: Diagnosis not present

## 2018-06-17 DIAGNOSIS — Z853 Personal history of malignant neoplasm of breast: Secondary | ICD-10-CM | POA: Diagnosis not present

## 2018-06-17 DIAGNOSIS — Z124 Encounter for screening for malignant neoplasm of cervix: Secondary | ICD-10-CM | POA: Diagnosis not present

## 2018-06-17 DIAGNOSIS — Z1151 Encounter for screening for human papillomavirus (HPV): Secondary | ICD-10-CM | POA: Diagnosis not present

## 2018-06-17 DIAGNOSIS — N952 Postmenopausal atrophic vaginitis: Secondary | ICD-10-CM | POA: Diagnosis not present

## 2018-07-05 DIAGNOSIS — R7301 Impaired fasting glucose: Secondary | ICD-10-CM | POA: Diagnosis not present

## 2018-07-05 DIAGNOSIS — M859 Disorder of bone density and structure, unspecified: Secondary | ICD-10-CM | POA: Diagnosis not present

## 2018-07-05 DIAGNOSIS — E7849 Other hyperlipidemia: Secondary | ICD-10-CM | POA: Diagnosis not present

## 2018-07-05 DIAGNOSIS — R82998 Other abnormal findings in urine: Secondary | ICD-10-CM | POA: Diagnosis not present

## 2018-07-07 DIAGNOSIS — F411 Generalized anxiety disorder: Secondary | ICD-10-CM | POA: Diagnosis not present

## 2018-07-08 DIAGNOSIS — R7301 Impaired fasting glucose: Secondary | ICD-10-CM | POA: Diagnosis not present

## 2018-07-08 DIAGNOSIS — H9190 Unspecified hearing loss, unspecified ear: Secondary | ICD-10-CM | POA: Diagnosis not present

## 2018-07-08 DIAGNOSIS — Z0000 Encounter for general adult medical examination without abnormal findings: Secondary | ICD-10-CM | POA: Diagnosis not present

## 2018-07-08 DIAGNOSIS — M79676 Pain in unspecified toe(s): Secondary | ICD-10-CM | POA: Diagnosis not present

## 2018-07-08 DIAGNOSIS — E7849 Other hyperlipidemia: Secondary | ICD-10-CM | POA: Diagnosis not present

## 2018-07-08 DIAGNOSIS — F1320 Sedative, hypnotic or anxiolytic dependence, uncomplicated: Secondary | ICD-10-CM | POA: Diagnosis not present

## 2018-07-08 DIAGNOSIS — Z6822 Body mass index (BMI) 22.0-22.9, adult: Secondary | ICD-10-CM | POA: Diagnosis not present

## 2018-07-08 DIAGNOSIS — K219 Gastro-esophageal reflux disease without esophagitis: Secondary | ICD-10-CM | POA: Diagnosis not present

## 2018-07-08 DIAGNOSIS — M8580 Other specified disorders of bone density and structure, unspecified site: Secondary | ICD-10-CM | POA: Diagnosis not present

## 2018-07-08 DIAGNOSIS — D0511 Intraductal carcinoma in situ of right breast: Secondary | ICD-10-CM | POA: Diagnosis not present

## 2018-07-08 DIAGNOSIS — Z1389 Encounter for screening for other disorder: Secondary | ICD-10-CM | POA: Diagnosis not present

## 2018-07-08 DIAGNOSIS — J309 Allergic rhinitis, unspecified: Secondary | ICD-10-CM | POA: Diagnosis not present

## 2018-07-09 DIAGNOSIS — M2042 Other hammer toe(s) (acquired), left foot: Secondary | ICD-10-CM | POA: Diagnosis not present

## 2018-07-09 DIAGNOSIS — M7742 Metatarsalgia, left foot: Secondary | ICD-10-CM | POA: Diagnosis not present

## 2018-07-09 DIAGNOSIS — M79672 Pain in left foot: Secondary | ICD-10-CM | POA: Diagnosis not present

## 2018-07-09 DIAGNOSIS — G5762 Lesion of plantar nerve, left lower limb: Secondary | ICD-10-CM | POA: Diagnosis not present

## 2018-07-10 ENCOUNTER — Other Ambulatory Visit: Payer: Self-pay | Admitting: Hematology and Oncology

## 2018-07-16 DIAGNOSIS — Z1212 Encounter for screening for malignant neoplasm of rectum: Secondary | ICD-10-CM | POA: Diagnosis not present

## 2018-07-27 DIAGNOSIS — L989 Disorder of the skin and subcutaneous tissue, unspecified: Secondary | ICD-10-CM | POA: Diagnosis not present

## 2018-07-27 DIAGNOSIS — Z419 Encounter for procedure for purposes other than remedying health state, unspecified: Secondary | ICD-10-CM | POA: Diagnosis not present

## 2018-07-27 DIAGNOSIS — D485 Neoplasm of uncertain behavior of skin: Secondary | ICD-10-CM | POA: Diagnosis not present

## 2018-08-08 ENCOUNTER — Other Ambulatory Visit: Payer: Self-pay | Admitting: Hematology and Oncology

## 2018-08-09 ENCOUNTER — Other Ambulatory Visit: Payer: Self-pay

## 2018-08-09 MED ORDER — TAMOXIFEN CITRATE 20 MG PO TABS: 20.0000 mg | ORAL_TABLET | Freq: Every day | ORAL | 3 refills | Status: DC

## 2018-08-11 ENCOUNTER — Ambulatory Visit (HOSPITAL_COMMUNITY)
Admission: RE | Admit: 2018-08-11 | Discharge: 2018-08-11 | Disposition: A | Discharge status: Home / Self Care | Payer: PPO | Source: Ambulatory Visit | Attending: Hematology and Oncology | Admitting: Hematology and Oncology

## 2018-08-11 DIAGNOSIS — Z1231 Encounter for screening mammogram for malignant neoplasm of breast: Secondary | ICD-10-CM | POA: Diagnosis not present

## 2018-08-11 DIAGNOSIS — Z853 Personal history of malignant neoplasm of breast: Secondary | ICD-10-CM | POA: Diagnosis not present

## 2018-08-11 LAB — POCT I-STAT CREATININE: Creatinine, Ser: 1.1 mg/dL — ABNORMAL HIGH (ref 0.44–1.00)

## 2018-08-11 MED ORDER — GADOBUTROL 1 MMOL/ML IV SOLN
7.0000 mL | Freq: Once | INTRAVENOUS | Status: AC | PRN
  Administered 2018-08-11: 7 mL via INTRAVENOUS

## 2018-08-18 ENCOUNTER — Encounter: Payer: Self-pay | Admitting: Hematology and Oncology

## 2018-09-02 ENCOUNTER — Encounter: Payer: Self-pay | Admitting: Hematology and Oncology

## 2018-09-06 DIAGNOSIS — M7742 Metatarsalgia, left foot: Secondary | ICD-10-CM | POA: Diagnosis not present

## 2018-09-06 DIAGNOSIS — G5762 Lesion of plantar nerve, left lower limb: Secondary | ICD-10-CM | POA: Diagnosis not present

## 2018-09-06 DIAGNOSIS — M2042 Other hammer toe(s) (acquired), left foot: Secondary | ICD-10-CM | POA: Diagnosis not present

## 2018-09-08 DIAGNOSIS — F411 Generalized anxiety disorder: Secondary | ICD-10-CM | POA: Diagnosis not present

## 2018-09-09 ENCOUNTER — Encounter: Payer: Self-pay | Admitting: Hematology and Oncology

## 2018-09-13 ENCOUNTER — Telehealth: Payer: Self-pay | Admitting: Hematology and Oncology

## 2018-09-13 NOTE — Telephone Encounter (Signed)
Patient called to reschedule  °

## 2018-09-14 ENCOUNTER — Inpatient Hospital Stay: Payer: PPO | Admitting: Hematology and Oncology

## 2018-09-15 DIAGNOSIS — H40053 Ocular hypertension, bilateral: Secondary | ICD-10-CM | POA: Diagnosis not present

## 2018-09-15 DIAGNOSIS — T8522XA Displacement of intraocular lens, initial encounter: Secondary | ICD-10-CM | POA: Diagnosis not present

## 2018-09-15 DIAGNOSIS — H2000 Unspecified acute and subacute iridocyclitis: Secondary | ICD-10-CM | POA: Diagnosis not present

## 2018-09-15 DIAGNOSIS — H5211 Myopia, right eye: Secondary | ICD-10-CM | POA: Diagnosis not present

## 2018-09-16 DIAGNOSIS — Z961 Presence of intraocular lens: Secondary | ICD-10-CM | POA: Diagnosis not present

## 2018-09-16 DIAGNOSIS — H35433 Paving stone degeneration of retina, bilateral: Secondary | ICD-10-CM | POA: Diagnosis not present

## 2018-09-16 DIAGNOSIS — T8522XA Displacement of intraocular lens, initial encounter: Secondary | ICD-10-CM | POA: Diagnosis not present

## 2018-09-16 DIAGNOSIS — H5319 Other subjective visual disturbances: Secondary | ICD-10-CM | POA: Diagnosis not present

## 2018-09-21 ENCOUNTER — Inpatient Hospital Stay: Payer: PPO | Attending: Hematology and Oncology | Admitting: Hematology and Oncology

## 2018-09-21 ENCOUNTER — Telehealth: Payer: Self-pay | Admitting: Hematology and Oncology

## 2018-09-21 VITALS — BP 120/72 | HR 84 | Temp 98.3°F | Resp 15 | Ht 67.0 in | Wt 148.8 lb

## 2018-09-21 DIAGNOSIS — Z170 Estrogen receptor positive status [ER+]: Secondary | ICD-10-CM | POA: Diagnosis not present

## 2018-09-21 DIAGNOSIS — C50311 Malignant neoplasm of lower-inner quadrant of right female breast: Secondary | ICD-10-CM | POA: Diagnosis not present

## 2018-09-21 DIAGNOSIS — N6323 Unspecified lump in the left breast, lower outer quadrant: Secondary | ICD-10-CM | POA: Diagnosis not present

## 2018-09-21 DIAGNOSIS — M8580 Other specified disorders of bone density and structure, unspecified site: Secondary | ICD-10-CM | POA: Diagnosis not present

## 2018-09-21 DIAGNOSIS — F329 Major depressive disorder, single episode, unspecified: Secondary | ICD-10-CM | POA: Insufficient documentation

## 2018-09-21 DIAGNOSIS — Z923 Personal history of irradiation: Secondary | ICD-10-CM | POA: Diagnosis not present

## 2018-09-21 DIAGNOSIS — Z780 Asymptomatic menopausal state: Secondary | ICD-10-CM

## 2018-09-21 DIAGNOSIS — Z79899 Other long term (current) drug therapy: Secondary | ICD-10-CM | POA: Insufficient documentation

## 2018-09-21 DIAGNOSIS — Z79810 Long term (current) use of selective estrogen receptor modulators (SERMs): Secondary | ICD-10-CM | POA: Insufficient documentation

## 2018-09-21 MED ORDER — VENLAFAXINE HCL ER 37.5 MG PO CP24: 37.5000 mg | ORAL_CAPSULE | Freq: Every day | ORAL | 6 refills | Status: DC

## 2018-09-21 NOTE — Progress Notes (Signed)
Patient Care Team: Marton Redwood, MD as PCP - General (Internal Medicine)  DIAGNOSIS:    ICD-10-CM   1. Post-menopausal Z78.0 DG Bone Density  2. Malignant neoplasm of lower-inner quadrant of right breast of female, estrogen receptor positive ([place]) C50.311    Z17.0     SUMMARY OF ONCOLOGIC HISTORY:   Malignant neoplasm of lower-inner quadrant of right female breast ([place])   02/03/2014 Breast MRI    7 X 6X 5 mm right breast abnormality as 2 immediately adjacent 3 mm foci of enhancement    05/19/2014 Initial Diagnosis    Malignant neoplasm of lower-inner quadrant of female breast DCIS    06/13/2014 Surgery    R Lumpectomy DCIS 2 mm size Grade 1 Er 70%, PR 80%, Margins Neg    07/25/2014 - 08/25/2014 Radiation Therapy    Adjuvant radiation therapy    10/18/2014 Procedure    genetic testing was normal no mutations identified    05/07/2017 Surgery    Left lumpectomy: Complex sclerosing lesion with usual ductal hyperplasia, focal atypical lobular hyperplasia, no invasive cancer identified     08/10/2017 -  Anti-estrogen oral therapy    Tamoxifen 20 mg daily    08/11/2018 Breast MRI    Persistent non-mass enhancement within the left LOQ at anterior depth, at the site of patient's earlier MRI-guided biopsy revealing complex sclerosing lesion, atypical lobular hyperplasia and pseudoangiomatous stromal hyperplasia. This non-mass enhancement is not significantly changed in extent compared to the previous MRI of 02/05/2017 (pre biopsy). No evidence of malignancy within right breast.        CHIEF COMPLIANT: Follow-up of right breast cancer on tamoxifen  INTERVAL HISTORY: Linda [name] is a 72 y.o. with above-mentioned history of right breast cancer, who is currently on surveillance. She has been doing well overall. She continues on tamoxifen, with good tolerance. She denies hot flashes.   MRI breast on 08/11/2018 showed: Persistent non-mass enhancement within the left LOQ at anterior depth,  at the site of patient's earlier MRI-guided biopsy revealing complex sclerosing lesion, atypical lobular hyperplasia and pseudoangiomatous stromal hyperplasia. This non-mass enhancement is not significantly changed in extent compared to the previous MRI of 02/05/2017 (pre biopsy). No evidence of malignancy within right breast.     Patient is here to ask our opinion regarding the MRI suggesting surgery for complex sclerosing lesion.  The MRI showed no changes compared to the previous MRI.  She does not have any pain discomfort or lumps or nodules.  REVIEW OF SYSTEMS:   Constitutional: Denies fevers, chills or abnormal weight loss Eyes: Denies blurriness of vision Ears, nose, mouth, throat, and face: Denies mucositis or sore throat Respiratory: Denies cough, dyspnea or wheezes Cardiovascular: Denies palpitation, chest discomfort Gastrointestinal:  Denies nausea, heartburn or change in bowel habits Skin: Denies abnormal skin rashes Lymphatics: Denies new lymphadenopathy or easy bruising Neurological:Denies numbness, tingling or new weaknesses Behavioral/Psych: Mood is stable, no new changes  Extremities: No lower extremity edema Breast: denies any pain or lumps or nodules in either breasts All other systems were reviewed with the patient and are negative.  I have reviewed the past medical history, past surgical history, social history and family history with the patient and they are unchanged from previous note.  ALLERGIES:  is allergic to effexor [venlafaxine]; other; and zoloft [sertraline].  MEDICATIONS:  Current Outpatient Medications  Medication Sig Dispense Refill  . ALPRAZolam (XANAX) 0.5 MG tablet Take 0.5 mg by mouth as needed for anxiety or sleep.    Marland Kitchen  busPIRone (BUSPAR) 5 MG tablet Take 1 tablet by mouth daily.  2  . CALCIUM-VITAMIN D PO Chew 1 tablet by mouth twice a day    . conjugated estrogens (PREMARIN) vaginal cream Place 1 Applicatorful vaginally daily. 42.5 g 12  .  loratadine (CLARITIN) 10 MG tablet Take 10 mg by mouth daily as needed for allergies.    . Multiple Vitamin (MULTIVITAMIN) capsule Take 1 capsule by mouth daily.      Marland Kitchen RENOVA 0.02 % CREA APPLY ON THE SKIN UTD  2  . rosuvastatin (CRESTOR) 10 MG tablet Take 1 tablet (10 mg total) by mouth daily.    . tamoxifen (NOLVADEX) 20 MG tablet Take 1 tablet (20 mg total) by mouth daily. 90 tablet 3  . zolpidem (AMBIEN) 5 MG tablet Take 1.25 mg by mouth at bedtime as needed for sleep (cuts tablwt in quarters).     No current facility-administered medications for this visit.     PHYSICAL EXAMINATION: ECOG PERFORMANCE STATUS: 0 - Asymptomatic  Vitals:   09/21/18 1111  BP: 120/72  Pulse: 84  Resp: 15  Temp: 98.3 F (36.8 C)  SpO2: 98%   Filed Weights   09/21/18 1111  Weight: 148 lb 12.8 oz (67.5 kg)    GENERAL:alert, no distress and comfortable SKIN: skin color, texture, turgor are normal, no rashes or significant lesions EYES: normal, Conjunctiva are pink and non-injected, sclera clear OROPHARYNX:no exudate, no erythema and lips, buccal mucosa, and tongue normal  NECK: supple, thyroid normal size, non-tender, without nodularity LYMPH:  no palpable lymphadenopathy in the cervical, axillary or inguinal LUNGS: clear to auscultation and percussion with normal breathing effort HEART: regular rate & rhythm and no murmurs and no lower extremity edema ABDOMEN:abdomen soft, non-tender and normal bowel sounds MUSCULOSKELETAL:no cyanosis of digits and no clubbing  NEURO: alert & oriented x 3 with fluent speech, no focal motor/sensory deficits EXTREMITIES: No lower extremity edema   LABORATORY DATA:  I have reviewed the data as listed CMP Latest Ref Rng & Units 08/11/2018 06/30/2017 09/10/2014  Glucose 65 - 99 mg/dL - 92 121(H)  BUN 8 - 27 mg/dL - 23 17  Creatinine 0.44 - 1.00 mg/dL 1.10(H) 0.87 0.92  Sodium 134 - 144 mmol/L - 140 136(L)  Potassium 3.5 - 5.2 mmol/L - 4.7 4.0  Chloride 96 - 106  mmol/L - 103 99  CO2 20 - 29 mmol/L - 24 23  Calcium 8.7 - 10.3 mg/dL - 9.6 9.0    Lab Results  Component Value Date   WBC 5.8 09/10/2014   HGB 13.8 09/10/2014   HCT 40.1 09/10/2014   MCV 99.0 09/10/2014   PLT 266 09/10/2014   NEUTROABS 4.1 06/08/2014    ASSESSMENT & PLAN:  Malignant neoplasm of lower-inner quadrant of right female breast Right breast DCIS ER/PR positive diagnosed March 2015 7 mm focus on MRI and alert lumpectomy 06/13/2014 2 mm focus of DCIS negative margins, ER 70%, PR 80% positive status post radiation therapy; patient was offered antiestrogen therapy but she refused to take it initially but now is tolerating it very well  05/07/2017:Left lumpectomy: Complex sclerosing lesion with usual ductal hyperplasia, focal atypical lobular hyperplasia, no invasive cancer identified  Surveillance: 1. Annual mammograms: March 2019; no mammographic evidence of malignancy, breast density category C 2. breast MRIs 08/11/2018: Persistent non-mass enhancement LOQ left breast at the site of CSL, [place], #not significantly changed compared to previous MRI  Current treatment: Tamoxifen 20 mg daily Tamoxifen toxicities: Denies any hot  flashes or myalgias.  Osteopenia: Was previously on Boniva.  Not taking it any further.  Next bone density will be done in April 2020.  Calcium and vitamin D Bone density 10 2015: T score -1.8.   Depression: Because she is on tamoxifen, she cannot take Paxil.  I recommended that she take Effexor 37.5 mg daily.  I sent a new prescription. She will try it and see if she has any side effects.  She will call us if she has any.  Radiology is recommending excision of the CSL.  Patient decided to keep a close watch on this so she will need annual breast MRIs for assessment.  Return to clinic in 6 months after the next mammogram for follow-up.    Orders Placed This Encounter  Procedures  . DG Bone Density    Standing Status:   Future    Standing  Expiration Date:   10/26/2019    Order Specific Question:   Reason for exam:    Answer:   Post menopausal    Order Specific Question:   Preferred imaging location?    Answer:   [facility]   The patient has a good understanding of the overall plan. she agrees with it. she will call with any problems that may develop before the next visit here.  Nicholas Lose, MD 09/21/2018  I, Soijett Blue am acting as scribe for Dr. Nicholas Lose.  I have reviewed the above documentation for accuracy and completeness, and I agree with the above.

## 2018-09-21 NOTE — Assessment & Plan Note (Signed)
Right breast DCIS ER/PR positive diagnosed March 2015 7 mm focus on MRI and alert lumpectomy 06/13/2014 2 mm focus of DCIS negative margins, ER 70%, PR 80% positive status post radiation therapy; patient was offered antiestrogen therapy but she refused and is currently on surveillance  05/07/2017:Left lumpectomy: Complex sclerosing lesion with usual ductal hyperplasia, focal atypical lobular hyperplasia, no invasive cancer identified  Surveillance: 1. Annual mammograms: March 2019; no mammographic evidence of malignancy, breast density category C 2. breast MRIs 08/11/2018: Persistent non-mass enhancement LOQ left breast at the site of CSL, [place], #not significantly changed compared to previous MRI  2. breast exam  09/21/2018: Benign  Patient decided not to take antiestrogen therapy. Osteopenia: On Boniva 1 calcium and vitamin D Bone density 10 2015: T score -1.8. Patient will need a new bone density test  Radiology is recommending excision of the CSL.  Patient decided to keep a close watch on this so she will need annual breast MRIs for assessment.  Return to clinic in 1 year for follow-up

## 2018-09-21 NOTE — Telephone Encounter (Signed)
Gave patient avs and calendar.  Patient wanted to cancel January appt.

## 2018-09-28 DIAGNOSIS — L821 Other seborrheic keratosis: Secondary | ICD-10-CM | POA: Diagnosis not present

## 2018-09-28 DIAGNOSIS — L814 Other melanin hyperpigmentation: Secondary | ICD-10-CM | POA: Diagnosis not present

## 2018-09-28 DIAGNOSIS — L433 Subacute (active) lichen planus: Secondary | ICD-10-CM | POA: Diagnosis not present

## 2018-09-28 DIAGNOSIS — Z85828 Personal history of other malignant neoplasm of skin: Secondary | ICD-10-CM | POA: Diagnosis not present

## 2018-09-29 ENCOUNTER — Encounter (HOSPITAL_COMMUNITY): Payer: Self-pay | Admitting: *Deleted

## 2018-09-29 ENCOUNTER — Other Ambulatory Visit: Payer: Self-pay

## 2018-09-29 NOTE — Progress Notes (Signed)
Pt denies SOB, chest pain, and being under the care of a cardiologist. Pt denies having a cardiac cath. Pt denies having a chest x ray within the last year. Pt denies recent labs. Pt made aware to stop taking Aspirin, vitamins, fish oil and herbal medications. Do not take any NSAIDs ie: Ibuprofen, Advil, Naproxen (aleve), Motrin, BC and Goody Powder. Pt verbalized understanding of all pre-op instructions.

## 2018-09-30 ENCOUNTER — Ambulatory Visit: Payer: Self-pay | Admitting: Ophthalmology

## 2018-09-30 NOTE — H&P (Signed)
Date of examination:  10/01/18  Indication for surgery: dislocated intraocular lens right eye  Pertinent past medical history:  Past Medical History:  Diagnosis Date  . Anxiety   . Arthritis   . Atrophic vaginitis   . Breast cancer ([place])    right (DCIS) s/p lumpectomy 7/15  . Cataract    BILATERAL  . Cervical dysplasia 1975  . DCIS (ductal carcinoma in situ)    RIGHT  . Depression   . Family history of adverse reaction to anesthesia    Pt stated that her mother had difficulty waking up after anesthesia  . Family history of breast cancer   . Family history of colon cancer   . Family history of prostate cancer   . Hx of radiation therapy 08/02/14- 08/24/14   right reast 4256 cGy i 16 sessions, no boost  . Hypercholesteremia   . Insomnia   . Osteopenia 11/2011   T score -1.7 FRAX not calculated due to history of bisphosphonates and current Evista  . Personal history of radiation therapy 2015   right side  . PVC's (premature ventricular contractions)    PMH  . Seasonal allergies   . Skin cancer 2006   basal cell of face    Pertinent ocular history:  Pseudophakia  Pertinent family history:  Family History  Problem Relation Age of Onset  . Breast cancer Mother        DIAGNOSED IN HER 80'S  . Hyperlipidemia Mother   . Osteoporosis Mother   . Breast cancer Sister        DIAGNOSED AT AGE 33  . Hypertension Maternal Grandmother   . Breast cancer Maternal Grandmother        possibly breast cancer; had mastectomy in her 30s-40s, but don't know if it was due to cancer  . Heart disease Maternal Grandfather   . Stroke Maternal Grandfather   . Diabetes Son        TYPE 1 DIABETES  . Colon cancer Paternal Grandfather   . Cancer Paternal Grandfather        colon  . Heart failure Son   . Kidney cancer Paternal Grandmother        bladder/kidney cancer  . Prostate cancer Maternal Uncle     General:  Healthy appearing patient in no distress.   Eyes:    Acuity OD 20/60     External: Within normal limits    Anterior segment: dislocated intraocular lens right eye  Fundus: Normal      Impression: Dislocated intraocular lens right eye  Plan: Reposition intraocular lens right eye.  Royston Cowper

## 2018-09-30 NOTE — H&P (Deleted)
  The note originally documented on this encounter has been moved the the encounter in which it belongs.  

## 2018-10-01 ENCOUNTER — Encounter (HOSPITAL_COMMUNITY): Payer: Self-pay

## 2018-10-01 ENCOUNTER — Ambulatory Visit (HOSPITAL_COMMUNITY): Payer: PPO | Admitting: Certified Registered"

## 2018-10-01 ENCOUNTER — Other Ambulatory Visit: Payer: Self-pay

## 2018-10-01 ENCOUNTER — Ambulatory Visit (HOSPITAL_COMMUNITY)
Admission: RE | Admit: 2018-10-01 | Discharge: 2018-10-01 | Disposition: A | Discharge status: Home / Self Care | Payer: PPO | Source: Ambulatory Visit | Attending: Ophthalmology | Admitting: Ophthalmology

## 2018-10-01 ENCOUNTER — Encounter (HOSPITAL_COMMUNITY)
Admission: RE | Disposition: A | Discharge status: Home / Self Care | Payer: Self-pay | Source: Ambulatory Visit | Attending: Ophthalmology

## 2018-10-01 DIAGNOSIS — H43812 Vitreous degeneration, left eye: Secondary | ICD-10-CM | POA: Diagnosis not present

## 2018-10-01 DIAGNOSIS — F329 Major depressive disorder, single episode, unspecified: Secondary | ICD-10-CM | POA: Diagnosis not present

## 2018-10-01 DIAGNOSIS — T8522XA Displacement of intraocular lens, initial encounter: Secondary | ICD-10-CM | POA: Diagnosis not present

## 2018-10-01 DIAGNOSIS — F419 Anxiety disorder, unspecified: Secondary | ICD-10-CM | POA: Diagnosis not present

## 2018-10-01 DIAGNOSIS — H4301 Vitreous prolapse, right eye: Secondary | ICD-10-CM | POA: Diagnosis not present

## 2018-10-01 DIAGNOSIS — Z87891 Personal history of nicotine dependence: Secondary | ICD-10-CM | POA: Diagnosis not present

## 2018-10-01 DIAGNOSIS — Z79899 Other long term (current) drug therapy: Secondary | ICD-10-CM | POA: Diagnosis not present

## 2018-10-01 DIAGNOSIS — E7800 Pure hypercholesterolemia, unspecified: Secondary | ICD-10-CM | POA: Insufficient documentation

## 2018-10-01 DIAGNOSIS — Z923 Personal history of irradiation: Secondary | ICD-10-CM | POA: Diagnosis not present

## 2018-10-01 DIAGNOSIS — Z853 Personal history of malignant neoplasm of breast: Secondary | ICD-10-CM | POA: Insufficient documentation

## 2018-10-01 DIAGNOSIS — H2710 Unspecified dislocation of lens: Secondary | ICD-10-CM | POA: Diagnosis not present

## 2018-10-01 DIAGNOSIS — M1990 Unspecified osteoarthritis, unspecified site: Secondary | ICD-10-CM | POA: Insufficient documentation

## 2018-10-01 HISTORY — PX: REPOSITION OF LENS: SHX6069

## 2018-10-01 HISTORY — DX: Ventricular premature depolarization: I49.3

## 2018-10-01 HISTORY — DX: Family history of other specified conditions: Z84.89

## 2018-10-01 LAB — COMPREHENSIVE METABOLIC PANEL
ALT: 18 U/L (ref 0–44)
AST: 23 U/L (ref 15–41)
Albumin: 3.6 g/dL (ref 3.5–5.0)
Alkaline Phosphatase: 31 U/L — ABNORMAL LOW (ref 38–126)
Anion gap: 5 (ref 5–15)
BUN: 17 mg/dL (ref 8–23)
CO2: 25 mmol/L (ref 22–32)
Calcium: 8.8 mg/dL — ABNORMAL LOW (ref 8.9–10.3)
Chloride: 110 mmol/L (ref 98–111)
Creatinine, Ser: 0.94 mg/dL (ref 0.44–1.00)
GFR calc Af Amer: >60 mL/min (ref >60)
GFR calc non Af Amer: 59 mL/min — ABNORMAL LOW (ref >60)
Glucose, Bld: 90 mg/dL (ref 70–99)
Potassium: 4.1 mmol/L (ref 3.5–5.1)
Sodium: 140 mmol/L (ref 135–145)
Total Bilirubin: 0.6 mg/dL (ref 0.3–1.2)
Total Protein: 6.1 g/dL — ABNORMAL LOW (ref 6.5–8.1)

## 2018-10-01 LAB — CBC
HCT: 38.8 % (ref 36.0–46.0)
Hemoglobin: 12.3 g/dL (ref 12.0–15.0)
MCH: 31.9 pg (ref 26.0–34.0)
MCHC: 31.7 g/dL (ref 30.0–36.0)
MCV: 100.8 fL — ABNORMAL HIGH (ref 80.0–100.0)
Platelets: 230 10*3/uL (ref 150–400)
RBC: 3.85 MIL/uL — ABNORMAL LOW (ref 3.87–5.11)
RDW: 13 % (ref 11.5–15.5)
WBC: 4.1 10*3/uL (ref 4.0–10.5)
nRBC: 0 % (ref 0.0–0.2)

## 2018-10-01 SURGERY — REPOSITIONING, IOL
Anesthesia: Monitor Anesthesia Care | Site: Eye | Laterality: Right

## 2018-10-01 MED ORDER — BSS IO SOLN
INTRAOCULAR | Status: DC | PRN
  Administered 2018-10-01 (×3): 15 mL

## 2018-10-01 MED ORDER — HYALURONIDASE HUMAN 150 UNIT/ML IJ SOLN
INTRAMUSCULAR | Status: AC
  Filled 2018-10-01: qty 1

## 2018-10-01 MED ORDER — LIDOCAINE HCL 2 % IJ SOLN
INTRAMUSCULAR | Status: AC
  Filled 2018-10-01: qty 20

## 2018-10-01 MED ORDER — FENTANYL CITRATE (PF) 250 MCG/5ML IJ SOLN
INTRAMUSCULAR | Status: DC | PRN
  Administered 2018-10-01: 50 ug via INTRAVENOUS

## 2018-10-01 MED ORDER — OFLOXACIN 0.3 % OP SOLN
1.0000 [drp] | OPHTHALMIC | Status: AC | PRN
  Administered 2018-10-01: 1 [drp] via OPHTHALMIC
  Filled 2018-10-01: qty 5

## 2018-10-01 MED ORDER — TROPICAMIDE 1 % OP SOLN
1.0000 [drp] | OPHTHALMIC | Status: AC | PRN
  Administered 2018-10-01 (×3): 1 [drp] via OPHTHALMIC
  Filled 2018-10-01: qty 15

## 2018-10-01 MED ORDER — ONDANSETRON HCL 4 MG/2ML IJ SOLN
INTRAMUSCULAR | Status: DC | PRN
  Administered 2018-10-01: 4 mg via INTRAVENOUS

## 2018-10-01 MED ORDER — MEPERIDINE HCL 50 MG/ML IJ SOLN: 6.2500 mg | INTRAMUSCULAR | Status: DC | PRN

## 2018-10-01 MED ORDER — HYPROMELLOSE (GONIOSCOPIC) 2.5 % OP SOLN
OPHTHALMIC | Status: AC
  Filled 2018-10-01: qty 15

## 2018-10-01 MED ORDER — BSS IO SOLN
INTRAOCULAR | Status: AC
  Filled 2018-10-01: qty 15

## 2018-10-01 MED ORDER — EPINEPHRINE PF 1 MG/ML IJ SOLN
INTRAOCULAR | Status: DC | PRN
  Administered 2018-10-01: 09:00:00

## 2018-10-01 MED ORDER — ACETAMINOPHEN 160 MG/5ML PO SOLN: 325.0000 mg | ORAL | Status: DC | PRN

## 2018-10-01 MED ORDER — OXYCODONE HCL 5 MG/5ML PO SOLN: 5.0000 mg | Freq: Once | ORAL | Status: DC | PRN

## 2018-10-01 MED ORDER — OFLOXACIN 0.3 % OP SOLN
1.0000 [drp] | OPHTHALMIC | Status: DC
  Administered 2018-10-01 (×2): 1 [drp] via OPHTHALMIC

## 2018-10-01 MED ORDER — TOBRAMYCIN-DEXAMETHASONE 0.3-0.1 % OP OINT
TOPICAL_OINTMENT | OPHTHALMIC | Status: AC
  Filled 2018-10-01: qty 3.5

## 2018-10-01 MED ORDER — TOBRAMYCIN 0.3 % OP OINT
TOPICAL_OINTMENT | OPHTHALMIC | Status: DC | PRN
  Administered 2018-10-01: 1 via OPHTHALMIC

## 2018-10-01 MED ORDER — ATROPINE SULFATE 1 % OP SOLN
OPHTHALMIC | Status: AC
  Filled 2018-10-01: qty 5

## 2018-10-01 MED ORDER — FENTANYL CITRATE (PF) 250 MCG/5ML IJ SOLN
INTRAMUSCULAR | Status: AC
  Filled 2018-10-01: qty 5

## 2018-10-01 MED ORDER — PROPARACAINE HCL 0.5 % OP SOLN
1.0000 [drp] | Freq: Once | OPHTHALMIC | Status: AC
  Administered 2018-10-01: 1 [drp] via OPHTHALMIC
  Filled 2018-10-01: qty 15

## 2018-10-01 MED ORDER — CEFAZOLIN SUBCONJUNCTIVAL INJECTION 100 MG/0.5 ML
100.0000 mg | INJECTION | SUBCONJUNCTIVAL | Status: DC
  Filled 2018-10-01: qty 5

## 2018-10-01 MED ORDER — HYPROMELLOSE (GONIOSCOPIC) 2.5 % OP SOLN
OPHTHALMIC | Status: DC | PRN
  Administered 2018-10-01: 2 [drp] via OPHTHALMIC

## 2018-10-01 MED ORDER — DEXAMETHASONE SODIUM PHOSPHATE 10 MG/ML IJ SOLN
INTRAMUSCULAR | Status: AC
  Filled 2018-10-01: qty 1

## 2018-10-01 MED ORDER — FENTANYL CITRATE (PF) 100 MCG/2ML IJ SOLN: 25.0000 ug | INTRAMUSCULAR | Status: DC | PRN

## 2018-10-01 MED ORDER — BSS PLUS IO SOLN
INTRAOCULAR | Status: AC
  Filled 2018-10-01: qty 500

## 2018-10-01 MED ORDER — OXYCODONE HCL 5 MG PO TABS: 5.0000 mg | ORAL_TABLET | Freq: Once | ORAL | Status: DC | PRN

## 2018-10-01 MED ORDER — PROPOFOL 10 MG/ML IV BOLUS
INTRAVENOUS | Status: AC
  Filled 2018-10-01: qty 20

## 2018-10-01 MED ORDER — ACETYLCHOLINE CHLORIDE 20 MG IO SOLR
INTRAOCULAR | Status: AC
  Filled 2018-10-01: qty 1

## 2018-10-01 MED ORDER — MIDAZOLAM HCL 2 MG/2ML IJ SOLN
INTRAMUSCULAR | Status: AC
  Filled 2018-10-01: qty 2

## 2018-10-01 MED ORDER — ACETAMINOPHEN 325 MG PO TABS: 325.0000 mg | ORAL_TABLET | ORAL | Status: DC | PRN

## 2018-10-01 MED ORDER — NA CHONDROIT SULF-NA HYALURON 40-30 MG/ML IO SOLN
INTRAOCULAR | Status: AC
  Filled 2018-10-01: qty 0.5

## 2018-10-01 MED ORDER — ONDANSETRON HCL 4 MG/2ML IJ SOLN: 4.0000 mg | Freq: Once | INTRAMUSCULAR | Status: DC | PRN

## 2018-10-01 MED ORDER — PHENYLEPHRINE HCL 2.5 % OP SOLN
1.0000 [drp] | OPHTHALMIC | Status: AC | PRN
  Administered 2018-10-01 (×3): 1 [drp] via OPHTHALMIC
  Filled 2018-10-01: qty 2

## 2018-10-01 MED ORDER — CYCLOPENTOLATE HCL 1 % OP SOLN
1.0000 [drp] | OPHTHALMIC | Status: AC | PRN
  Administered 2018-10-01 (×3): 1 [drp] via OPHTHALMIC
  Filled 2018-10-01: qty 2

## 2018-10-01 MED ORDER — BUPIVACAINE HCL (PF) 0.75 % IJ SOLN
INTRAMUSCULAR | Status: AC
  Filled 2018-10-01: qty 10

## 2018-10-01 MED ORDER — EPINEPHRINE PF 1 MG/ML IJ SOLN
INTRAMUSCULAR | Status: AC
  Filled 2018-10-01: qty 1

## 2018-10-01 MED ORDER — PROPARACAINE HCL 0.5 % OP SOLN
1.0000 [drp] | OPHTHALMIC | Status: DC
  Administered 2018-10-01 (×2): 1 [drp] via OPHTHALMIC

## 2018-10-01 MED ORDER — PROPOFOL 500 MG/50ML IV EMUL
INTRAVENOUS | Status: DC | PRN
  Administered 2018-10-01: 100 ug/kg/min via INTRAVENOUS

## 2018-10-01 MED ORDER — ACETYLCHOLINE CHLORIDE 20 MG IO SOLR
INTRAOCULAR | Status: DC | PRN
  Administered 2018-10-01: 20 mg via INTRAOCULAR

## 2018-10-01 MED ORDER — MIDAZOLAM HCL 2 MG/2ML IJ SOLN
INTRAMUSCULAR | Status: DC | PRN
  Administered 2018-10-01 (×2): 1 mg via INTRAVENOUS

## 2018-10-01 MED ORDER — PROPOFOL 10 MG/ML IV BOLUS
INTRAVENOUS | Status: DC | PRN
  Administered 2018-10-01: 30 mg via INTRAVENOUS

## 2018-10-01 MED ORDER — DEXAMETHASONE SODIUM PHOSPHATE 10 MG/ML IJ SOLN
INTRAMUSCULAR | Status: DC | PRN
  Administered 2018-10-01: 10 mg

## 2018-10-01 MED ORDER — CEFAZOLIN SUBCONJUNCTIVAL INJECTION 100 MG/0.5 ML
INJECTION | SUBCONJUNCTIVAL | Status: DC | PRN
  Administered 2018-10-01: 100 mg via SUBCONJUNCTIVAL

## 2018-10-01 MED ORDER — NA CHONDROIT SULF-NA HYALURON 40-30 MG/ML IO SOLN
INTRAOCULAR | Status: DC | PRN
  Administered 2018-10-01: 0.5 mL via INTRAOCULAR

## 2018-10-01 MED ORDER — LIDOCAINE HCL 2 % IJ SOLN
INTRAMUSCULAR | Status: DC | PRN
  Administered 2018-10-01: 5 mL via RETROBULBAR

## 2018-10-01 MED ORDER — LACTATED RINGERS IV SOLN
INTRAVENOUS | Status: DC | PRN
  Administered 2018-10-01: 07:00:00 via INTRAVENOUS

## 2018-10-01 SURGICAL SUPPLY — 36 items
APL SRG 3 HI ABS STRL LF PLS (MISCELLANEOUS) ×1
APL SWBSTK 6 STRL LF DISP (MISCELLANEOUS) ×2
APPLICATOR COTTON TIP 6 STRL (MISCELLANEOUS) IMPLANT
APPLICATOR COTTON TIP 6IN STRL (MISCELLANEOUS) ×4
APPLICATOR DR MATTHEWS STRL (MISCELLANEOUS) ×1 IMPLANT
BLADE EYE CATARACT 19 1.4 BEAV (BLADE) IMPLANT
BLADE MVR KNIFE 20G (BLADE) ×1 IMPLANT
BLADE SUPER 15 ALCON (BLADE) ×1 IMPLANT
CANNULA ANT/CHMB 27G (MISCELLANEOUS) IMPLANT
CANNULA ANT/CHMB 27GA (MISCELLANEOUS) ×2 IMPLANT
CAUTERY EYE LOW TEMP 1300F FIN (OPHTHALMIC RELATED) IMPLANT
COVER SURGICAL LIGHT HANDLE (MISCELLANEOUS) ×2 IMPLANT
FORCEPS ECKARDT ILM 25G SERR (OPHTHALMIC RELATED) IMPLANT
GLOVE BIOGEL PI IND STRL 7.0 (GLOVE) IMPLANT
GLOVE BIOGEL PI INDICATOR 7.0 (GLOVE) ×2
GLOVE ECLIPSE 7.5 STRL STRAW (GLOVE) ×2 IMPLANT
GLOVE SURG SS PI 6.0 STRL IVOR (GLOVE) ×1 IMPLANT
GOWN STRL REUS W/ TWL LRG LVL3 (GOWN DISPOSABLE) ×1 IMPLANT
GOWN STRL REUS W/TWL LRG LVL3 (GOWN DISPOSABLE) ×4
KIT BASIN OR (CUSTOM PROCEDURE TRAY) ×2 IMPLANT
NDL 18GX1X1/2 (RX/OR ONLY) (NEEDLE) IMPLANT
NDL RETROBULBAR 25GX1.5 (NEEDLE) IMPLANT
NEEDLE 18GX1X1/2 (RX/OR ONLY) (NEEDLE) ×2 IMPLANT
NEEDLE RETROBULBAR 25GX1.5 (NEEDLE) ×2 IMPLANT
NS IRRIG 1000ML POUR BTL (IV SOLUTION) ×2 IMPLANT
PAD ARMBOARD 7.5X6 YLW CONV (MISCELLANEOUS) ×4 IMPLANT
SPEAR EYE SURG WECK-CEL (MISCELLANEOUS) ×1 IMPLANT
SPECIMEN JAR SMALL (MISCELLANEOUS) IMPLANT
SUT CHROMIC 7 0 TG140 8 (SUTURE) ×1 IMPLANT
SUT ETHILON 10 0 CS140 6 (SUTURE) ×2 IMPLANT
SUT ETHILON 9 0 TG140 8 (SUTURE) ×1 IMPLANT
SUT GORETEX CV 8 TT 9 (SUTURE) ×1 IMPLANT
SUT VICRYL 8 0 TG140 8 (SUTURE) ×1 IMPLANT
SYR 3ML LL SCALE MARK (SYRINGE) ×1 IMPLANT
TOWEL GREEN STERILE FF (TOWEL DISPOSABLE) ×1 IMPLANT
WATER STERILE IRR 1000ML POUR (IV SOLUTION) ×2 IMPLANT

## 2018-10-01 NOTE — Discharge Instructions (Addendum)
Sleep with head elevated  Keep Eye Shield on, clean, dry, and intact until follow-up visit.  See Dr. Posey Pronto in the office tomorrow.

## 2018-10-01 NOTE — Anesthesia Procedure Notes (Signed)
Procedure Name: MAC Date/Time: 10/01/2018 7:53 AM Performed by: Imagene Riches, CRNA Pre-anesthesia Checklist: Patient identified, Emergency Drugs available, Suction available, Patient being monitored and Timeout performed Patient Re-evaluated:Patient Re-evaluated prior to induction Oxygen Delivery Method: Nasal cannula

## 2018-10-01 NOTE — Transfer of Care (Signed)
Immediate Anesthesia Transfer of Care Note  Patient: Linda [name]  Procedure(s) Performed: REPOSITION OF LENS RIGHT EYE (Right Eye)  Patient Location: PACU  Anesthesia Type:MAC  Level of Consciousness: awake, alert  and oriented  Airway & Oxygen Therapy: Patient Spontanous Breathing  Post-op Assessment: Report given to RN and Post -op Vital signs reviewed and stable  Post vital signs: Reviewed and stable  Last Vitals:  Vitals Value Taken Time  BP    Temp    Pulse    Resp    SpO2      Last Pain:  Vitals:   10/01/18 0613  TempSrc:   PainSc: 0-No pain         Complications: No apparent anesthesia complications

## 2018-10-01 NOTE — Anesthesia Preprocedure Evaluation (Signed)
Anesthesia Evaluation  Patient identified by MRN, date of birth, ID band Patient awake    Reviewed: Allergy & Precautions, NPO status , Patient's Chart, lab work & pertinent test results  Airway Mallampati: I       Dental no notable dental hx. (+) Teeth Intact   Pulmonary former smoker,    Pulmonary exam normal breath sounds clear to auscultation       Cardiovascular negative cardio ROS Normal cardiovascular exam Rhythm:Regular Rate:Normal     Neuro/Psych PSYCHIATRIC DISORDERS Anxiety    GI/Hepatic negative GI ROS, Neg liver ROS,   Endo/Other  negative endocrine ROS  Renal/GU negative Renal ROS     Musculoskeletal   Abdominal Normal abdominal exam  (+)   Peds  Hematology   Anesthesia Other Findings   Reproductive/Obstetrics                             Anesthesia Physical Anesthesia Plan  ASA: II  Anesthesia Plan: MAC   Post-op Pain Management:    Induction:   PONV Risk Score and Plan: 2 and Ondansetron  Airway Management Planned: Natural Airway and Nasal Cannula  Additional Equipment:   Intra-op Plan:   Post-operative Plan:   Informed Consent: I have reviewed the patients History and Physical, chart, labs and discussed the procedure including the risks, benefits and alternatives for the proposed anesthesia with the patient or authorized representative who has indicated his/her understanding and acceptance.     Plan Discussed with: CRNA  Anesthesia Plan Comments:         Anesthesia Quick Evaluation

## 2018-10-01 NOTE — Brief Op Note (Signed)
10/01/2018  9:02 AM  PATIENT:  Linda [name]  72 y.o. female  PRE-OPERATIVE DIAGNOSIS:  dislocated lens right eye  POST-OPERATIVE DIAGNOSIS:  dislocated lens right eye  PROCEDURE:  Procedure(s): REPOSITION OF LENS RIGHT EYE (Right)  SURGEON:  Surgeon(s) and Role:    * Jalene Mullet, MD - Primary  PHYSICIAN ASSISTANT:   ASSISTANTS: none   ANESTHESIA:   local and MAC  EBL:  minimal   BLOOD ADMINISTERED:none  DRAINS: none   LOCAL MEDICATIONS USED:  MARCAINE    and LIDOCAINE   SPECIMEN:  No Specimen  DISPOSITION OF SPECIMEN:  N/A  COUNTS:  YES  TOURNIQUET:  * No tourniquets in log *  DICTATION: .Note written in EPIC  PLAN OF CARE: Discharge to home after PACU  PATIENT DISPOSITION:  PACU - hemodynamically stable.   Delay start of Pharmacological VTE agent (>24hrs) due to surgical blood loss or risk of bleeding: not applicable

## 2018-10-04 ENCOUNTER — Encounter (HOSPITAL_COMMUNITY): Payer: Self-pay | Admitting: Ophthalmology

## 2018-10-04 NOTE — Anesthesia Postprocedure Evaluation (Signed)
Anesthesia Post Note  Patient: Linda [name]  Procedure(s) Performed: REPOSITION OF LENS RIGHT EYE (Right Eye)     Anesthesia Type: MAC Level of consciousness: awake Pain management: pain level controlled Vital Signs Assessment: post-procedure vital signs reviewed and stable Respiratory status: spontaneous breathing Cardiovascular status: stable Postop Assessment: no apparent nausea or vomiting Anesthetic complications: no    Last Vitals:  Vitals:   10/01/18 0909 10/01/18 0924  BP: 114/75 112/66  Pulse: 80 74  Resp: 16 17  Temp: 36.4 C 36.4 C  SpO2: 98% 97%    Last Pain:  Vitals:   10/01/18 0924  TempSrc:   PainSc: 0-No pain   Pain Goal:                 Huston Foley

## 2018-10-06 DIAGNOSIS — R300 Dysuria: Secondary | ICD-10-CM | POA: Diagnosis not present

## 2018-10-13 NOTE — Op Note (Signed)
Linda [name] 10/13/2018 Diagnosis: Dislocated intraocular lens right eye and displacement of vitreous in anterior chamber  Procedure: reposition of intraocular lens and sutured IOL right eye and anterior vitrectomy Operative Eye:  right eye  Surgeon: Royston Cowper Estimated Blood Loss: minimal Specimens for Pathology:  None Complications: none   Retrobulbar anesthesia was attained after time-out confirmed the correct operative eye.  The  patient was prepped and draped in the usual fashion for ocular surgery on the right eye .  A lid speculum was placed.  Two paracentesis wounds were made to allow for placement of the Goretex suture in the superonasal quadrant.  The IOL was manipulated to ensure the superior haptic was placed in the sulcus.  A localized peritomy was placed in the superotemporal quadrant and two sclerotomies were placed 4 mm apart 71mm posterior to the limbus.  Gortex suture was fed through the sclerotomies and around the superior haptic to ensure capture with the aid of serrated forceps.  The suture was tied in place and buried bringing the IOL past the midline.  There still appeared to be some risk of IOL decentration give that PMMA make up of the IOL.  Anterior vitrectomy was performed and any vitreous to the wound was removed as well as vitreous that would tent the iris.  Miochol was injected in the eye.  The conjunctiva was reapproximated with 8-0 Vicryl.  The anterior chamber was filled with BSS and the paracentesis wounds hydrated.  The speculum and drapes were removed and the eye was patched with Polymixin/Bacitracin ophthalmic ointment. An eye shield was placed and the patient was transferred alert and conversant with stable vital signs to the post operative recovery area.  The patient tolerated the procedure well and no complications were noted.  Royston Cowper MD

## 2018-10-18 DIAGNOSIS — G5762 Lesion of plantar nerve, left lower limb: Secondary | ICD-10-CM | POA: Diagnosis not present

## 2018-10-18 DIAGNOSIS — M7742 Metatarsalgia, left foot: Secondary | ICD-10-CM | POA: Diagnosis not present

## 2018-10-18 DIAGNOSIS — M2042 Other hammer toe(s) (acquired), left foot: Secondary | ICD-10-CM | POA: Diagnosis not present

## 2018-11-02 DIAGNOSIS — Z9889 Other specified postprocedural states: Secondary | ICD-10-CM | POA: Diagnosis not present

## 2018-11-02 DIAGNOSIS — T8522XA Displacement of intraocular lens, initial encounter: Secondary | ICD-10-CM | POA: Diagnosis not present

## 2018-11-02 DIAGNOSIS — H2701 Aphakia, right eye: Secondary | ICD-10-CM | POA: Diagnosis not present

## 2018-11-02 DIAGNOSIS — H43311 Vitreous membranes and strands, right eye: Secondary | ICD-10-CM | POA: Diagnosis not present

## 2018-11-02 DIAGNOSIS — T8522XS Displacement of intraocular lens, sequela: Secondary | ICD-10-CM | POA: Diagnosis not present

## 2018-11-09 DIAGNOSIS — F411 Generalized anxiety disorder: Secondary | ICD-10-CM | POA: Diagnosis not present

## 2018-11-19 DIAGNOSIS — H35351 Cystoid macular degeneration, right eye: Secondary | ICD-10-CM | POA: Diagnosis not present

## 2018-11-26 ENCOUNTER — Other Ambulatory Visit: Payer: Self-pay | Admitting: Hematology and Oncology

## 2018-11-26 DIAGNOSIS — E2839 Other primary ovarian failure: Secondary | ICD-10-CM

## 2018-11-30 ENCOUNTER — Ambulatory Visit: Payer: PPO | Admitting: Hematology and Oncology

## 2018-12-06 DIAGNOSIS — H40059 Ocular hypertension, unspecified eye: Secondary | ICD-10-CM | POA: Diagnosis not present

## 2018-12-06 DIAGNOSIS — H40053 Ocular hypertension, bilateral: Secondary | ICD-10-CM | POA: Diagnosis not present

## 2018-12-06 DIAGNOSIS — H04123 Dry eye syndrome of bilateral lacrimal glands: Secondary | ICD-10-CM | POA: Diagnosis not present

## 2018-12-06 DIAGNOSIS — B0052 Herpesviral keratitis: Secondary | ICD-10-CM | POA: Diagnosis not present

## 2018-12-06 DIAGNOSIS — H16109 Unspecified superficial keratitis, unspecified eye: Secondary | ICD-10-CM | POA: Diagnosis not present

## 2018-12-06 DIAGNOSIS — H16101 Unspecified superficial keratitis, right eye: Secondary | ICD-10-CM | POA: Diagnosis not present

## 2018-12-08 DIAGNOSIS — G5762 Lesion of plantar nerve, left lower limb: Secondary | ICD-10-CM | POA: Diagnosis not present

## 2018-12-08 DIAGNOSIS — M7742 Metatarsalgia, left foot: Secondary | ICD-10-CM | POA: Diagnosis not present

## 2018-12-09 DIAGNOSIS — H903 Sensorineural hearing loss, bilateral: Secondary | ICD-10-CM | POA: Diagnosis not present

## 2018-12-17 DIAGNOSIS — M7742 Metatarsalgia, left foot: Secondary | ICD-10-CM | POA: Diagnosis not present

## 2018-12-17 DIAGNOSIS — G5762 Lesion of plantar nerve, left lower limb: Secondary | ICD-10-CM | POA: Diagnosis not present

## 2018-12-24 ENCOUNTER — Other Ambulatory Visit: Payer: Self-pay | Admitting: Hematology and Oncology

## 2018-12-24 DIAGNOSIS — Z853 Personal history of malignant neoplasm of breast: Secondary | ICD-10-CM

## 2019-01-04 DIAGNOSIS — H35351 Cystoid macular degeneration, right eye: Secondary | ICD-10-CM | POA: Diagnosis not present

## 2019-01-04 DIAGNOSIS — H35371 Puckering of macula, right eye: Secondary | ICD-10-CM | POA: Diagnosis not present

## 2019-01-07 DIAGNOSIS — H6983 Other specified disorders of Eustachian tube, bilateral: Secondary | ICD-10-CM | POA: Diagnosis not present

## 2019-01-07 DIAGNOSIS — H903 Sensorineural hearing loss, bilateral: Secondary | ICD-10-CM | POA: Diagnosis not present

## 2019-01-12 ENCOUNTER — Other Ambulatory Visit: Payer: Self-pay | Admitting: Otolaryngology

## 2019-01-12 DIAGNOSIS — H918X9 Other specified hearing loss, unspecified ear: Secondary | ICD-10-CM

## 2019-01-12 DIAGNOSIS — IMO0001 Reserved for inherently not codable concepts without codable children: Secondary | ICD-10-CM

## 2019-01-12 DIAGNOSIS — H9042 Sensorineural hearing loss, unilateral, left ear, with unrestricted hearing on the contralateral side: Secondary | ICD-10-CM

## 2019-01-23 DIAGNOSIS — M25552 Pain in left hip: Secondary | ICD-10-CM | POA: Diagnosis not present

## 2019-01-23 DIAGNOSIS — M545 Low back pain: Secondary | ICD-10-CM | POA: Diagnosis not present

## 2019-01-24 DIAGNOSIS — Z961 Presence of intraocular lens: Secondary | ICD-10-CM | POA: Diagnosis not present

## 2019-01-24 DIAGNOSIS — H16101 Unspecified superficial keratitis, right eye: Secondary | ICD-10-CM | POA: Diagnosis not present

## 2019-01-24 DIAGNOSIS — H40053 Ocular hypertension, bilateral: Secondary | ICD-10-CM | POA: Diagnosis not present

## 2019-01-26 ENCOUNTER — Ambulatory Visit
Admission: RE | Admit: 2019-01-26 | Discharge: 2019-01-26 | Disposition: A | Discharge status: Home / Self Care | Payer: PPO | Source: Ambulatory Visit | Attending: Otolaryngology | Admitting: Otolaryngology

## 2019-01-26 DIAGNOSIS — H9042 Sensorineural hearing loss, unilateral, left ear, with unrestricted hearing on the contralateral side: Secondary | ICD-10-CM | POA: Diagnosis not present

## 2019-01-26 DIAGNOSIS — IMO0001 Reserved for inherently not codable concepts without codable children: Secondary | ICD-10-CM

## 2019-01-26 MED ORDER — GADOBENATE DIMEGLUMINE 529 MG/ML IV SOLN
7.0000 mL | Freq: Once | INTRAVENOUS | Status: AC | PRN
  Administered 2019-01-26: 7 mL via INTRAVENOUS

## 2019-02-07 ENCOUNTER — Other Ambulatory Visit: Payer: Self-pay

## 2019-02-07 ENCOUNTER — Other Ambulatory Visit: Payer: Self-pay | Admitting: Hematology and Oncology

## 2019-02-07 ENCOUNTER — Ambulatory Visit
Admission: RE | Admit: 2019-02-07 | Discharge: 2019-02-07 | Disposition: A | Discharge status: Home / Self Care | Payer: PPO | Source: Ambulatory Visit | Attending: Hematology and Oncology | Admitting: Hematology and Oncology

## 2019-02-07 DIAGNOSIS — Z853 Personal history of malignant neoplasm of breast: Secondary | ICD-10-CM

## 2019-02-07 DIAGNOSIS — N6002 Solitary cyst of left breast: Secondary | ICD-10-CM | POA: Diagnosis not present

## 2019-02-07 DIAGNOSIS — R928 Other abnormal and inconclusive findings on diagnostic imaging of breast: Secondary | ICD-10-CM | POA: Diagnosis not present

## 2019-02-08 ENCOUNTER — Ambulatory Visit: Payer: PPO | Admitting: Physician Assistant

## 2019-02-14 ENCOUNTER — Ambulatory Visit: Payer: PPO | Admitting: Internal Medicine

## 2019-03-14 ENCOUNTER — Other Ambulatory Visit: Payer: PPO

## 2019-03-24 ENCOUNTER — Ambulatory Visit: Payer: PPO | Admitting: Hematology and Oncology

## 2019-03-31 DIAGNOSIS — R198 Other specified symptoms and signs involving the digestive system and abdomen: Secondary | ICD-10-CM | POA: Diagnosis not present

## 2019-03-31 DIAGNOSIS — Z86010 Personal history of colonic polyps: Secondary | ICD-10-CM | POA: Diagnosis not present

## 2019-03-31 DIAGNOSIS — Z800 Family history of malignant neoplasm of digestive organs: Secondary | ICD-10-CM | POA: Diagnosis not present

## 2019-04-11 ENCOUNTER — Other Ambulatory Visit: Payer: PPO

## 2019-04-28 ENCOUNTER — Encounter: Payer: Self-pay | Admitting: Hematology and Oncology

## 2019-04-28 DIAGNOSIS — F411 Generalized anxiety disorder: Secondary | ICD-10-CM | POA: Diagnosis not present

## 2019-05-10 DIAGNOSIS — K6389 Other specified diseases of intestine: Secondary | ICD-10-CM | POA: Diagnosis not present

## 2019-05-10 DIAGNOSIS — K5730 Diverticulosis of large intestine without perforation or abscess without bleeding: Secondary | ICD-10-CM | POA: Diagnosis not present

## 2019-05-10 DIAGNOSIS — K633 Ulcer of intestine: Secondary | ICD-10-CM | POA: Diagnosis not present

## 2019-05-10 DIAGNOSIS — Z86010 Personal history of colonic polyps: Secondary | ICD-10-CM | POA: Diagnosis not present

## 2019-05-10 DIAGNOSIS — D126 Benign neoplasm of colon, unspecified: Secondary | ICD-10-CM | POA: Diagnosis not present

## 2019-05-13 ENCOUNTER — Ambulatory Visit: Payer: PPO | Admitting: Hematology and Oncology

## 2019-05-13 DIAGNOSIS — D126 Benign neoplasm of colon, unspecified: Secondary | ICD-10-CM | POA: Diagnosis not present

## 2019-05-17 DIAGNOSIS — H5319 Other subjective visual disturbances: Secondary | ICD-10-CM | POA: Diagnosis not present

## 2019-05-17 DIAGNOSIS — H11431 Conjunctival hyperemia, right eye: Secondary | ICD-10-CM | POA: Diagnosis not present

## 2019-05-17 DIAGNOSIS — H04123 Dry eye syndrome of bilateral lacrimal glands: Secondary | ICD-10-CM | POA: Diagnosis not present

## 2019-05-17 DIAGNOSIS — H35351 Cystoid macular degeneration, right eye: Secondary | ICD-10-CM | POA: Diagnosis not present

## 2019-05-17 DIAGNOSIS — H35371 Puckering of macula, right eye: Secondary | ICD-10-CM | POA: Diagnosis not present

## 2019-05-17 DIAGNOSIS — Z961 Presence of intraocular lens: Secondary | ICD-10-CM | POA: Diagnosis not present

## 2019-05-17 DIAGNOSIS — H11241 Scarring of conjunctiva, right eye: Secondary | ICD-10-CM | POA: Diagnosis not present

## 2019-05-30 ENCOUNTER — Telehealth: Payer: Self-pay | Admitting: Hematology and Oncology

## 2019-05-30 NOTE — Assessment & Plan Note (Signed)
Right breast DCIS ER/PR positive diagnosed March 2015 7 mm focus on MRI and alert lumpectomy 06/13/2014 2 mm focus of DCIS negative margins, ER 70%, PR 80% positive status post radiation therapy; patient was offered antiestrogen therapy but she refused to take it initially but now is tolerating it very well  05/07/2017:Left lumpectomy: Complex sclerosing lesion with usual ductal hyperplasia, focal atypical lobular hyperplasia, no invasive cancer identified  Surveillance: 1. Annual mammograms: [phone]; no mammographic evidence of malignancy, breast density category C 2.breast MRIs 08/11/2018: Persistent non-mass enhancement LOQ left breast at the site of CSL, [place], #not significantly changed compared to previous MRI  Current treatment: Tamoxifen 20 mg daily Tamoxifen toxicities: Denies any hot flashes or myalgias.  Osteopenia: Was previously on Boniva.  Not taking it any further.  Next bone density will be done in April 2020.  Calcium and vitamin D Bone density 10 2015: T score -1.8.   Depression: Because she is on tamoxifen, she cannot take Paxil.  I recommended that she take Effexor 37.5 mg daily.  I sent a new prescription. She will try it and see if she has any side effects.  She will call us if she has any.  November 2019: Dislocated intraocular lens surgery  Radiology had recommended excision of the CSL.  Patient decided to keep a close watch on this so she will need annual breast MRIs for assessment.  Return to clinic in 6 months after the next breast MRI for follow-up.

## 2019-05-30 NOTE — Telephone Encounter (Signed)
I talk with patient regarding video visit °

## 2019-05-31 ENCOUNTER — Other Ambulatory Visit: Payer: Self-pay

## 2019-05-31 ENCOUNTER — Ambulatory Visit
Admission: RE | Admit: 2019-05-31 | Discharge: 2019-05-31 | Disposition: A | Discharge status: Home / Self Care | Payer: PPO | Source: Ambulatory Visit | Attending: Hematology and Oncology | Admitting: Hematology and Oncology

## 2019-05-31 DIAGNOSIS — M8589 Other specified disorders of bone density and structure, multiple sites: Secondary | ICD-10-CM | POA: Diagnosis not present

## 2019-05-31 DIAGNOSIS — Z780 Asymptomatic menopausal state: Secondary | ICD-10-CM | POA: Diagnosis not present

## 2019-05-31 DIAGNOSIS — E2839 Other primary ovarian failure: Secondary | ICD-10-CM

## 2019-06-01 ENCOUNTER — Telehealth: Payer: Self-pay | Admitting: Hematology and Oncology

## 2019-06-01 NOTE — Telephone Encounter (Signed)
Confirmed appt/verified info °

## 2019-06-01 NOTE — Progress Notes (Signed)
HEMATOLOGY-ONCOLOGY MYCHART VIDEO VISIT PROGRESS NOTE  I connected with Linda [name] on 06/02/2019 at  2:45 PM EDT by MyChart video conference and verified that I am speaking with the correct person using two identifiers.  I discussed the limitations, risks, security and privacy concerns of performing an evaluation and management service by MyChart and the availability of in person appointments.  I also discussed with the patient that there may be a patient responsible charge related to this service. The patient expressed understanding and agreed to proceed.  Patient's Location: Home Physician Location: Clinic  CHIEF COMPLIANT: Follow-up of right breast cancer on tamoxifen  INTERVAL HISTORY: Linda [name] is a 73 y.o. female with above-mentioned history of right breast cancer treated with lumpectomy, radiation, and who is currently on tamoxifen. I last saw her 10 month ago. Mammogram and [place] on 02/07/19 showed no evidence of malignancy bilaterally. Bone density scan on 05/31/19 showed a T-score of -1.9. She presents over MyChart today for follow-up.   Oncology History  Malignant neoplasm of lower-inner quadrant of right female breast ([place])  02/03/2014 Breast MRI   7 X 6X 5 mm right breast abnormality as 2 immediately adjacent 3 mm foci of enhancement   05/19/2014 Initial Diagnosis   Malignant neoplasm of lower-inner quadrant of female breast DCIS   06/13/2014 Surgery   R Lumpectomy DCIS 2 mm size Grade 1 Er 70%, PR 80%, Margins Neg   07/25/2014 - 08/25/2014 Radiation Therapy   Adjuvant radiation therapy   10/18/2014 Procedure   genetic testing was normal no mutations identified   05/07/2017 Surgery   Left lumpectomy: Complex sclerosing lesion with usual ductal hyperplasia, focal atypical lobular hyperplasia, no invasive cancer identified    08/10/2017 -  Anti-estrogen oral therapy   Tamoxifen 20 mg daily   08/11/2018 Breast MRI   Persistent non-mass enhancement within the left LOQ  at anterior depth, at the site of patient's earlier MRI-guided biopsy revealing complex sclerosing lesion, atypical lobular hyperplasia and pseudoangiomatous stromal hyperplasia. This non-mass enhancement is not significantly changed in extent compared to the previous MRI of 02/05/2017 (pre biopsy). No evidence of malignancy within right breast.        REVIEW OF SYSTEMS:   Constitutional: Denies fevers, chills or abnormal weight loss Eyes: Denies blurriness of vision Ears, nose, mouth, throat, and face: Denies mucositis or sore throat Respiratory: Denies cough, dyspnea or wheezes Cardiovascular: Denies palpitation, chest discomfort Gastrointestinal:  Denies nausea, heartburn or change in bowel habits Skin: Denies abnormal skin rashes Lymphatics: Denies new lymphadenopathy or easy bruising Neurological:Denies numbness, tingling or new weaknesses Behavioral/Psych: Mood is stable, no new changes  Extremities: No lower extremity edema Breast: denies any pain or lumps or nodules in either breasts All other systems were reviewed with the patient and are negative.  Observations/Objective:  There were no vitals filed for this visit. There is no height or weight on file to calculate BMI.  I have reviewed the data as listed CMP Latest Ref Rng & Units 10/01/2018 08/11/2018 06/30/2017  Glucose 70 - 99 mg/dL 90 - 92  BUN 8 - 23 mg/dL 17 - 23  Creatinine 0.44 - 1.00 mg/dL 0.94 1.10(H) 0.87  Sodium 135 - 145 mmol/L 140 - 140  Potassium 3.5 - 5.1 mmol/L 4.1 - 4.7  Chloride 98 - 111 mmol/L 110 - 103  CO2 22 - 32 mmol/L 25 - 24  Calcium 8.9 - 10.3 mg/dL 8.8(L) - 9.6  Total Protein 6.5 - 8.1 g/dL 6.1(L) - -  Total Bilirubin 0.3 - 1.2 mg/dL 0.6 - -  Alkaline Phos 38 - 126 U/L 31(L) - -  AST 15 - 41 U/L 23 - -  ALT 0 - 44 U/L 18 - -    Lab Results  Component Value Date   WBC 4.1 10/01/2018   HGB 12.3 10/01/2018   HCT 38.8 10/01/2018   MCV 100.8 (H) 10/01/2018   PLT 230 10/01/2018   NEUTROABS 4.1  06/08/2014      Assessment Plan:  Malignant neoplasm of lower-inner quadrant of right female breast Right breast DCIS ER/PR positive diagnosed March 2015 7 mm focus on MRI and alert lumpectomy 06/13/2014 2 mm focus of DCIS negative margins, ER 70%, PR 80% positive status post radiation therapy;   05/07/2017:Left lumpectomy: Complex sclerosing lesion with usual ductal hyperplasia, focal atypical lobular hyperplasia, no invasive cancer identified  Surveillance: 1. Annual mammograms: [phone]; no mammographic evidence of malignancy, breast density category C 2.breast MRIs 08/11/2018: Persistent non-mass enhancement LOQ left breast at the site of CSL, [place], #not significantly changed compared to previous MRI  Current treatment: Tamoxifen 20 mg daily completed therapy Tamoxifen toxicities: Denies any hot flashes or myalgias. Stopping Tamoxifen having completed 5 years of therapy  Osteopenia: Was previously on Boniva.  Not taking it any further.  Next bone density will be done in April 2020.  Calcium and vitamin D Bone density 10 2015: T score -1.8.  Bone Density July 2020: T score -1.9  Depression: Off anti depressants November 2019: Dislocated intraocular lens surgery  Radiology had recommended excision of the CSL.  Patient decided to keep a close watch on this so she will need annual breast MRIs for assessment.  We are getting MRIs every year along with mammograms once a year Return to clinic in 1 year for follow up.   I discussed the assessment and treatment plan with the patient. The patient was provided an opportunity to ask questions and all were answered. The patient agreed with the plan and demonstrated an understanding of the instructions. The patient was advised to call back or seek an in-person evaluation if the symptoms worsen or if the condition fails to improve as anticipated.   I provided 15 minutes of face-to-face MyChart video visit time during this encounter.     Rulon Eisenmenger, MD 06/02/2019   I, Molly Dorshimer, am acting as scribe for Nicholas Lose, MD.  I have reviewed the above documentation for accuracy and completeness, and I agree with the above.

## 2019-06-02 ENCOUNTER — Inpatient Hospital Stay: Payer: PPO | Attending: Hematology and Oncology | Admitting: Hematology and Oncology

## 2019-06-02 DIAGNOSIS — Z79810 Long term (current) use of selective estrogen receptor modulators (SERMs): Secondary | ICD-10-CM | POA: Diagnosis not present

## 2019-06-02 DIAGNOSIS — C50311 Malignant neoplasm of lower-inner quadrant of right female breast: Secondary | ICD-10-CM | POA: Insufficient documentation

## 2019-06-02 DIAGNOSIS — F411 Generalized anxiety disorder: Secondary | ICD-10-CM | POA: Diagnosis not present

## 2019-06-02 DIAGNOSIS — Z170 Estrogen receptor positive status [ER+]: Secondary | ICD-10-CM | POA: Diagnosis not present

## 2019-06-02 DIAGNOSIS — Z1231 Encounter for screening mammogram for malignant neoplasm of breast: Secondary | ICD-10-CM

## 2019-06-02 MED ORDER — BUSPIRONE HCL 10 MG PO TABS: 10.0000 mg | ORAL_TABLET | Freq: Three times a day (TID) | ORAL | Status: DC

## 2019-06-27 DIAGNOSIS — F411 Generalized anxiety disorder: Secondary | ICD-10-CM | POA: Diagnosis not present

## 2019-07-06 DIAGNOSIS — Z0000 Encounter for general adult medical examination without abnormal findings: Secondary | ICD-10-CM | POA: Diagnosis not present

## 2019-07-06 DIAGNOSIS — M859 Disorder of bone density and structure, unspecified: Secondary | ICD-10-CM | POA: Diagnosis not present

## 2019-07-06 DIAGNOSIS — R82998 Other abnormal findings in urine: Secondary | ICD-10-CM | POA: Diagnosis not present

## 2019-07-06 DIAGNOSIS — E7849 Other hyperlipidemia: Secondary | ICD-10-CM | POA: Diagnosis not present

## 2019-07-06 DIAGNOSIS — F411 Generalized anxiety disorder: Secondary | ICD-10-CM | POA: Diagnosis not present

## 2019-07-06 DIAGNOSIS — R7301 Impaired fasting glucose: Secondary | ICD-10-CM | POA: Diagnosis not present

## 2019-07-12 ENCOUNTER — Ambulatory Visit: Payer: PPO | Admitting: Student

## 2019-07-12 ENCOUNTER — Other Ambulatory Visit: Payer: Self-pay

## 2019-07-12 VITALS — BP 118/70 | HR 80 | Ht 67.0 in | Wt 142.0 lb

## 2019-07-12 DIAGNOSIS — C50311 Malignant neoplasm of lower-inner quadrant of right female breast: Secondary | ICD-10-CM

## 2019-07-12 DIAGNOSIS — Z170 Estrogen receptor positive status [ER+]: Secondary | ICD-10-CM | POA: Diagnosis not present

## 2019-07-12 DIAGNOSIS — I493 Ventricular premature depolarization: Secondary | ICD-10-CM | POA: Diagnosis not present

## 2019-07-12 NOTE — Patient Instructions (Addendum)
Medication Instructions:  Your physician recommends that you continue on your current medications as directed. Please refer to the Current Medication list given to you today.  If you need a refill on your cardiac medications before your next appointment, please call your pharmacy.   Lab work: NONE ORDERED  TODAY  If you have labs (blood work) drawn today and your tests are completely normal, you will receive your results only by: Marland Kitchen MyChart Message (if you have MyChart) OR . A paper copy in the mail If you have any lab test that is abnormal or we need to change your treatment, we will call you to review the results.  Testing/Procedures: Your physician has requested that you have an echocardiogram. Echocardiography is a painless test that uses sound waves to create images of your heart. It provides your doctor with information about the size and shape of your heart and how well your heart's chambers and valves are working. This procedure takes approximately one hour. There are no restrictions for this procedure.  Follow-Up: At [facility], you and your health needs are our priority.  As part of our continuing mission to provide you with exceptional heart care, we have created designated Provider Care Teams.  These Care Teams include your primary Cardiologist (physician) and Advanced Practice Providers (APPs -  Physician Assistants and Nurse Practitioners) who all work together to provide you with the care you need, when you need it. You will need a follow up appointment in 1 years. Please call our office 2 months in advance to schedule this appointment.  You may see Dr. Rayann Heman  or one of the following Advanced Practice Providers on your designated Care Team:   Chanetta Marshall, NP . Tommye Standard, PA-C . Donavan Burnet PA-C   Any Other Special Instructions Will Be Listed Below (If Applicable).

## 2019-07-12 NOTE — Progress Notes (Signed)
PCP:  Marton Redwood, MD Primary Cardiologist: No primary care provider on file. Electrophysiologist: None   Linda [name] is a 73 y.o. female who presents today for routine electrophysiology followup. They are seen for Dr Rayann Heman.  Since last being seen in our clinic, the patient reports doing very well.  She denies symptoms of palpitations, chest pain, shortness of breath, orthopnea, PND, lower extremity edema, claudication, dizziness, presyncope, syncope, bleeding, or neurologic sequela. The patient is tolerating medications without difficulties.    Past Medical History:  Diagnosis Date  . Anxiety   . Arthritis   . Atrophic vaginitis   . Breast cancer ([place])    right (DCIS) s/p lumpectomy 7/15  . Cataract    BILATERAL  . Cervical dysplasia 1975  . DCIS (ductal carcinoma in situ)    RIGHT  . Depression   . Family history of adverse reaction to anesthesia    Pt stated that her mother had difficulty waking up after anesthesia  . Family history of breast cancer   . Family history of colon cancer   . Family history of prostate cancer   . Hx of radiation therapy 08/02/14- 08/24/14   right reast 4256 cGy i 16 sessions, no boost  . Hypercholesteremia   . Insomnia   . Osteopenia 11/2011   T score -1.7 FRAX not calculated due to history of bisphosphonates and current Evista  . Personal history of radiation therapy 2015   right side  . PVC's (premature ventricular contractions)    PMH  . Seasonal allergies   . Skin cancer 2006   basal cell of face   Past Surgical History:  Procedure Laterality Date  . APPENDECTOMY  1955  . BREAST BIOPSY  2010   rt bx  . BREAST BIOPSY  2009   rt  . BREAST EXCISIONAL BIOPSY Left   . BREAST LUMPECTOMY  1983   BENIGN-rt  . BREAST LUMPECTOMY WITH RADIOACTIVE SEED LOCALIZATION Right 06/13/2014   Procedure: RIGHT BREAST  RADIOACTIVE SEED GUIDED LUMPECTOMY;  Surgeon: Rolm Bookbinder, MD;  Location: [place];  Service: General;   Laterality: Right;for DCIS  . CATARACT EXTRACTION     Both eyes  . cataract surgery  2005  . COLONOSCOPY    . COLPOSCOPY    . EXCISION OF BASAL CELL CA FROM [facility]  2006  . FACELIFT  2014  . FACIAL COSMETIC SURGERY    . GYNECOLOGIC CRYOSURGERY    . KNEE SURGERY  2013   Arthroscopic-rt  . RADIOACTIVE SEED GUIDED EXCISIONAL BREAST BIOPSY Left 05/07/2017   Procedure: LEFT RADIOACTIVE SEED GUIDED EXCISIONAL BREAST BIOPSY;  Surgeon: Rolm Bookbinder, MD;  Location: [place];  Service: General;  Laterality: Left;  . REPOSITION OF LENS Right 10/01/2018   Procedure: REPOSITION OF LENS RIGHT EYE;  Surgeon: Jalene Mullet, MD;  Location: [place];  Service: Ophthalmology;  Laterality: Right;    Current Outpatient Medications  Medication Sig Dispense Refill  . ALPRAZolam (XANAX) 0.5 MG tablet Take 0.25 mg by mouth 2 (two) times daily as needed for anxiety or sleep.     . Ascorbic Acid (VITAMIN C) 1000 MG tablet Take 1,000 mg by mouth 2 (two) times daily.    Marland Kitchen b complex vitamins tablet Take 1 tablet by mouth at bedtime.    . busPIRone (BUSPAR) 10 MG tablet Take 1 tablet (10 mg total) by mouth 3 (three) times daily.    Marland Kitchen conjugated estrogens (PREMARIN) vaginal cream Place 1 Applicatorful vaginally  daily. (Patient taking differently: Place 1 Applicatorful vaginally 2 (two) times a week. ) 42.5 g 12  . loratadine (CLARITIN) 10 MG tablet Take 10 mg by mouth at bedtime.     . Multiple Vitamin (MULTIVITAMIN WITH MINERALS) TABS tablet Take 1 tablet by mouth at bedtime.    . naproxen sodium (ALEVE) 220 MG tablet Take 440 mg by mouth 2 (two) times daily as needed (for pain.).    Marland Kitchen RENOVA 0.02 % CREA Apply 1 application topically 3 (three) times a week.   2  . rosuvastatin (CRESTOR) 10 MG tablet Take 1 tablet (10 mg total) by mouth daily. (Patient taking differently: Take 10 mg by mouth at bedtime. )    . zolpidem (AMBIEN) 5 MG tablet Take 2.5 mg by mouth at bedtime.      No current  facility-administered medications for this visit.     Allergies  Allergen Reactions  . Other Other (See Comments)    ? Seasonal allergies - classic symptoms  . Serotonin Reuptake Inhibitors (Ssris) Palpitations and Other (See Comments)    INSOMNIA   . Zoloft [Sertraline] Palpitations    Feels PVCs and some skippy beats.     Social History   Socioeconomic History  . Marital status: Married    Spouse name: Not on file  . Number of children: Not on file  . Years of education: Not on file  . Highest education level: Not on file  Occupational History  . Not on file  Social Needs  . Financial resource strain: Not on file  . Food insecurity    Worry: Not on file    Inability: Not on file  . Transportation needs    Medical: Not on file    Non-medical: Not on file  Tobacco Use  . Smoking status: Former Smoker    Quit date: 05/19/1974    Years since quitting: 45.1  . Smokeless tobacco: Never Used  . Tobacco comment: smoked in college  Substance and Sexual Activity  . Alcohol use: Yes    Alcohol/week: 5.0 standard drinks    Types: 5 Standard drinks or equivalent per week    Comment: daily ETOH  . Drug use: No  . Sexual activity: Yes    Birth control/protection: Post-menopausal    Comment: G4 P4   Lifestyle  . Physical activity    Days per week: Not on file    Minutes per session: Not on file  . Stress: Not on file  Relationships  . Social Herbalist on phone: Not on file    Gets together: Not on file    Attends religious service: Not on file    Active member of club or organization: Not on file    Attends meetings of clubs or organizations: Not on file    Relationship status: Not on file  . Intimate partner violence    Fear of current or ex partner: Not on file    Emotionally abused: Not on file    Physically abused: Not on file    Forced sexual activity: Not on file  Other Topics Concern  . Not on file  Social History Narrative   Pt lives in  [place] with spouse.  4 children and 8 grandchildren.   House wife     Review of Systems: General: No chills, fever, night sweats or weight changes  Cardiovascular:  No chest pain, dyspnea on exertion, edema, orthopnea, palpitations, paroxysmal nocturnal dyspnea Dermatological: No rash, lesions or  masses Respiratory: No cough, dyspnea Urologic: No hematuria, dysuria Abdominal: No nausea, vomiting, diarrhea, bright red blood per rectum, melena, or hematemesis Neurologic: No visual changes, weakness, changes in mental status All other systems reviewed and are otherwise negative except as noted above.  Physical Exam: Vitals:   07/12/19 1029  Weight: 142 lb (64.4 kg)  Height: 5\' 7"  (1.702 m)    GEN- The patient is well appearing, alert and oriented x 3 today.   HEENT: normocephalic, atraumatic; sclera clear, conjunctiva pink; hearing intact; oropharynx clear; neck supple, no JVP Lymph- no cervical lymphadenopathy Lungs- Clear to ausculation bilaterally, normal work of breathing.  No wheezes, rales, rhonchi Heart- Regular rate and rhythm, no murmurs, rubs or gallops, PMI not laterally displaced GI- soft, non-tender, non-distended, bowel sounds present, no hepatosplenomegaly Extremities- no clubbing, cyanosis, or edema; DP/PT/radial pulses 2+ bilaterally MS- no significant deformity or atrophy Skin- warm and dry, no rash or lesion Psych- euthymic mood, full affect Neuro- strength and sensation are intact  EKG is ordered today. Personal review shows NSR at 80 bpm. Normal intervals.   Assessment and Plan:  1. PVCs She has no symptoms at this time. Not on suppressive therapy Per pt request, will check Echo next summer prior to her yearly follow up with Dr. Rayann Heman, so she can go over the results with him.   2. Breast Cancer S/p lumpectomy and radiation Did not require chemotherapy  Disposition: See Dr. Rayann Heman in 1 year, echo prior to.   Shirley Friar, PA-C  07/12/19  10:42 AM  Greater than 50% of the 25 minute visit was spent in counseling/coordination of care regarding disease state education, risk factor modification, and medication compliance.

## 2019-07-13 DIAGNOSIS — F1320 Sedative, hypnotic or anxiolytic dependence, uncomplicated: Secondary | ICD-10-CM | POA: Diagnosis not present

## 2019-07-13 DIAGNOSIS — M8580 Other specified disorders of bone density and structure, unspecified site: Secondary | ICD-10-CM | POA: Diagnosis not present

## 2019-07-13 DIAGNOSIS — R7301 Impaired fasting glucose: Secondary | ICD-10-CM | POA: Diagnosis not present

## 2019-07-13 DIAGNOSIS — Z1331 Encounter for screening for depression: Secondary | ICD-10-CM | POA: Diagnosis not present

## 2019-07-13 DIAGNOSIS — D692 Other nonthrombocytopenic purpura: Secondary | ICD-10-CM | POA: Diagnosis not present

## 2019-07-13 DIAGNOSIS — Z1339 Encounter for screening examination for other mental health and behavioral disorders: Secondary | ICD-10-CM | POA: Diagnosis not present

## 2019-07-13 DIAGNOSIS — D0511 Intraductal carcinoma in situ of right breast: Secondary | ICD-10-CM | POA: Diagnosis not present

## 2019-07-13 DIAGNOSIS — H9190 Unspecified hearing loss, unspecified ear: Secondary | ICD-10-CM | POA: Diagnosis not present

## 2019-07-13 DIAGNOSIS — F411 Generalized anxiety disorder: Secondary | ICD-10-CM | POA: Diagnosis not present

## 2019-07-13 DIAGNOSIS — E785 Hyperlipidemia, unspecified: Secondary | ICD-10-CM | POA: Diagnosis not present

## 2019-07-13 DIAGNOSIS — G629 Polyneuropathy, unspecified: Secondary | ICD-10-CM | POA: Diagnosis not present

## 2019-07-13 DIAGNOSIS — Z0000 Encounter for general adult medical examination without abnormal findings: Secondary | ICD-10-CM | POA: Diagnosis not present

## 2019-07-23 DIAGNOSIS — F411 Generalized anxiety disorder: Secondary | ICD-10-CM | POA: Diagnosis not present

## 2019-08-03 ENCOUNTER — Encounter: Payer: Self-pay | Admitting: Hematology and Oncology

## 2019-08-06 DIAGNOSIS — Z23 Encounter for immunization: Secondary | ICD-10-CM | POA: Diagnosis not present

## 2019-08-09 DIAGNOSIS — F411 Generalized anxiety disorder: Secondary | ICD-10-CM | POA: Diagnosis not present

## 2019-08-15 ENCOUNTER — Other Ambulatory Visit: Payer: PPO

## 2019-08-19 DIAGNOSIS — Z961 Presence of intraocular lens: Secondary | ICD-10-CM | POA: Diagnosis not present

## 2019-08-19 DIAGNOSIS — H35371 Puckering of macula, right eye: Secondary | ICD-10-CM | POA: Diagnosis not present

## 2019-08-19 DIAGNOSIS — H35351 Cystoid macular degeneration, right eye: Secondary | ICD-10-CM | POA: Diagnosis not present

## 2019-08-19 DIAGNOSIS — H5711 Ocular pain, right eye: Secondary | ICD-10-CM | POA: Diagnosis not present

## 2019-08-19 DIAGNOSIS — H5319 Other subjective visual disturbances: Secondary | ICD-10-CM | POA: Diagnosis not present

## 2019-08-19 DIAGNOSIS — H11431 Conjunctival hyperemia, right eye: Secondary | ICD-10-CM | POA: Diagnosis not present

## 2019-08-19 DIAGNOSIS — H2189 Other specified disorders of iris and ciliary body: Secondary | ICD-10-CM | POA: Diagnosis not present

## 2019-08-19 DIAGNOSIS — H11241 Scarring of conjunctiva, right eye: Secondary | ICD-10-CM | POA: Diagnosis not present

## 2019-08-25 ENCOUNTER — Encounter: Payer: Self-pay | Admitting: Gastroenterology

## 2019-08-29 DIAGNOSIS — F411 Generalized anxiety disorder: Secondary | ICD-10-CM | POA: Diagnosis not present

## 2019-08-30 ENCOUNTER — Ambulatory Visit
Admission: RE | Admit: 2019-08-30 | Discharge: 2019-08-30 | Disposition: A | Discharge status: Home / Self Care | Payer: PPO | Source: Ambulatory Visit | Attending: Hematology and Oncology | Admitting: Hematology and Oncology

## 2019-08-30 ENCOUNTER — Other Ambulatory Visit: Payer: Self-pay

## 2019-08-30 ENCOUNTER — Telehealth: Payer: Self-pay | Admitting: Hematology and Oncology

## 2019-08-30 DIAGNOSIS — F411 Generalized anxiety disorder: Secondary | ICD-10-CM | POA: Diagnosis not present

## 2019-08-30 DIAGNOSIS — N6323 Unspecified lump in the left breast, lower outer quadrant: Secondary | ICD-10-CM | POA: Diagnosis not present

## 2019-08-30 DIAGNOSIS — Z1231 Encounter for screening mammogram for malignant neoplasm of breast: Secondary | ICD-10-CM

## 2019-08-30 MED ORDER — GADOBUTROL 1 MMOL/ML IV SOLN
6.0000 mL | Freq: Once | INTRAVENOUS | Status: AC | PRN
  Administered 2019-08-30: 6 mL via INTRAVENOUS

## 2019-08-30 NOTE — Telephone Encounter (Signed)
I informed the patient the results of the breast MRI which showed a stable appearing lesion in the left breast where she had a previously biopsied complex sclerosing lesion. I discussed with her the radiology recommends removal of this lesion. She is open to it at this time.  I sent a message to Dr. Donne Hazel to review the MRI and call her to schedule an appointment to discuss surgery.

## 2019-09-06 ENCOUNTER — Other Ambulatory Visit: Payer: Self-pay

## 2019-09-06 DIAGNOSIS — Z20828 Contact with and (suspected) exposure to other viral communicable diseases: Secondary | ICD-10-CM | POA: Diagnosis not present

## 2019-09-06 DIAGNOSIS — Z20822 Contact with and (suspected) exposure to covid-19: Secondary | ICD-10-CM

## 2019-09-06 NOTE — Progress Notes (Signed)
[id number] 

## 2019-09-08 LAB — NOVEL CORONAVIRUS, NAA: SARS-CoV-2, NAA: NOT DETECTED

## 2019-09-16 DIAGNOSIS — N6489 Other specified disorders of breast: Secondary | ICD-10-CM | POA: Diagnosis not present

## 2019-09-16 DIAGNOSIS — D0510 Intraductal carcinoma in situ of unspecified breast: Secondary | ICD-10-CM | POA: Diagnosis not present

## 2019-09-19 DIAGNOSIS — F411 Generalized anxiety disorder: Secondary | ICD-10-CM | POA: Diagnosis not present

## 2019-10-11 DIAGNOSIS — F411 Generalized anxiety disorder: Secondary | ICD-10-CM | POA: Diagnosis not present

## 2019-10-11 DIAGNOSIS — H903 Sensorineural hearing loss, bilateral: Secondary | ICD-10-CM | POA: Diagnosis not present

## 2019-10-14 DIAGNOSIS — L659 Nonscarring hair loss, unspecified: Secondary | ICD-10-CM | POA: Diagnosis not present

## 2019-10-31 DIAGNOSIS — F411 Generalized anxiety disorder: Secondary | ICD-10-CM | POA: Diagnosis not present

## 2019-11-22 DIAGNOSIS — F411 Generalized anxiety disorder: Secondary | ICD-10-CM | POA: Diagnosis not present

## 2019-12-12 DIAGNOSIS — F411 Generalized anxiety disorder: Secondary | ICD-10-CM | POA: Diagnosis not present

## 2019-12-16 ENCOUNTER — Ambulatory Visit: Payer: PPO | Attending: Internal Medicine

## 2019-12-16 DIAGNOSIS — Z23 Encounter for immunization: Secondary | ICD-10-CM | POA: Insufficient documentation

## 2019-12-16 NOTE — Progress Notes (Signed)
   Covid-19 Vaccination Clinic  Name:  Linda [name]    MRN: [id number] DOB: 01-06-1946  12/16/2019  [name] was observed post Covid-19 immunization for 15 minutes without incidence. She was provided with Vaccine Information Sheet and instruction to access the V-Safe system.   [name] was instructed to call 911 with any severe reactions post vaccine: Marland Kitchen Difficulty breathing  . Swelling of your face and throat  . A fast heartbeat  . A bad rash all over your body  . Dizziness and weakness    Immunizations Administered    Name Date Dose VIS Date Route   Pfizer COVID-19 Vaccine 12/16/2019  3:04 PM 0.3 mL 11/04/2019 Intramuscular   Manufacturer: [place]   Lot: [id number]   [place]: [id number]

## 2019-12-26 DIAGNOSIS — L814 Other melanin hyperpigmentation: Secondary | ICD-10-CM | POA: Diagnosis not present

## 2019-12-26 DIAGNOSIS — L570 Actinic keratosis: Secondary | ICD-10-CM | POA: Diagnosis not present

## 2019-12-26 DIAGNOSIS — L309 Dermatitis, unspecified: Secondary | ICD-10-CM | POA: Diagnosis not present

## 2019-12-26 DIAGNOSIS — D225 Melanocytic nevi of trunk: Secondary | ICD-10-CM | POA: Diagnosis not present

## 2019-12-26 DIAGNOSIS — Z85828 Personal history of other malignant neoplasm of skin: Secondary | ICD-10-CM | POA: Diagnosis not present

## 2019-12-26 DIAGNOSIS — L821 Other seborrheic keratosis: Secondary | ICD-10-CM | POA: Diagnosis not present

## 2019-12-27 ENCOUNTER — Other Ambulatory Visit: Payer: Self-pay | Admitting: Hematology and Oncology

## 2019-12-27 DIAGNOSIS — Z9889 Other specified postprocedural states: Secondary | ICD-10-CM

## 2020-01-06 ENCOUNTER — Ambulatory Visit: Payer: PPO | Attending: Internal Medicine

## 2020-01-06 DIAGNOSIS — Z23 Encounter for immunization: Secondary | ICD-10-CM | POA: Insufficient documentation

## 2020-01-06 NOTE — Progress Notes (Signed)
   Covid-19 Vaccination Clinic  Name:  Tarae Hux    MRN: [id number] DOB: [birth year]  01/06/2020  Ms. Cl[place]addle was observed post Covid-19 immunization for 15 minutes without incidence. She was provided with Vaccine Information Sheet and instruction to access the V-Safe system.   Ms. Korst was instructed to call 911 with any severe reactions post vaccine: Marland Kitchen Difficulty breathing  . Swelling of your face and throat  . A fast heartbeat  . A bad rash all over your body  . Dizziness and weakness    Immunizations Administered    Name Date Dose VIS Date Route   Pfizer COVID-19 Vaccine 01/06/2020  9:29 AM 0.3 mL 11/04/2019 Intramuscular   Manufacturer: [place]   Lot: [id number]   [place]: [id number]

## 2020-01-09 DIAGNOSIS — F411 Generalized anxiety disorder: Secondary | ICD-10-CM | POA: Diagnosis not present

## 2020-01-13 ENCOUNTER — Ambulatory Visit: Payer: PPO

## 2020-02-08 ENCOUNTER — Ambulatory Visit
Admission: RE | Admit: 2020-02-08 | Discharge: 2020-02-08 | Disposition: A | Discharge status: Home / Self Care | Payer: PPO | Source: Ambulatory Visit | Attending: Hematology and Oncology | Admitting: Hematology and Oncology

## 2020-02-08 ENCOUNTER — Other Ambulatory Visit: Payer: Self-pay

## 2020-02-08 DIAGNOSIS — Z853 Personal history of malignant neoplasm of breast: Secondary | ICD-10-CM | POA: Diagnosis not present

## 2020-02-08 DIAGNOSIS — R928 Other abnormal and inconclusive findings on diagnostic imaging of breast: Secondary | ICD-10-CM | POA: Diagnosis not present

## 2020-02-08 DIAGNOSIS — Z9889 Other specified postprocedural states: Secondary | ICD-10-CM

## 2020-02-11 DIAGNOSIS — F411 Generalized anxiety disorder: Secondary | ICD-10-CM | POA: Diagnosis not present

## 2020-02-16 DIAGNOSIS — H11431 Conjunctival hyperemia, right eye: Secondary | ICD-10-CM | POA: Diagnosis not present

## 2020-02-16 DIAGNOSIS — H35371 Puckering of macula, right eye: Secondary | ICD-10-CM | POA: Diagnosis not present

## 2020-02-16 DIAGNOSIS — H2189 Other specified disorders of iris and ciliary body: Secondary | ICD-10-CM | POA: Diagnosis not present

## 2020-02-16 DIAGNOSIS — Z961 Presence of intraocular lens: Secondary | ICD-10-CM | POA: Diagnosis not present

## 2020-02-21 ENCOUNTER — Encounter: Payer: Self-pay | Admitting: Psychiatry

## 2020-02-21 ENCOUNTER — Ambulatory Visit (INDEPENDENT_AMBULATORY_CARE_PROVIDER_SITE_OTHER): Payer: PPO | Admitting: Psychiatry

## 2020-02-21 ENCOUNTER — Other Ambulatory Visit: Payer: Self-pay

## 2020-02-21 VITALS — BP 123/72 | HR 68 | Ht 67.0 in | Wt 145.0 lb

## 2020-02-21 DIAGNOSIS — F411 Generalized anxiety disorder: Secondary | ICD-10-CM | POA: Diagnosis not present

## 2020-02-21 MED ORDER — ESCITALOPRAM OXALATE 5 MG PO TABS: ORAL_TABLET | ORAL | 1 refills | Status: DC

## 2020-02-21 NOTE — Progress Notes (Signed)
[facility] MD/PA/NP Initial Note  02/21/2020 11:28 AM Linda [name]  MRN:  [id number]  Chief Complaint:  Chief Complaint    Anxiety    Dx GAD  HPI: Never had time without anxiety.  Mo and aunts and GM always had it.  Dx it about 5 years ago. Underlying sense of urgency and discomfort chronically.  No sig physical sx.  No panic.   Worrying without a theme usually.  Easily overwhelmed.  Difficulty with conflict.  Will avoid conflict.   Ambien 2.5 consistently with benefit.  Normally sleep well.   Mind races always.  Not significantly depressed.Pt reports that mood is Anxious and describes anxiety as Moderate and since childhood. Anxiety symptoms include: Excessive Worry,. Pt reports sleeps OK with Ambien. Pt reports that appetite is good. Pt reports that energy is good and good. Concentration is good. Suicidal thoughts:  denied by patient.  Rarely takes Xanax 0.25 but it does help without SE.  Rare PVC and disc question about whether remotely SSRIs worsened them. She doesn't know if this is accurate.  Visit Diagnosis:    ICD-10-CM   1. Generalized anxiety disorder  F41.1 escitalopram (LEXAPRO) 5 MG tablet    Past Psychiatric History:  Therapy Alvester Chou. prescriber Pauline Good & PCP Past Psychiatric Medication Trials:  Buspirone 10 TID NR but a little lightheaded Gabapentin  About 5 years ago tried citalopram briefly No Zoloft, paxil, duloxetine, fluoxetine.   Past Medical History:  Past Medical History:  Diagnosis Date  . Anxiety   . Arthritis   . Atrophic vaginitis   . Breast cancer ([place])    right (DCIS) s/p lumpectomy 7/15  . Cataract    BILATERAL  . Cervical dysplasia 1975  . DCIS (ductal carcinoma in situ)    RIGHT  . Depression   . Family history of adverse reaction to anesthesia    Pt stated that her mother had difficulty waking up after anesthesia  . Family history of breast cancer   . Family history of colon cancer   . Family history of prostate cancer    . Hx of radiation therapy 08/02/14- 08/24/14   right reast 4256 cGy i 16 sessions, no boost  . Hypercholesteremia   . Insomnia   . Osteopenia 11/2011   T score -1.7 FRAX not calculated due to history of bisphosphonates and current Evista  . Personal history of radiation therapy 2015   right side  . PVC's (premature ventricular contractions)    PMH  . Seasonal allergies   . Skin cancer 2006   basal cell of face    Past Surgical History:  Procedure Laterality Date  . APPENDECTOMY  1955  . BREAST BIOPSY  2010   rt bx  . BREAST BIOPSY  2009   rt  . BREAST EXCISIONAL BIOPSY Left 2018   ALH & COMPLEX SCLEROSING LESION  . BREAST LUMPECTOMY  1983   BENIGN-rt  . BREAST LUMPECTOMY Right 2015   dcis  . BREAST LUMPECTOMY WITH RADIOACTIVE SEED LOCALIZATION Right 06/13/2014   Procedure: RIGHT BREAST  RADIOACTIVE SEED GUIDED LUMPECTOMY;  Surgeon: Rolm Bookbinder, MD;  Location: [place];  Service: General;  Laterality: Right;for DCIS  . CATARACT EXTRACTION     Both eyes  . cataract surgery  2005  . COLONOSCOPY    . COLPOSCOPY    . EXCISION OF BASAL CELL CA FROM [facility]  2006  . FACELIFT  2014  . FACIAL COSMETIC SURGERY    . GYNECOLOGIC CRYOSURGERY    .  KNEE SURGERY  2013   Arthroscopic-rt  . RADIOACTIVE SEED GUIDED EXCISIONAL BREAST BIOPSY Left 05/07/2017   Procedure: LEFT RADIOACTIVE SEED GUIDED EXCISIONAL BREAST BIOPSY;  Surgeon: Rolm Bookbinder, MD;  Location: [place];  Service: General;  Laterality: Left;  . REPOSITION OF LENS Right 10/01/2018   Procedure: REPOSITION OF LENS RIGHT EYE;  Surgeon: Jalene Mullet, MD;  Location: [place];  Service: Ophthalmology;  Laterality: Right;    Family Psychiatric History: FHx anxiety. 2 sons tried several.  1 son on Wellbutrin for depression. 2nd son on med for anxiety.  Mo alcohol problem.  Family History:  Family History  Problem Relation Age of Onset  . Breast cancer Mother        DIAGNOSED IN HER 80'S   . Hyperlipidemia Mother   . Osteoporosis Mother   . Breast cancer Sister        DIAGNOSED AT AGE 61  . Hypertension Maternal Grandmother   . Breast cancer Maternal Grandmother        possibly breast cancer; had mastectomy in her 30s-40s, but don't know if it was due to cancer  . Heart disease Maternal Grandfather   . Stroke Maternal Grandfather   . Diabetes Son        TYPE 1 DIABETES  . Colon cancer Paternal Grandfather   . Cancer Paternal Grandfather        colon  . Heart failure Son   . Kidney cancer Paternal Grandmother        bladder/kidney cancer  . 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  Social History   Socioeconomic History  . Marital status: Married    Spouse name: Not on file  . Number of children: Not on file  . Years of education: Not on file  . Highest education level: Not on file  Occupational History  . Not on file  Tobacco Use  . Smoking status: Former Smoker    Quit date: 05/19/1974    Years since quitting: 45.7  . Smokeless tobacco: Never Used  . Tobacco comment: smoked in college  Substance and Sexual Activity  . Alcohol use: Yes    Alcohol/week: 5.0 standard drinks    Types: 5 Standard drinks or equivalent per week    Comment: daily ETOH  . Drug use: No  . Sexual activity: Yes    Birth control/protection: Post-menopausal    Comment: G4 P4   Other Topics Concern  . Not on file  Social History Narrative   Pt lives in [place] with spouse.  4 children and 8 grandchildren.   House wife   Social Determinants of Radio broadcast assistant Strain:   . Difficulty of Paying Living Expenses:   Food Insecurity:   . Worried About Charity fundraiser in the Last Year:   . Arboriculturist in the Last Year:   Transportation Needs:   . Film/video editor (Medical):   Marland Kitchen Lack of Transportation (Non-Medical):   Physical Activity:   . Days of Exercise per Week:   . Minutes of Exercise per  Session:   Stress:   . Feeling of Stress :   Social Connections:   . Frequency of Communication with Friends and Family:   . Frequency of Social Gatherings with Friends and Family:   . Attends Religious Services:   . Active Member of Clubs or Organizations:   . Attends Archivist  Meetings:   Marland Kitchen Marital Status:     Allergies:  Allergies  Allergen Reactions  . Other Other (See Comments)    ? Seasonal allergies - classic symptoms  . Serotonin Reuptake Inhibitors (Ssris) Palpitations and Other (See Comments)    INSOMNIA   . Zoloft [Sertraline] Palpitations    Feels PVCs and some skippy beats.     Metabolic Disorder Labs: No results found for: HGBA1C, MPG No results found for: PROLACTIN No results found for: CHOL, TRIG, HDL, CHOLHDL, VLDL, LDLCALC No results found for: TSH  Therapeutic Level Labs: No results found for: LITHIUM No results found for: VALPROATE No components found for:  CBMZ  Current Medications: Current Outpatient Medications  Medication Sig Dispense Refill  . ALPRAZolam (XANAX) 0.5 MG tablet Take 0.25 mg by mouth 2 (two) times daily as needed for anxiety or sleep.     . Ascorbic Acid (VITAMIN C) 1000 MG tablet Take 1,000 mg by mouth 2 (two) times daily.    Marland Kitchen b complex vitamins tablet Take 1 tablet by mouth at bedtime.    . busPIRone (BUSPAR) 10 MG tablet Take 1 tablet (10 mg total) by mouth 3 (three) times daily.    Marland Kitchen conjugated estrogens (PREMARIN) vaginal cream Place 1 Applicatorful vaginally daily. (Patient taking differently: Place 1 Applicatorful vaginally 2 (two) times a week. ) 42.5 g 12  . gabapentin (NEURONTIN) 300 MG capsule Take 300 mg by mouth 3 (three) times daily. For 6 mos for neuropaty    . loratadine (CLARITIN) 10 MG tablet Take 10 mg by mouth at bedtime.     . Multiple Vitamin (MULTIVITAMIN WITH MINERALS) TABS tablet Take 1 tablet by mouth at bedtime.    . naproxen sodium (ALEVE) 220 MG tablet Take 440 mg by mouth 2 (two) times daily  as needed (for pain.).    Marland Kitchen RENOVA 0.02 % CREA Apply 1 application topically 3 (three) times a week.   2  . rosuvastatin (CRESTOR) 10 MG tablet Take 1 tablet (10 mg total) by mouth daily. (Patient taking differently: Take 10 mg by mouth at bedtime. )    . zolpidem (AMBIEN) 5 MG tablet Take 2.5 mg by mouth at bedtime.     Marland Kitchen escitalopram (LEXAPRO) 5 MG tablet 1/2 tablet daily for 1 week, then 1 daily 30 tablet 1   No current facility-administered medications for this visit.    Medication Side Effects: none  Orders placed this visit:  No orders of the defined types were placed in this encounter.   Psychiatric Specialty Exam:  Review of Systems  Constitutional: Negative for appetite change, chills, fatigue and fever.  HENT: Negative for congestion, dental problem, ear pain, hearing loss, sinus pain and sneezing.   Eyes: Positive for redness. Negative for visual disturbance.  Respiratory: Negative for apnea, chest tightness, shortness of breath and wheezing.   Cardiovascular: Positive for palpitations. Negative for chest pain and leg swelling.  Gastrointestinal: Positive for constipation. Negative for abdominal distention, abdominal pain, anal bleeding and nausea.  Endocrine: Negative for polyuria.  Genitourinary: Negative for difficulty urinating, dysuria, frequency and pelvic pain.  Musculoskeletal: Positive for arthralgias. Negative for back pain, gait problem and neck pain.  Skin: Negative for rash.  Allergic/Immunologic: Positive for environmental allergies.  Neurological: Negative for dizziness, tremors, speech difficulty, weakness and headaches.  Psychiatric/Behavioral: Negative for agitation, behavioral problems, confusion, decreased concentration, dysphoric mood, hallucinations, self-injury, sleep disturbance and suicidal ideas. The patient is not hyperactive.     Blood pressure 123/72, pulse  68, height 5\' 7"  (1.702 m), weight 145 lb (65.8 kg).Body mass index is 22.71 kg/m.   General Appearance: Neat and Well Groomed  Eye Contact:  Good  Speech:  Clear and Coherent and Normal Rate  Volume:  Normal  Mood:  Anxious  Affect:  Appropriate, Congruent and Full Range  Thought Process:  Coherent and Descriptions of Associations: Intact  Orientation:  Full (Time, Place, and Person)  Thought Content: Logical   Suicidal Thoughts:  No  Homicidal Thoughts:  No  Memory:  WNL  Judgement:  Good  Insight:  Good  Psychomotor Activity:  Normal  Concentration:  Concentration: Good  Recall:  Good  Fund of Knowledge: Good  Language: Good  Assets:  Communication Skills Desire for Improvement Financial Resources/Insurance Housing Intimacy Leisure Time Physical Health Resilience Social Support Talents/Skills Transportation Vocational/Educational  ADL's:  Intact  Cognition: WNL  Prognosis:  Good   Screenings: MDQ negative  Receiving Psychotherapy: Yes  Alvester Chou, PhD.  Treatment Plan/Recommendations: This was a 1 hour appointment with the majority it time discussing the following.  Patient has a long history of generalized anxiety disorder.  We discussed the diagnosis.  It appears to run in her family.  She has tried the usual treatment of choice which are SSRIs.  We discussed the treatment options including SSRIs, benzodiazepines, buspirone, gabapentin, etc. and the relative strengths and weaknesses.  From review it appears that she has had some soft side effects with SSRIs in the past when prescribed by her primary care doctor.  We discussed in detail that sometimes SSRIs will increase anxiety in the short-term before they reduce it in the long-term.  We can usually minimize this problem by starting at low dosage.  Discussed at length that it would be perfectly reasonable for her to use the low-dose Xanax that she currently has to offset any increase and SSRI related anxiety in the short-term.  Discussed various SSRIs and their strengths and weaknesses.  Because of  ease of use we will start with Lexapro.  Because of her sensitivity to Ambien and Xanax it appears she probably is med sensitive so may benefit from less than the usual dose of SSRI.   Therefore start Lexapro 2.5 mg nightly for 1 week then 5 mg nightly.  Call if there is any side effects.  There is evidence that buspirone can improve SSRI response so we will continue the buspirone for now.  However it has not been effective enough.  She has mild side effect from buspirone making it not the best choice as far as dosage increase so we will plan to wean it at the follow-up.  Discussed side effects in detail.  She agrees to this plan We will attempt to get history from primary care doc about prior specific SSRIs that were tried.  Follow-up 6 weeks  Lynder Parents MD, DFAPA    Purnell Shoemaker, MD

## 2020-03-07 DIAGNOSIS — F411 Generalized anxiety disorder: Secondary | ICD-10-CM | POA: Diagnosis not present

## 2020-04-03 ENCOUNTER — Ambulatory Visit: Payer: PPO | Admitting: Psychiatry

## 2020-04-16 ENCOUNTER — Other Ambulatory Visit: Payer: Self-pay | Admitting: Psychiatry

## 2020-04-16 DIAGNOSIS — F411 Generalized anxiety disorder: Secondary | ICD-10-CM

## 2020-05-02 ENCOUNTER — Encounter: Payer: Self-pay | Admitting: Psychiatry

## 2020-05-02 ENCOUNTER — Other Ambulatory Visit: Payer: Self-pay

## 2020-05-02 ENCOUNTER — Ambulatory Visit (INDEPENDENT_AMBULATORY_CARE_PROVIDER_SITE_OTHER): Payer: PPO | Admitting: Psychiatry

## 2020-05-02 DIAGNOSIS — F411 Generalized anxiety disorder: Secondary | ICD-10-CM | POA: Diagnosis not present

## 2020-05-02 MED ORDER — BUSPIRONE HCL 10 MG PO TABS: 10.0000 mg | ORAL_TABLET | Freq: Three times a day (TID) | ORAL | 0 refills | Status: DC

## 2020-05-02 MED ORDER — ESCITALOPRAM OXALATE 10 MG PO TABS: 10.0000 mg | ORAL_TABLET | Freq: Every day | ORAL | 0 refills | Status: DC

## 2020-05-02 MED ORDER — ALPRAZOLAM 0.5 MG PO TABS: 0.2500 mg | ORAL_TABLET | Freq: Two times a day (BID) | ORAL | 1 refills | Status: DC | PRN

## 2020-05-02 NOTE — Progress Notes (Signed)
Linda [name] [id number] Aug 17, 1946 74 y.o.  Subjective:   Patient ID:  Linda [name] is a 74 y.o. (DOB 1946-11-10) female.  Chief Complaint:  Chief Complaint  Patient presents with  . Anxiety    HPI Linda [name] presents to the office today for follow-up of generalized anxiety disorder. First seen February 21, 2020 Lexapro 5 mg was added to buspirone.  She continued Ambien 5 mg prn.  05/02/2020 appointment the following is noted:  Still anxious without change with Lexapro.  No SE.  Asks about genetic testing.   Takes Xanax only about weekly but wants to continue it.  Ambien 2.5 mg regularluy.  2 sons responded to Wellbutrin and asks about it.   Review of Systems:  Review of Systems  Gastrointestinal: Negative for diarrhea and nausea.  Neurological: Negative for dizziness, tremors and weakness.    Medications: I have reviewed the patient's current medications.  Current Outpatient Medications  Medication Sig Dispense Refill  . ALPRAZolam (XANAX) 0.5 MG tablet Take 0.5 tablets (0.25 mg total) by mouth 2 (two) times daily as needed for anxiety or sleep. 60 tablet 1  . Ascorbic Acid (VITAMIN C) 1000 MG tablet Take 1,000 mg by mouth 2 (two) times daily.    Marland Kitchen b complex vitamins tablet Take 1 tablet by mouth at bedtime.    . busPIRone (BUSPAR) 10 MG tablet Take 1 tablet (10 mg total) by mouth 3 (three) times daily. 270 tablet 0  . conjugated estrogens (PREMARIN) vaginal cream Place 1 Applicatorful vaginally daily. (Patient taking differently: Place 1 Applicatorful vaginally 2 (two) times a week. ) 42.5 g 12  . escitalopram (LEXAPRO) 10 MG tablet Take 1 tablet (10 mg total) by mouth daily. TAKE 1 TABLET DAILY 90 tablet 0  . gabapentin (NEURONTIN) 300 MG capsule Take 300 mg by mouth 3 (three) times daily. For 6 mos for neuropaty    . loratadine (CLARITIN) 10 MG tablet Take 10 mg by mouth at bedtime.     . Multiple Vitamin (MULTIVITAMIN WITH MINERALS) TABS tablet Take 1  tablet by mouth at bedtime.    . naproxen sodium (ALEVE) 220 MG tablet Take 440 mg by mouth 2 (two) times daily as needed (for pain.).    Marland Kitchen RENOVA 0.02 % CREA Apply 1 application topically 3 (three) times a week.   2  . rosuvastatin (CRESTOR) 10 MG tablet Take 1 tablet (10 mg total) by mouth daily. (Patient taking differently: Take 10 mg by mouth at bedtime. )    . zolpidem (AMBIEN) 5 MG tablet Take 2.5 mg by mouth at bedtime.      No current facility-administered medications for this visit.    Medication Side Effects: None  Allergies:  Allergies  Allergen Reactions  . Other Other (See Comments)    ? Seasonal allergies - classic symptoms  . Serotonin Reuptake Inhibitors (Ssris) Palpitations and Other (See Comments)    INSOMNIA   . Zoloft [Sertraline] Palpitations    Feels PVCs and some skippy beats.     Past Medical History:  Diagnosis Date  . Anxiety   . Arthritis   . Atrophic vaginitis   . Breast cancer ([place])    right (DCIS) s/p lumpectomy 7/15  . Cataract    BILATERAL  . Cervical dysplasia 1975  . DCIS (ductal carcinoma in situ)    RIGHT  . Depression   . Family history of adverse reaction to anesthesia    Pt stated that her mother had difficulty waking  up after anesthesia  . Family history of breast cancer   . Family history of colon cancer   . Family history of prostate cancer   . Hx of radiation therapy 08/02/14- 08/24/14   right reast 4256 cGy i 16 sessions, no boost  . Hypercholesteremia   . Insomnia   . Osteopenia 11/2011   T score -1.7 FRAX not calculated due to history of bisphosphonates and current Evista  . Personal history of radiation therapy 2015   right side  . PVC's (premature ventricular contractions)    PMH  . Seasonal allergies   . Skin cancer 2006   basal cell of face    Family History  Problem Relation Age of Onset  . Breast cancer Mother        DIAGNOSED IN HER 80'S  . Hyperlipidemia Mother   . Osteoporosis Mother   . Breast cancer  Sister        DIAGNOSED AT AGE 74  . Hypertension Maternal Grandmother   . Breast cancer Maternal Grandmother        possibly breast cancer; had mastectomy in her 30s-40s, but don't know if it was due to cancer  . Heart disease Maternal Grandfather   . Stroke Maternal Grandfather   . Diabetes Son        TYPE 1 DIABETES  . Colon cancer Paternal Grandfather   . Cancer Paternal Grandfather        colon  . Heart failure Son   . Kidney cancer Paternal Grandmother        bladder/kidney cancer  . Prostate cancer Maternal Uncle     Social History   Socioeconomic History  . Marital status: Married    Spouse name: Not on file  . Number of children: Not on file  . Years of education: Not on file  . Highest education level: Not on file  Occupational History  . Not on file  Tobacco Use  . Smoking status: Former Smoker    Quit date: 05/19/1974    Years since quitting: 45.9  . Smokeless tobacco: Never Used  . Tobacco comment: smoked in college  Substance and Sexual Activity  . Alcohol use: Yes    Alcohol/week: 5.0 standard drinks    Types: 5 Standard drinks or equivalent per week    Comment: daily ETOH  . Drug use: No  . Sexual activity: Yes    Birth control/protection: Post-menopausal    Comment: G4 P4   Other Topics Concern  . Not on file  Social History Narrative   Pt lives in [place] with spouse.  4 children and 8 grandchildren.   House wife   Social Determinants of Radio broadcast assistant Strain:   . Difficulty of Paying Living Expenses:   Food Insecurity:   . Worried About Charity fundraiser in the Last Year:   . Arboriculturist in the Last Year:   Transportation Needs:   . Film/video editor (Medical):   Marland Kitchen Lack of Transportation (Non-Medical):   Physical Activity:   . Days of Exercise per Week:   . Minutes of Exercise per Session:   Stress:   . Feeling of Stress :   Social Connections:   . Frequency of Communication with Friends and Family:   .  Frequency of Social Gatherings with Friends and Family:   . Attends Religious Services:   . Active Member of Clubs or Organizations:   . Attends Archivist Meetings:   .  Marital Status:   Intimate Partner Violence:   . Fear of Current or Ex-Partner:   . Emotionally Abused:   Marland Kitchen Physically Abused:   . Sexually Abused:     Past Medical History, Surgical history, Social history, and Family history were reviewed and updated as appropriate.   Please see review of systems for further details on the patient's review from today.   Objective:   Physical Exam:  There were no vitals taken for this visit.  Physical Exam Constitutional:      General: She is not in acute distress. Musculoskeletal:        General: No deformity.  Neurological:     Mental Status: She is alert and oriented to person, place, and time.     Coordination: Coordination normal.  Psychiatric:        Attention and Perception: Attention and perception normal. She does not perceive auditory or visual hallucinations.        Mood and Affect: Mood is anxious. Mood is not depressed. Affect is not labile, blunt, angry, tearful or inappropriate.        Speech: Speech normal.        Behavior: Behavior normal.        Thought Content: Thought content normal. Thought content is not paranoid or delusional. Thought content does not include homicidal or suicidal ideation. Thought content does not include homicidal or suicidal plan.        Cognition and Memory: Cognition and memory normal.        Judgment: Judgment normal.     Comments: Insight intact     Lab Review:     Component Value Date/Time   NA 140 10/01/2018 0628   NA 140 06/30/2017 0904   K 4.1 10/01/2018 0628   CL 110 10/01/2018 0628   CO2 25 10/01/2018 0628   GLUCOSE 90 10/01/2018 0628   BUN 17 10/01/2018 0628   BUN 23 06/30/2017 0904   CREATININE 0.94 10/01/2018 0628   CALCIUM 8.8 (L) 10/01/2018 0628   PROT 6.1 (L) 10/01/2018 0628   ALBUMIN 3.6  10/01/2018 0628   AST 23 10/01/2018 0628   ALT 18 10/01/2018 0628   ALKPHOS 31 (L) 10/01/2018 0628   BILITOT 0.6 10/01/2018 0628   GFRNONAA 59 (L) 10/01/2018 0628   GFRAA >60 10/01/2018 0628       Component Value Date/Time   WBC 4.1 10/01/2018 0628   RBC 3.85 (L) 10/01/2018 0628   HGB 12.3 10/01/2018 0628   HCT 38.8 10/01/2018 0628   PLT 230 10/01/2018 0628   MCV 100.8 (H) 10/01/2018 0628   MCH 31.9 10/01/2018 0628   MCHC 31.7 10/01/2018 0628   RDW 13.0 10/01/2018 0628   LYMPHSABS 1.6 06/08/2014 1330   MONOABS 0.6 06/08/2014 1330   EOSABS 0.2 06/08/2014 1330   BASOSABS 0.0 06/08/2014 1330    No results found for: POCLITH, LITHIUM   No results found for: PHENYTOIN, PHENOBARB, VALPROATE, CBMZ   .res Assessment: Plan:    Linda [name] was seen today for anxiety.  Diagnoses and all orders for this visit:  Generalized anxiety disorder -     ALPRAZolam (XANAX) 0.5 MG tablet; Take 0.5 tablets (0.25 mg total) by mouth 2 (two) times daily as needed for anxiety or sleep. -     busPIRone (BUSPAR) 10 MG tablet; Take 1 tablet (10 mg total) by mouth 3 (three) times daily. -     escitalopram (LEXAPRO) 10 MG tablet; Take 1 tablet (10 mg total) by mouth  daily. TAKE 1 TABLET DAILY  Discussed diagnosis and treatment plan.  If increase Lexapro does not help then do Genesight testing. Continue Lexapro 10 mg daily Continue buspirone 10 mg 3 times daily Xanax 0.5 mg twice daily as needed anxiety.  Answered questions about Xanax use. We discussed the short-term risks associated with benzodiazepines including sedation and increased fall risk among others.  Discussed long-term side effect risk including dependence, potential withdrawal symptoms, and the potential eventual dose-related risk of dementia.  But recent studies from 2020 dispute this association between benzodiazepines and dementia risk. Newer studies in 2020 do not support an association with dementia.  If Lexapro works then try wean  buspirone to minimize meds.  Disc SE in detail and SSRI withdrawal sx.  FU 6-8 weeks.  Lynder Parents, MD, DFAPA   Please see After Visit Summary for patient specific instructions.  Future Appointments  Date Time Provider [place]  05/15/2020 11:15 AM [facility] 3 [id number]  06/01/2020  9:00 AM Nicholas Lose, MD [facility] None  06/28/2020 10:30 AM [place], Billey Co., MD [facility]-CP None  07/16/2020 10:30 AM Allred, Jeneen Rinks, MD [facility]    No orders of the defined types were placed in this encounter.   -------------------------------

## 2020-05-03 ENCOUNTER — Other Ambulatory Visit: Payer: Self-pay | Admitting: Psychiatry

## 2020-05-03 DIAGNOSIS — F411 Generalized anxiety disorder: Secondary | ICD-10-CM

## 2020-05-15 ENCOUNTER — Ambulatory Visit (HOSPITAL_COMMUNITY): Payer: PPO | Attending: Cardiology

## 2020-05-15 ENCOUNTER — Other Ambulatory Visit: Payer: Self-pay

## 2020-05-15 DIAGNOSIS — I351 Nonrheumatic aortic (valve) insufficiency: Secondary | ICD-10-CM

## 2020-05-15 DIAGNOSIS — E785 Hyperlipidemia, unspecified: Secondary | ICD-10-CM | POA: Diagnosis not present

## 2020-05-15 DIAGNOSIS — Z87891 Personal history of nicotine dependence: Secondary | ICD-10-CM | POA: Diagnosis not present

## 2020-05-15 DIAGNOSIS — I493 Ventricular premature depolarization: Secondary | ICD-10-CM

## 2020-05-15 DIAGNOSIS — R002 Palpitations: Secondary | ICD-10-CM | POA: Insufficient documentation

## 2020-05-29 DIAGNOSIS — F411 Generalized anxiety disorder: Secondary | ICD-10-CM | POA: Diagnosis not present

## 2020-05-31 ENCOUNTER — Other Ambulatory Visit: Payer: Self-pay | Admitting: Psychiatry

## 2020-05-31 DIAGNOSIS — F411 Generalized anxiety disorder: Secondary | ICD-10-CM

## 2020-05-31 NOTE — Progress Notes (Signed)
Patient Care Team: Marton Redwood, MD as PCP - General (Internal Medicine)  DIAGNOSIS:    ICD-10-CM   1. Malignant neoplasm of lower-inner quadrant of right breast of female, estrogen receptor positive ([place])  C50.311    Z17.0     SUMMARY OF ONCOLOGIC HISTORY: Oncology History  Malignant neoplasm of lower-inner quadrant of right female breast ([place])  02/03/2014 Breast MRI   7 X 6X 5 mm right breast abnormality as 2 immediately adjacent 3 mm foci of enhancement   05/19/2014 Initial Diagnosis   Malignant neoplasm of lower-inner quadrant of female breast DCIS   06/13/2014 Surgery   R Lumpectomy DCIS 2 mm size Grade 1 Er 70%, PR 80%, Margins Neg   07/25/2014 - 08/25/2014 Radiation Therapy   Adjuvant radiation therapy   10/18/2014 Procedure   genetic testing was normal no mutations identified   05/07/2017 Surgery   Left lumpectomy: Complex sclerosing lesion with usual ductal hyperplasia, focal atypical lobular hyperplasia, no invasive cancer identified    08/10/2017 - 06/02/2019 Anti-estrogen oral therapy   Tamoxifen 20 mg daily x5 years   08/11/2018 Breast MRI   Persistent non-mass enhancement within the left LOQ at anterior depth, at the site of patient's earlier MRI-guided biopsy revealing complex sclerosing lesion, atypical lobular hyperplasia and pseudoangiomatous stromal hyperplasia. This non-mass enhancement is not significantly changed in extent compared to the previous MRI of 02/05/2017 (pre biopsy). No evidence of malignancy within right breast.        CHIEF COMPLIANT: Follow-up of right breast cancer on tamoxifen  INTERVAL HISTORY: Linda [name] is a 74 y.o. with above-mentioned history of right breast cancer treated with lumpectomy, radiation, and who is currently on tamoxifen. Mammogram on 02/08/20 showed no evidence of malignancy bilaterally. She presents tot he clinic today for follow-up.    ALLERGIES:  is allergic to other, serotonin reuptake inhibitors (ssris), and  zoloft [sertraline].  MEDICATIONS:  Current Outpatient Medications  Medication Sig Dispense Refill  . ALPRAZolam (XANAX) 0.5 MG tablet Take 0.5 tablets (0.25 mg total) by mouth 2 (two) times daily as needed for anxiety or sleep. 60 tablet 1  . Ascorbic Acid (VITAMIN C) 1000 MG tablet Take 1,000 mg by mouth 2 (two) times daily.    Marland Kitchen b complex vitamins tablet Take 1 tablet by mouth at bedtime.    . busPIRone (BUSPAR) 10 MG tablet TAKE 1 TABLET BY MOUTH THREE TIMES DAILY 270 tablet 0  . conjugated estrogens (PREMARIN) vaginal cream Place 1 Applicatorful vaginally daily. (Patient taking differently: Place 1 Applicatorful vaginally 2 (two) times a week. ) 42.5 g 12  . escitalopram (LEXAPRO) 10 MG tablet Take 1 tablet (10 mg total) by mouth daily. TAKE 1 TABLET DAILY 90 tablet 0  . gabapentin (NEURONTIN) 300 MG capsule Take 300 mg by mouth 3 (three) times daily. For 6 mos for neuropaty    . loratadine (CLARITIN) 10 MG tablet Take 10 mg by mouth at bedtime.     . Multiple Vitamin (MULTIVITAMIN WITH MINERALS) TABS tablet Take 1 tablet by mouth at bedtime.    . naproxen sodium (ALEVE) 220 MG tablet Take 440 mg by mouth 2 (two) times daily as needed (for pain.).    Marland Kitchen RENOVA 0.02 % CREA Apply 1 application topically 3 (three) times a week.   2  . rosuvastatin (CRESTOR) 10 MG tablet Take 1 tablet (10 mg total) by mouth daily. (Patient taking differently: Take 10 mg by mouth at bedtime. )    . zolpidem (AMBIEN)  5 MG tablet Take 2.5 mg by mouth at bedtime.      No current facility-administered medications for this visit.    PHYSICAL EXAMINATION: ECOG PERFORMANCE STATUS: 1 - Symptomatic but completely ambulatory  Vitals:   06/01/20 1318  BP: 105/61  Pulse: 78  Resp: 17  Temp: 98.3 F (36.8 C)  SpO2: 96%   Filed Weights   06/01/20 1318  Weight: 151 lb 12.8 oz (68.9 kg)    BREAST: No palpable masses or nodules in either right or left breasts. No palpable axillary supraclavicular or  infraclavicular adenopathy no breast tenderness or nipple discharge. (exam performed in the presence of a chaperone)  LABORATORY DATA:  I have reviewed the data as listed CMP Latest Ref Rng & Units 10/01/2018 08/11/2018 06/30/2017  Glucose 70 - 99 mg/dL 90 - 92  BUN 8 - 23 mg/dL 17 - 23  Creatinine 0.44 - 1.00 mg/dL 0.94 1.10(H) 0.87  Sodium 135 - 145 mmol/L 140 - 140  Potassium 3.5 - 5.1 mmol/L 4.1 - 4.7  Chloride 98 - 111 mmol/L 110 - 103  CO2 22 - 32 mmol/L 25 - 24  Calcium 8.9 - 10.3 mg/dL 8.8(L) - 9.6  Total Protein 6.5 - 8.1 g/dL 6.1(L) - -  Total Bilirubin 0.3 - 1.2 mg/dL 0.6 - -  Alkaline Phos 38 - 126 U/L 31(L) - -  AST 15 - 41 U/L 23 - -  ALT 0 - 44 U/L 18 - -    Lab Results  Component Value Date   WBC 4.1 10/01/2018   HGB 12.3 10/01/2018   HCT 38.8 10/01/2018   MCV 100.8 (H) 10/01/2018   PLT 230 10/01/2018   NEUTROABS 4.1 06/08/2014    ASSESSMENT & PLAN:  Malignant neoplasm of lower-inner quadrant of right female breast Right breast DCIS ER/PR positive diagnosed March 2015 7 mm focus on MRI and alert lumpectomy 06/13/2014 2 mm focus of DCIS negative margins, ER 70%, PR 80% positive status post radiation therapy;   05/07/2017:Left lumpectomy: Complex sclerosing lesion with usual ductal hyperplasia, focal atypical lobular hyperplasia, no invasive cancer identified  Surveillance: 1. Annual mammograms:March2019;no mammographic evidence of malignancy, breast density category C 2.breast MRIs9/18/2019: Persistent non-mass enhancement LOQ left breast at the site of CSL, ALH, #not significantly changed compared to previous MRI  Current treatment: Tamoxifen 20 mg daily completed therapy Tamoxifen toxicities: Denies any hot flashes or myalgias. Stopping Tamoxifen having completed 5 years of therapy  Osteopenia:Was previously on Boniva. Not taking it any further. Next bone density will be done in April 2020. Calcium and vitamin D Bone density 10 2015: T score  -1.8.  Bone Density July 2020: T score -1.9  Depression:  Currently on Lexapro which appears to be helping her significantly. November 2019: Dislocated intraocular lens surgery  Radiology had recommended excision of the CSL.Patient decided to keep a close watch on this so she will need annual breast MRIs for assessment.  We are getting MRIs every year along with mammograms once a year Return to clinic in 1 year for follow up.    No orders of the defined types were placed in this encounter.  The patient has a good understanding of the overall plan. she agrees with it. she will call with any problems that may develop before the next visit here.  Total time spent: 20 mins including face to face time and time spent for planning, charting and coordination of care  Nicholas Lose, MD 06/01/2020  I, Molly Dorshimer, am acting  as scribe for Dr. Nicholas Lose.  I have reviewed the above documentation for accuracy and completeness, and I agree with the above.

## 2020-06-01 ENCOUNTER — Inpatient Hospital Stay: Payer: PPO | Attending: Hematology and Oncology | Admitting: Hematology and Oncology

## 2020-06-01 ENCOUNTER — Other Ambulatory Visit: Payer: Self-pay

## 2020-06-01 DIAGNOSIS — Z923 Personal history of irradiation: Secondary | ICD-10-CM | POA: Insufficient documentation

## 2020-06-01 DIAGNOSIS — C50311 Malignant neoplasm of lower-inner quadrant of right female breast: Secondary | ICD-10-CM | POA: Diagnosis not present

## 2020-06-01 DIAGNOSIS — Z79899 Other long term (current) drug therapy: Secondary | ICD-10-CM | POA: Diagnosis not present

## 2020-06-01 DIAGNOSIS — M810 Age-related osteoporosis without current pathological fracture: Secondary | ICD-10-CM | POA: Insufficient documentation

## 2020-06-01 DIAGNOSIS — F329 Major depressive disorder, single episode, unspecified: Secondary | ICD-10-CM | POA: Insufficient documentation

## 2020-06-01 DIAGNOSIS — Z170 Estrogen receptor positive status [ER+]: Secondary | ICD-10-CM | POA: Insufficient documentation

## 2020-06-01 DIAGNOSIS — M8580 Other specified disorders of bone density and structure, unspecified site: Secondary | ICD-10-CM | POA: Diagnosis not present

## 2020-06-01 DIAGNOSIS — Z79810 Long term (current) use of selective estrogen receptor modulators (SERMs): Secondary | ICD-10-CM | POA: Diagnosis not present

## 2020-06-01 MED ORDER — PREMARIN 0.625 MG/GM VA CREA: 1.0000 | TOPICAL_CREAM | VAGINAL | Status: DC

## 2020-06-01 NOTE — Assessment & Plan Note (Signed)
Right breast DCIS ER/PR positive diagnosed March 2015 7 mm focus on MRI and alert lumpectomy 06/13/2014 2 mm focus of DCIS negative margins, ER 70%, PR 80% positive status post radiation therapy;   05/07/2017:Left lumpectomy: Complex sclerosing lesion with usual ductal hyperplasia, focal atypical lobular hyperplasia, no invasive cancer identified  Surveillance: 1. Annual mammograms:March2019;no mammographic evidence of malignancy, breast density category C 2.breast MRIs9/18/2019: Persistent non-mass enhancement LOQ left breast at the site of CSL, ALH, #not significantly changed compared to previous MRI  Current treatment: Tamoxifen 20 mg daily completed therapy Tamoxifen toxicities: Denies any hot flashes or myalgias. Stopping Tamoxifen having completed 5 years of therapy  Osteopenia:Was previously on Boniva. Not taking it any further. Next bone density will be done in April 2020. Calcium and vitamin D Bone density 10 2015: T score -1.8.  Bone Density July 2020: T score -1.9  Depression: Off anti depressants November 2019: Dislocated intraocular lens surgery  Radiology had recommended excision of the CSL.Patient decided to keep a close watch on this so she will need annual breast MRIs for assessment.  We are getting MRIs every year along with mammograms once a year Return to clinic in 1 year for follow up.

## 2020-06-26 DIAGNOSIS — F411 Generalized anxiety disorder: Secondary | ICD-10-CM | POA: Diagnosis not present

## 2020-06-28 ENCOUNTER — Encounter: Payer: Self-pay | Admitting: Psychiatry

## 2020-06-28 ENCOUNTER — Ambulatory Visit (INDEPENDENT_AMBULATORY_CARE_PROVIDER_SITE_OTHER): Payer: PPO | Admitting: Psychiatry

## 2020-06-28 ENCOUNTER — Other Ambulatory Visit: Payer: Self-pay

## 2020-06-28 DIAGNOSIS — F411 Generalized anxiety disorder: Secondary | ICD-10-CM

## 2020-06-28 MED ORDER — ALPRAZOLAM 0.5 MG PO TABS: 0.2500 mg | ORAL_TABLET | Freq: Two times a day (BID) | ORAL | 1 refills | Status: DC | PRN

## 2020-06-28 MED ORDER — BUSPIRONE HCL 10 MG PO TABS: 10.0000 mg | ORAL_TABLET | Freq: Three times a day (TID) | ORAL | 0 refills | Status: DC

## 2020-06-28 MED ORDER — ESCITALOPRAM OXALATE 10 MG PO TABS: 15.0000 mg | ORAL_TABLET | Freq: Every day | ORAL | 0 refills | Status: DC

## 2020-06-28 NOTE — Progress Notes (Signed)
Linda [name] [id number] May 06, 1946 73 y.o.  Subjective:   Patient ID:  Linda [name] is a 74 y.o. (DOB 1946/03/25) female.  Chief Complaint:  Chief Complaint  Patient presents with  . Follow-up  . Anxiety    HPI Linda [name] presents to the office today for follow-up of generalized anxiety disorder. First seen February 21, 2020 Lexapro 5 mg was added to buspirone.  She continued Ambien 5 mg prn.  05/02/2020 appointment the following is noted:  Still anxious without change with Lexapro.  No SE.  Asks about genetic testing.   Takes Xanax only about weekly but wants to continue it.  Ambien 2.5 mg regularluy. Plan: If increase Lexapro does not help then do [employer] testing. Continue Lexapro 10 mg daily Continue buspirone 10 mg 3 times daily Gabapentin 300 TID Xanax 0.5 mg twice daily as needed anxiety.  06/28/2120 appt with the following noted: Doesn't think much difference with Lexapro 10.  Initially sleepy but it resolved.  Chronic worry and can't stop my head.  No SE. Wonders about [employer] Needs Ambien 2.5 mg HS to sleep. Still on others noted and wants better benefit.  Not depressed. Patient reports stable mood and denies depressed or irritable moods.With meds Patient denies difficulty with sleep initiation or maintenance. Denies appetite disturbance.  Patient reports that energy and motivation have been good.  Patient denies any difficulty with concentration.  Patient denies any suicidal ideation.  2 sons responded to Wellbutrin and asks about it.  Past Psychiatric History:  Therapy [facility]. prescriber Pauline Good & PCP Past Psychiatric Medication Trials:  Buspirone 10 TID NR but a little lightheaded Lexapro 10 Gabapentin  About 5 years ago tried citalopram briefly No Zoloft, paxil, duloxetine, fluoxetine.    Review of Systems:  Review of Systems  Constitutional: Positive for unexpected weight change.  Gastrointestinal: Negative for diarrhea and  nausea.  Neurological: Negative for dizziness, tremors and weakness.    Medications: I have reviewed the patient's current medications.  Current Outpatient Medications  Medication Sig Dispense Refill  . ALPRAZolam (XANAX) 0.5 MG tablet Take 0.5 tablets (0.25 mg total) by mouth 2 (two) times daily as needed for anxiety or sleep. 60 tablet 1  . Ascorbic Acid (VITAMIN C) 1000 MG tablet Take 1,000 mg by mouth 2 (two) times daily.    Marland Kitchen b complex vitamins tablet Take 1 tablet by mouth at bedtime.    . busPIRone (BUSPAR) 10 MG tablet Take 1 tablet (10 mg total) by mouth 3 (three) times daily. 270 tablet 0  . conjugated estrogens (PREMARIN) vaginal cream Place 1 Applicatorful vaginally 3 (three) times a week.    . escitalopram (LEXAPRO) 10 MG tablet Take 1.5 tablets (15 mg total) by mouth daily. TAKE 1 TABLET DAILY 135 tablet 0  . gabapentin (NEURONTIN) 300 MG capsule Take 300 mg by mouth 3 (three) times daily. For 6 mos for neuropaty    . loratadine (CLARITIN) 10 MG tablet Take 10 mg by mouth at bedtime.     . Multiple Vitamin (MULTIVITAMIN WITH MINERALS) TABS tablet Take 1 tablet by mouth at bedtime.    . naproxen sodium (ALEVE) 220 MG tablet Take 440 mg by mouth 2 (two) times daily as needed (for pain.).    Marland Kitchen RENOVA 0.02 % CREA Apply 1 application topically 3 (three) times a week.   2  . rosuvastatin (CRESTOR) 10 MG tablet Take 1 tablet (10 mg total) by mouth daily. (Patient taking differently: Take 10 mg by  mouth at bedtime. )    . zolpidem (AMBIEN) 5 MG tablet Take 2.5 mg by mouth at bedtime.      No current facility-administered medications for this visit.    Medication Side Effects: None  Allergies:  Allergies  Allergen Reactions  . Other Other (See Comments)    ? Seasonal allergies - classic symptoms    Past Medical History:  Diagnosis Date  . Anxiety   . Arthritis   . Atrophic vaginitis   . Breast cancer ([place])    right (DCIS) s/p lumpectomy 7/15  . Cataract    BILATERAL  .  Cervical dysplasia 1975  . DCIS (ductal carcinoma in situ)    RIGHT  . Depression   . Family history of adverse reaction to anesthesia    Pt stated that her mother had difficulty waking up after anesthesia  . Family history of breast cancer   . Family history of colon cancer   . Family history of prostate cancer   . Hx of radiation therapy 08/02/14- 08/24/14   right reast 4256 cGy i 16 sessions, no boost  . Hypercholesteremia   . Insomnia   . Osteopenia 11/2011   T score -1.7 FRAX not calculated due to history of bisphosphonates and current Evista  . Personal history of radiation therapy 2015   right side  . PVC's (premature ventricular contractions)    PMH  . Seasonal allergies   . Skin cancer 2006   basal cell of face    Family History  Problem Relation Age of Onset  . Breast cancer Mother        DIAGNOSED IN HER 80'S  . Hyperlipidemia Mother   . Osteoporosis Mother   . Breast cancer Sister        DIAGNOSED AT AGE 55  . Hypertension Maternal Grandmother   . Breast cancer Maternal Grandmother        possibly breast cancer; had mastectomy in her 30s-40s, but don't know if it was due to cancer  . Heart disease Maternal Grandfather   . Stroke Maternal Grandfather   . Diabetes Son        TYPE 1 DIABETES  . Colon cancer Paternal Grandfather   . Cancer Paternal Grandfather        colon  . Heart failure Son   . Kidney cancer Paternal Grandmother        bladder/kidney cancer  . Prostate cancer Maternal Uncle     Social History   Socioeconomic History  . Marital status: Married    Spouse name: Not on file  . Number of children: Not on file  . Years of education: Not on file  . Highest education level: Not on file  Occupational History  . Not on file  Tobacco Use  . Smoking status: Former Smoker    Quit date: 05/19/1974    Years since quitting: 46.1  . Smokeless tobacco: Never Used  . Tobacco comment: smoked in college  Vaping Use  . Vaping Use: Never used   Substance and Sexual Activity  . Alcohol use: Yes    Alcohol/week: 5.0 standard drinks    Types: 5 Standard drinks or equivalent per week    Comment: daily ETOH  . Drug use: No  . Sexual activity: Yes    Birth control/protection: Post-menopausal    Comment: G4 P4   Other Topics Concern  . Not on file  Social History Narrative   Pt lives in [place] with spouse.  4 children  and 8 grandchildren.   House wife   Social Determinants of Radio broadcast assistant Strain:   . Difficulty of Paying Living Expenses:   Food Insecurity:   . Worried About Charity fundraiser in the Last Year:   . Arboriculturist in the Last Year:   Transportation Needs:   . Film/video editor (Medical):   Marland Kitchen Lack of Transportation (Non-Medical):   Physical Activity:   . Days of Exercise per Week:   . Minutes of Exercise per Session:   Stress:   . Feeling of Stress :   Social Connections:   . Frequency of Communication with Friends and Family:   . Frequency of Social Gatherings with Friends and Family:   . Attends Religious Services:   . Active Member of Clubs or Organizations:   . Attends Archivist Meetings:   Marland Kitchen Marital Status:   Intimate Partner Violence:   . Fear of Current or Ex-Partner:   . Emotionally Abused:   Marland Kitchen Physically Abused:   . Sexually Abused:     Past Medical History, Surgical history, Social history, and Family history were reviewed and updated as appropriate.   Please see review of systems for further details on the patient's review from today.   Objective:   Physical Exam:  There were no vitals taken for this visit.  Physical Exam Constitutional:      General: She is not in acute distress. Musculoskeletal:        General: No deformity.  Neurological:     Mental Status: She is alert and oriented to person, place, and time.     Coordination: Coordination normal.  Psychiatric:        Attention and Perception: Attention and perception normal. She does  not perceive auditory or visual hallucinations.        Mood and Affect: Mood is anxious. Mood is not depressed. Affect is not labile, blunt, angry, tearful or inappropriate.        Speech: Speech normal. Speech is not slurred.        Behavior: Behavior normal.        Thought Content: Thought content normal. Thought content is not paranoid or delusional. Thought content does not include homicidal or suicidal ideation. Thought content does not include homicidal or suicidal plan.        Cognition and Memory: Cognition and memory normal.        Judgment: Judgment normal.     Comments: Insight intact     Lab Review:     Component Value Date/Time   NA 140 10/01/2018 0628   NA 140 06/30/2017 0904   K 4.1 10/01/2018 0628   CL 110 10/01/2018 0628   CO2 25 10/01/2018 0628   GLUCOSE 90 10/01/2018 0628   BUN 17 10/01/2018 0628   BUN 23 06/30/2017 0904   CREATININE 0.94 10/01/2018 0628   CALCIUM 8.8 (L) 10/01/2018 0628   PROT 6.1 (L) 10/01/2018 0628   ALBUMIN 3.6 10/01/2018 0628   AST 23 10/01/2018 0628   ALT 18 10/01/2018 0628   ALKPHOS 31 (L) 10/01/2018 0628   BILITOT 0.6 10/01/2018 0628   GFRNONAA 59 (L) 10/01/2018 0628   GFRAA >60 10/01/2018 0628       Component Value Date/Time   WBC 4.1 10/01/2018 0628   RBC 3.85 (L) 10/01/2018 0628   HGB 12.3 10/01/2018 0628   HCT 38.8 10/01/2018 0628   PLT 230 10/01/2018 0628   MCV 100.8 (H)  10/01/2018 0628   MCH 31.9 10/01/2018 0628   MCHC 31.7 10/01/2018 0628   RDW 13.0 10/01/2018 0628   LYMPHSABS 1.6 06/08/2014 1330   MONOABS 0.6 06/08/2014 1330   EOSABS 0.2 06/08/2014 1330   BASOSABS 0.0 06/08/2014 1330    No results found for: POCLITH, LITHIUM   No results found for: PHENYTOIN, PHENOBARB, VALPROATE, CBMZ   .res Assessment: Plan:    Linda [name] was seen today for follow-up and anxiety.  Diagnoses and all orders for this visit:  Generalized anxiety disorder -     escitalopram (LEXAPRO) 10 MG tablet; Take 1.5 tablets (15 mg  total) by mouth daily. TAKE 1 TABLET DAILY -     busPIRone (BUSPAR) 10 MG tablet; Take 1 tablet (10 mg total) by mouth 3 (three) times daily. -     ALPRAZolam (XANAX) 0.5 MG tablet; Take 0.5 tablets (0.25 mg total) by mouth 2 (two) times daily as needed for anxiety or sleep.  Discussed diagnosis and treatment plan.  [employer] testing.  Disc this in detail. Done today.  Rec increase  Lexapro 15 mg daily Continue buspirone 10 mg 3 times daily Xanax 0.5 mg twice daily as needed anxiety.  Answered questions about Xanax use. We discussed the short-term risks associated with benzodiazepines including sedation and increased fall risk among others.  Discussed long-term side effect risk including dependence, potential withdrawal symptoms, and the potential eventual dose-related risk of dementia.  But recent studies from 2020 dispute this association between benzodiazepines and dementia risk. Newer studies in 2020 do not support an association with dementia.  If Lexapro works then try wean buspirone to minimize meds.  Disc SE in detail and SSRI withdrawal sx.  FU 6-8 weeks.  Linda Parents, MD, DFAPA   Please see After Visit Summary for patient specific instructions.  Future Appointments  Date Time Provider [place]  07/16/2020 10:30 AM Thompson Grayer, MD [facility]  05/31/2021 11:30 AM Nicholas Lose, MD [facility] None    No orders of the defined types were placed in this encounter.   -------------------------------

## 2020-07-10 DIAGNOSIS — R7301 Impaired fasting glucose: Secondary | ICD-10-CM | POA: Diagnosis not present

## 2020-07-10 DIAGNOSIS — E785 Hyperlipidemia, unspecified: Secondary | ICD-10-CM | POA: Diagnosis not present

## 2020-07-10 DIAGNOSIS — M8580 Other specified disorders of bone density and structure, unspecified site: Secondary | ICD-10-CM | POA: Diagnosis not present

## 2020-07-13 ENCOUNTER — Telehealth: Payer: Self-pay

## 2020-07-13 NOTE — Telephone Encounter (Signed)
Spoke with pt regarding virtual appt on 07/16/20. Pt was advise to check her vitals prior to her appt. Pt agreed and confirmed virtual appt.

## 2020-07-16 ENCOUNTER — Telehealth (INDEPENDENT_AMBULATORY_CARE_PROVIDER_SITE_OTHER): Payer: PPO | Admitting: Internal Medicine

## 2020-07-16 ENCOUNTER — Other Ambulatory Visit: Payer: Self-pay

## 2020-07-16 ENCOUNTER — Encounter: Payer: Self-pay | Admitting: Internal Medicine

## 2020-07-16 VITALS — BP 97/53 | Ht 67.0 in | Wt 149.0 lb

## 2020-07-16 DIAGNOSIS — I493 Ventricular premature depolarization: Secondary | ICD-10-CM

## 2020-07-16 NOTE — Progress Notes (Signed)
Electrophysiology TeleHealth Note   Due to national recommendations of social distancing due to COVID 19, an audio/video telehealth visit is felt to be most appropriate for this patient at this time.  See MyChart message from today for the patient's consent to telehealth for [facility].  Date:  07/16/2020   ID:  Linda, [name] 1946-05-19, MRN [id number]  Location: patient's home  Provider location:  [place]  Evaluation Performed: Follow-up visit  PCP:  Marton Redwood, MD   Electrophysiologist:  Dr Rayann Heman  Chief Complaint:  palpitations  History of Present Illness:    Linda [name] is a 74 y.o. female who presents via telehealth conferencing today.  Since last being seen in our clinic, the patient reports doing very well.  Today, she denies symptoms of palpitations, chest pain, shortness of breath,  lower extremity edema, dizziness, presyncope, or syncope.  The patient is otherwise without complaint today.   Past Medical History:  Diagnosis Date  . Anxiety   . Arthritis   . Atrophic vaginitis   . Breast cancer ([place])    right (DCIS) s/p lumpectomy 7/15  . Cataract    BILATERAL  . Cervical dysplasia 1975  . DCIS (ductal carcinoma in situ)    RIGHT  . Depression   . Family history of adverse reaction to anesthesia    Pt stated that her mother had difficulty waking up after anesthesia  . Family history of breast cancer   . Family history of colon cancer   . Family history of prostate cancer   . Hx of radiation therapy 08/02/14- 08/24/14   right reast 4256 cGy i 16 sessions, no boost  . Hypercholesteremia   . Insomnia   . Osteopenia 11/2011   T score -1.7 FRAX not calculated due to history of bisphosphonates and current Evista  . Personal history of radiation therapy 2015   right side  . PVC's (premature ventricular contractions)    PMH  . Seasonal allergies   . Skin cancer 2006   basal cell of face    Past Surgical History:  Procedure  Laterality Date  . APPENDECTOMY  1955  . BREAST BIOPSY  2010   rt bx  . BREAST BIOPSY  2009   rt  . BREAST EXCISIONAL BIOPSY Left 2018   ALH & COMPLEX SCLEROSING LESION  . BREAST LUMPECTOMY  1983   BENIGN-rt  . BREAST LUMPECTOMY Right 2015   dcis  . BREAST LUMPECTOMY WITH RADIOACTIVE SEED LOCALIZATION Right 06/13/2014   Procedure: RIGHT BREAST  RADIOACTIVE SEED GUIDED LUMPECTOMY;  Surgeon: Rolm Bookbinder, MD;  Location: [facility];  Service: General;  Laterality: Right;for DCIS  . CATARACT EXTRACTION     Both eyes  . cataract surgery  2005  . COLONOSCOPY    . COLPOSCOPY    . EXCISION OF BASAL CELL CA FROM [facility]  2006  . FACELIFT  2014  . FACIAL COSMETIC SURGERY    . GYNECOLOGIC CRYOSURGERY    . KNEE SURGERY  2013   Arthroscopic-rt  . RADIOACTIVE SEED GUIDED EXCISIONAL BREAST BIOPSY Left 05/07/2017   Procedure: LEFT RADIOACTIVE SEED GUIDED EXCISIONAL BREAST BIOPSY;  Surgeon: Rolm Bookbinder, MD;  Location: [facility];  Service: General;  Laterality: Left;  . REPOSITION OF LENS Right 10/01/2018   Procedure: REPOSITION OF LENS RIGHT EYE;  Surgeon: Jalene Mullet, MD;  Location: [facility];  Service: Ophthalmology;  Laterality: Right;    Current Outpatient Medications  Medication Sig Dispense  Refill  . ALPRAZolam (XANAX) 0.5 MG tablet Take 0.5 tablets (0.25 mg total) by mouth 2 (two) times daily as needed for anxiety or sleep. 60 tablet 1  . Ascorbic Acid (VITAMIN C) 1000 MG tablet Take 1,000 mg by mouth 2 (two) times daily.    Marland Kitchen b complex vitamins tablet Take 1 tablet by mouth at bedtime.    . busPIRone (BUSPAR) 10 MG tablet Take 1 tablet (10 mg total) by mouth 3 (three) times daily. 270 tablet 0  . conjugated estrogens (PREMARIN) vaginal cream Place 1 Applicatorful vaginally 3 (three) times a week.    . escitalopram (LEXAPRO) 10 MG tablet Take 1.5 tablets (15 mg total) by mouth daily. TAKE 1 TABLET DAILY 135 tablet 0  . gabapentin (NEURONTIN) 300 MG  capsule Take 300 mg by mouth 3 (three) times daily. For 6 mos for neuropaty    . loratadine (CLARITIN) 10 MG tablet Take 10 mg by mouth at bedtime.     . Multiple Vitamin (MULTIVITAMIN WITH MINERALS) TABS tablet Take 1 tablet by mouth at bedtime.    . naproxen sodium (ALEVE) 220 MG tablet Take 440 mg by mouth 2 (two) times daily as needed (for pain.).    Marland Kitchen RENOVA 0.02 % CREA Apply 1 application topically 3 (three) times a week.   2  . rosuvastatin (CRESTOR) 10 MG tablet Take 1 tablet (10 mg total) by mouth daily. (Patient taking differently: Take 10 mg by mouth at bedtime. )    . zolpidem (AMBIEN) 5 MG tablet Take 2.5 mg by mouth at bedtime.      No current facility-administered medications for this visit.    Allergies:   Other   Social History:  The patient  reports that she quit smoking about 46 years ago. She has never used smokeless tobacco. She reports current alcohol use of about 5.0 standard drinks of alcohol per week. She reports that she does not use drugs.   ROS:  Please see the history of present illness.   All other systems are personally reviewed and negative.    Exam:    Vital Signs:  BP (!) 97/53   Ht 5\' 7"  (1.702 m)   Wt 149 lb (67.6 kg)   BMI 23.34 kg/m   Well sounding and appearing, alert and conversant, regular work of breathing,  good skin color Eyes- anicteric, neuro- grossly intact, skin- no apparent rash or lesions or cyanosis, mouth- oral mucosa is pink  Labs/Other Tests and Data Reviewed:    Recent Labs: No results found for requested labs within last 8760 hours.   Wt Readings from Last 3 Encounters:  07/16/20 149 lb (67.6 kg)  06/01/20 151 lb 12.8 oz (68.9 kg)  07/12/19 142 lb (64.4 kg)     ekg 07/12/19 sinus, without PVCs   ASSESSMENT & PLAN:    1.  PVCs Well controlled Asymptomatic EF is preserved  2. Mild AI/ diastolic dysfunction Asymptomatic No changes   Risks, benefits and potential toxicities for medications prescribed and/or  refilled reviewed with patient today.   Follow-up:  12 months with EP APP Repeat echo in 2 years   Patient Risk:  after full review of this patients clinical status, I feel that they are at moderate risk at this time.  Today, I have spent 15 minutes with the patient with telehealth technology discussing arrhythmia management .    Army Fossa, MD  07/16/2020 10:44 AM     [facility]  [address] [place] [address] [phone] (office) [phone] (fax)

## 2020-07-17 DIAGNOSIS — G629 Polyneuropathy, unspecified: Secondary | ICD-10-CM | POA: Diagnosis not present

## 2020-07-17 DIAGNOSIS — D692 Other nonthrombocytopenic purpura: Secondary | ICD-10-CM | POA: Diagnosis not present

## 2020-07-17 DIAGNOSIS — K625 Hemorrhage of anus and rectum: Secondary | ICD-10-CM | POA: Diagnosis not present

## 2020-07-17 DIAGNOSIS — Z853 Personal history of malignant neoplasm of breast: Secondary | ICD-10-CM | POA: Diagnosis not present

## 2020-07-17 DIAGNOSIS — R82998 Other abnormal findings in urine: Secondary | ICD-10-CM | POA: Diagnosis not present

## 2020-07-17 DIAGNOSIS — R7301 Impaired fasting glucose: Secondary | ICD-10-CM | POA: Diagnosis not present

## 2020-07-17 DIAGNOSIS — K219 Gastro-esophageal reflux disease without esophagitis: Secondary | ICD-10-CM | POA: Diagnosis not present

## 2020-07-17 DIAGNOSIS — M8580 Other specified disorders of bone density and structure, unspecified site: Secondary | ICD-10-CM | POA: Diagnosis not present

## 2020-07-17 DIAGNOSIS — E785 Hyperlipidemia, unspecified: Secondary | ICD-10-CM | POA: Diagnosis not present

## 2020-07-17 DIAGNOSIS — Z0000 Encounter for general adult medical examination without abnormal findings: Secondary | ICD-10-CM | POA: Diagnosis not present

## 2020-07-17 DIAGNOSIS — F1320 Sedative, hypnotic or anxiolytic dependence, uncomplicated: Secondary | ICD-10-CM | POA: Diagnosis not present

## 2020-07-17 DIAGNOSIS — G4700 Insomnia, unspecified: Secondary | ICD-10-CM | POA: Diagnosis not present

## 2020-07-17 DIAGNOSIS — F411 Generalized anxiety disorder: Secondary | ICD-10-CM | POA: Diagnosis not present

## 2020-07-25 DIAGNOSIS — F411 Generalized anxiety disorder: Secondary | ICD-10-CM | POA: Diagnosis not present

## 2020-07-27 ENCOUNTER — Other Ambulatory Visit: Payer: Self-pay | Admitting: Psychiatry

## 2020-07-27 DIAGNOSIS — F411 Generalized anxiety disorder: Secondary | ICD-10-CM

## 2020-08-04 DIAGNOSIS — Z23 Encounter for immunization: Secondary | ICD-10-CM | POA: Diagnosis not present

## 2020-08-06 DIAGNOSIS — R198 Other specified symptoms and signs involving the digestive system and abdomen: Secondary | ICD-10-CM | POA: Diagnosis not present

## 2020-08-06 DIAGNOSIS — K921 Melena: Secondary | ICD-10-CM | POA: Diagnosis not present

## 2020-08-13 ENCOUNTER — Other Ambulatory Visit: Payer: Self-pay

## 2020-08-13 ENCOUNTER — Ambulatory Visit
Admission: RE | Admit: 2020-08-13 | Discharge: 2020-08-13 | Disposition: A | Discharge status: Home / Self Care | Payer: PPO | Source: Ambulatory Visit | Attending: Hematology and Oncology | Admitting: Hematology and Oncology

## 2020-08-13 DIAGNOSIS — N6459 Other signs and symptoms in breast: Secondary | ICD-10-CM | POA: Diagnosis not present

## 2020-08-13 DIAGNOSIS — Z170 Estrogen receptor positive status [ER+]: Secondary | ICD-10-CM

## 2020-08-13 DIAGNOSIS — C50311 Malignant neoplasm of lower-inner quadrant of right female breast: Secondary | ICD-10-CM

## 2020-08-13 MED ORDER — GADOBUTROL 1 MMOL/ML IV SOLN
7.0000 mL | Freq: Once | INTRAVENOUS | Status: AC | PRN
  Administered 2020-08-13: 7 mL via INTRAVENOUS

## 2020-08-14 ENCOUNTER — Other Ambulatory Visit: Payer: Self-pay | Admitting: *Deleted

## 2020-08-14 ENCOUNTER — Encounter: Payer: Self-pay | Admitting: Hematology and Oncology

## 2020-08-14 ENCOUNTER — Other Ambulatory Visit: Payer: Self-pay | Admitting: Hematology and Oncology

## 2020-08-14 DIAGNOSIS — C50311 Malignant neoplasm of lower-inner quadrant of right female breast: Secondary | ICD-10-CM

## 2020-08-14 DIAGNOSIS — Z170 Estrogen receptor positive status [ER+]: Secondary | ICD-10-CM

## 2020-08-22 ENCOUNTER — Ambulatory Visit
Admission: RE | Admit: 2020-08-22 | Discharge: 2020-08-22 | Disposition: A | Discharge status: Home / Self Care | Payer: PPO | Source: Ambulatory Visit | Attending: Hematology and Oncology | Admitting: Hematology and Oncology

## 2020-08-22 ENCOUNTER — Other Ambulatory Visit: Payer: Self-pay

## 2020-08-22 ENCOUNTER — Other Ambulatory Visit: Payer: Self-pay | Admitting: Hematology and Oncology

## 2020-08-22 ENCOUNTER — Ambulatory Visit: Payer: PPO

## 2020-08-22 DIAGNOSIS — C50311 Malignant neoplasm of lower-inner quadrant of right female breast: Secondary | ICD-10-CM

## 2020-08-22 DIAGNOSIS — Z170 Estrogen receptor positive status [ER+]: Secondary | ICD-10-CM

## 2020-08-22 MED ORDER — GADOBUTROL 1 MMOL/ML IV SOLN
7.0000 mL | Freq: Once | INTRAVENOUS | Status: AC | PRN
  Administered 2020-08-22: 7 mL via INTRAVENOUS

## 2020-08-29 ENCOUNTER — Other Ambulatory Visit: Payer: Self-pay

## 2020-08-29 ENCOUNTER — Other Ambulatory Visit: Payer: PPO

## 2020-08-29 ENCOUNTER — Ambulatory Visit (INDEPENDENT_AMBULATORY_CARE_PROVIDER_SITE_OTHER): Payer: PPO | Admitting: Psychiatry

## 2020-08-29 ENCOUNTER — Encounter: Payer: Self-pay | Admitting: Psychiatry

## 2020-08-29 DIAGNOSIS — F411 Generalized anxiety disorder: Secondary | ICD-10-CM

## 2020-08-29 NOTE — Patient Instructions (Signed)
Start Viibryd as directed with meal.  Reduce Lexapro to 10 mg daily now and in 1 week reduce to 1/2 tablet daily for 1 week then stop it.

## 2020-08-29 NOTE — Progress Notes (Signed)
Linda [name] [id number] [birth year] 74 y.o.  Subjective:   Patient ID:  Linda [name] is a 74 y.o. (DOB December 27, 1945) female.  Chief Complaint:  Chief Complaint  Patient presents with  . Follow-up  . Anxiety    HPI Linda [name] presents to the office today for follow-up of generalized anxiety disorder. First seen February 21, 2020 Lexapro 5 mg was added to buspirone.  She continued Ambien 5 mg prn.  05/02/2020 appointment the following is noted:  Still anxious without change with Lexapro.  No SE.  Asks about genetic testing.   Takes Xanax only about weekly but wants to continue it.  Ambien 2.5 mg regularluy. Plan: If increase Lexapro does not help then do Genesight testing. Continue Lexapro 10 mg daily Continue buspirone 10 mg 3 times daily Gabapentin 300 TID Xanax 0.5 mg twice daily as needed anxiety.  06/28/2120 appt with the following noted: Doesn't think much difference with Lexapro 10.  Initially sleepy but it resolved.  Chronic worry and can't stop my head.  No SE. Wonders about [employer] Needs Ambien 2.5 mg HS to sleep. Still on others noted and wants better benefit.  Not depressed. Patient reports stable mood and denies depressed or irritable moods.With meds Patient denies difficulty with sleep initiation or maintenance. Denies appetite disturbance.  Patient reports that energy and motivation have been good.  Patient denies any difficulty with concentration.  Patient denies any suicidal ideation. Plan: Rec increase  Lexapro 15 mg daily Continue buspirone 10 mg 3 times daily Xanax 0.5 mg twice daily as needed anxiety. Gensight testing  08/29/20 appt noted: No particular improvements with incrase in Lexapro and is a little more sleepy. Anxiety is about 7/10.  Not depressed.  2 sons responded to Wellbutrin and asks about it.  Past Psychiatric History:  Therapy Linda [name]. prescriber Pauline Good & PCP Past Psychiatric Medication Trials:  Buspirone 10 TID NR  but a little lightheaded Lexapro 10 Gabapentin  About 5 years ago tried citalopram briefly No Zoloft, paxil, duloxetine, fluoxetine.    Review of Systems:  Review of Systems  Constitutional: Positive for unexpected weight change.  Gastrointestinal: Negative for constipation, diarrhea and nausea.  Neurological: Negative for dizziness, tremors and weakness.    Medications: I have reviewed the patient's current medications.  Current Outpatient Medications  Medication Sig Dispense Refill  . ALPRAZolam (XANAX) 0.5 MG tablet Take 0.5 tablets (0.25 mg total) by mouth 2 (two) times daily as needed for anxiety or sleep. 60 tablet 1  . Ascorbic Acid (VITAMIN C) 1000 MG tablet Take 1,000 mg by mouth 2 (two) times daily.    Marland Kitchen b complex vitamins tablet Take 1 tablet by mouth at bedtime.    . busPIRone (BUSPAR) 10 MG tablet TAKE 1 TABLET(10 MG) BY MOUTH THREE TIMES DAILY 270 tablet 0  . conjugated estrogens (PREMARIN) vaginal cream Place 1 Applicatorful vaginally 3 (three) times a week.    . escitalopram (LEXAPRO) 10 MG tablet Take 1.5 tablets (15 mg total) by mouth daily. TAKE 1 TABLET DAILY 135 tablet 0  . gabapentin (NEURONTIN) 300 MG capsule Take 300 mg by mouth 3 (three) times daily. For 6 mos for neuropaty    . loratadine (CLARITIN) 10 MG tablet Take 10 mg by mouth at bedtime.     . Multiple Vitamin (MULTIVITAMIN WITH MINERALS) TABS tablet Take 1 tablet by mouth at bedtime.    . naproxen sodium (ALEVE) 220 MG tablet Take 440 mg by mouth 2 (two) times daily  as needed (for pain.).    Marland Kitchen RENOVA 0.02 % CREA Apply 1 application topically 3 (three) times a week.   2  . rosuvastatin (CRESTOR) 10 MG tablet Take 1 tablet (10 mg total) by mouth daily. (Patient taking differently: Take 10 mg by mouth at bedtime. )    . zolpidem (AMBIEN) 5 MG tablet Take 2.5 mg by mouth at bedtime.      No current facility-administered medications for this visit.    Medication Side Effects: None  Allergies:   Allergies  Allergen Reactions  . Other Other (See Comments)    ? Seasonal allergies - classic symptoms    Past Medical History:  Diagnosis Date  . Anxiety   . Arthritis   . Atrophic vaginitis   . Breast cancer ([place])    right (DCIS) s/p lumpectomy 7/15  . Cataract    BILATERAL  . Cervical dysplasia 1975  . DCIS (ductal carcinoma in situ)    RIGHT  . Depression   . Family history of adverse reaction to anesthesia    Pt stated that her mother had difficulty waking up after anesthesia  . Family history of breast cancer   . Family history of colon cancer   . Family history of prostate cancer   . Hx of radiation therapy 08/02/14- 08/24/14   right reast 4256 cGy i 16 sessions, no boost  . Hypercholesteremia   . Insomnia   . Osteopenia 11/2011   T score -1.7 FRAX not calculated due to history of bisphosphonates and current Evista  . Personal history of radiation therapy 2015   right side  . PVC's (premature ventricular contractions)    PMH  . Seasonal allergies   . Skin cancer 2006   basal cell of face    Family History  Problem Relation Age of Onset  . Breast cancer Mother        DIAGNOSED IN HER 80'S  . Hyperlipidemia Mother   . Osteoporosis Mother   . Breast cancer Sister        DIAGNOSED AT AGE 80  . Hypertension Maternal Grandmother   . Breast cancer Maternal Grandmother        possibly breast cancer; had mastectomy in her 30s-40s, but don't know if it was due to cancer  . Heart disease Maternal Grandfather   . Stroke Maternal Grandfather   . Diabetes Son        TYPE 1 DIABETES  . Colon cancer Paternal Grandfather   . Cancer Paternal Grandfather        colon  . Heart failure Son   . Kidney cancer Paternal Grandmother        bladder/kidney cancer  . Prostate cancer Maternal Uncle     Social History   Socioeconomic History  . Marital status: Married    Spouse name: Not on file  . Number of children: Not on file  . Years of education: Not on file  .  Highest education level: Not on file  Occupational History  . Not on file  Tobacco Use  . Smoking status: Former Smoker    Quit date: 05/19/1974    Years since quitting: 46.3  . Smokeless tobacco: Never Used  . Tobacco comment: smoked in college  Vaping Use  . Vaping Use: Never used  Substance and Sexual Activity  . Alcohol use: Yes    Alcohol/week: 5.0 standard drinks    Types: 5 Standard drinks or equivalent per week    Comment: daily ETOH  .  Drug use: No  . Sexual activity: Yes    Birth control/protection: Post-menopausal    Comment: G4 P4   Other Topics Concern  . Not on file  Social History Narrative   Pt lives in [place] with spouse.  4 children and 8 grandchildren.   House wife   Social Determinants of Health   Financial Resource Strain:   . Difficulty of Paying Living Expenses: Not on file  Food Insecurity:   . Worried About Charity fundraiser in the Last Year: Not on file  . Ran Out of Food in the Last Year: Not on file  Transportation Needs:   . Lack of Transportation (Medical): Not on file  . Lack of Transportation (Non-Medical): Not on file  Physical Activity:   . Days of Exercise per Week: Not on file  . Minutes of Exercise per Session: Not on file  Stress:   . Feeling of Stress : Not on file  Social Connections:   . Frequency of Communication with Friends and Family: Not on file  . Frequency of Social Gatherings with Friends and Family: Not on file  . Attends Religious Services: Not on file  . Active Member of Clubs or Organizations: Not on file  . Attends Archivist Meetings: Not on file  . Marital Status: Not on file  Intimate Partner Violence:   . Fear of Current or Ex-Partner: Not on file  . Emotionally Abused: Not on file  . Physically Abused: Not on file  . Sexually Abused: Not on file    Past Medical History, Surgical history, Social history, and Family history were reviewed and updated as appropriate.   Please see review of  systems for further details on the patient's review from today.   Objective:   Physical Exam:  There were no vitals taken for this visit.  Physical Exam Constitutional:      General: She is not in acute distress. Musculoskeletal:        General: No deformity.  Neurological:     Mental Status: She is alert and oriented to person, place, and time.     Coordination: Coordination normal.  Psychiatric:        Attention and Perception: Attention and perception normal. She does not perceive auditory or visual hallucinations.        Mood and Affect: Mood is anxious. Mood is not depressed. Affect is not labile, blunt, angry, tearful or inappropriate.        Speech: Speech normal. Speech is not slurred.        Behavior: Behavior normal.        Thought Content: Thought content normal. Thought content is not paranoid or delusional. Thought content does not include homicidal or suicidal ideation. Thought content does not include homicidal or suicidal plan.        Cognition and Memory: Cognition and memory normal.        Judgment: Judgment normal.     Comments: Insight intact pleasant     Lab Review:     Component Value Date/Time   NA 140 10/01/2018 0628   NA 140 06/30/2017 0904   K 4.1 10/01/2018 0628   CL 110 10/01/2018 0628   CO2 25 10/01/2018 0628   GLUCOSE 90 10/01/2018 0628   BUN 17 10/01/2018 0628   BUN 23 06/30/2017 0904   CREATININE 0.94 10/01/2018 0628   CALCIUM 8.8 (L) 10/01/2018 0628   PROT 6.1 (L) 10/01/2018 0628   ALBUMIN 3.6 10/01/2018 3785  AST 23 10/01/2018 0628   ALT 18 10/01/2018 0628   ALKPHOS 31 (L) 10/01/2018 0628   BILITOT 0.6 10/01/2018 0628   GFRNONAA 59 (L) 10/01/2018 0628   GFRAA >60 10/01/2018 0628       Component Value Date/Time   WBC 4.1 10/01/2018 0628   RBC 3.85 (L) 10/01/2018 0628   HGB 12.3 10/01/2018 0628   HCT 38.8 10/01/2018 0628   PLT 230 10/01/2018 0628   MCV 100.8 (H) 10/01/2018 0628   MCH 31.9 10/01/2018 0628   MCHC 31.7  10/01/2018 0628   RDW 13.0 10/01/2018 0628   LYMPHSABS 1.6 06/08/2014 1330   MONOABS 0.6 06/08/2014 1330   EOSABS 0.2 06/08/2014 1330   BASOSABS 0.0 06/08/2014 1330    No results found for: POCLITH, LITHIUM   No results found for: PHENYTOIN, PHENOBARB, VALPROATE, CBMZ   .res Assessment: Plan:    Jazmeen was seen today for follow-up and anxiety.  Diagnoses and all orders for this visit:  Generalized anxiety disorder  Discussed diagnosis and treatment plan.  Genesight testing results disc at length.  Suggest Viibryd.  Switch Lexapro to Viibryd 20. Continue buspirone 10 mg 3 times daily Xanax 0.5 mg twice daily as needed anxiety.  Answered questions about Xanax use. We discussed the short-term risks associated with benzodiazepines including sedation and increased fall risk among others.  Discussed long-term side effect risk including dependence, potential withdrawal symptoms, and the potential eventual dose-related risk of dementia.  But recent studies from 2020 dispute this association between benzodiazepines and dementia risk. Newer studies in 2020 do not support an association with dementia.  If Viibryd works wean buspirone.  FU 6-8 weeks.  Lynder Parents, MD, DFAPA   Please see After Visit Summary for patient specific instructions.  Future Appointments  Date Time Provider [place]  09/04/2020  7:10 AM GI-315 MR 1 GI-315MRI GI-[facility]  09/04/2020  8:00 AM GI-315 MR 1 GI-315MRI GI-315 [facility]. [facility]  09/04/2020  9:10 AM [facility] MM IR 1 GI-BCGMM GI-BREAST CE  05/31/2021 11:30 AM Nicholas Lose, MD [facility] None    No orders of the defined types were placed in this encounter.   -------------------------------

## 2020-08-31 DIAGNOSIS — F411 Generalized anxiety disorder: Secondary | ICD-10-CM | POA: Diagnosis not present

## 2020-09-04 ENCOUNTER — Ambulatory Visit
Admission: RE | Admit: 2020-09-04 | Discharge: 2020-09-04 | Disposition: A | Discharge status: Home / Self Care | Payer: PPO | Source: Ambulatory Visit | Attending: Hematology and Oncology | Admitting: Hematology and Oncology

## 2020-09-04 ENCOUNTER — Other Ambulatory Visit: Payer: Self-pay

## 2020-09-04 ENCOUNTER — Other Ambulatory Visit (HOSPITAL_COMMUNITY): Payer: Self-pay | Admitting: Diagnostic Radiology

## 2020-09-04 DIAGNOSIS — C50311 Malignant neoplasm of lower-inner quadrant of right female breast: Secondary | ICD-10-CM

## 2020-09-04 DIAGNOSIS — R928 Other abnormal and inconclusive findings on diagnostic imaging of breast: Secondary | ICD-10-CM | POA: Diagnosis not present

## 2020-09-04 DIAGNOSIS — Z170 Estrogen receptor positive status [ER+]: Secondary | ICD-10-CM | POA: Diagnosis not present

## 2020-09-04 DIAGNOSIS — C50911 Malignant neoplasm of unspecified site of right female breast: Secondary | ICD-10-CM | POA: Diagnosis not present

## 2020-09-04 DIAGNOSIS — D0511 Intraductal carcinoma in situ of right breast: Secondary | ICD-10-CM | POA: Diagnosis not present

## 2020-09-04 MED ORDER — GADOBUTROL 1 MMOL/ML IV SOLN
7.0000 mL | Freq: Once | INTRAVENOUS | Status: AC | PRN
  Administered 2020-09-04: 7 mL via INTRAVENOUS

## 2020-09-06 ENCOUNTER — Encounter: Payer: Self-pay | Admitting: *Deleted

## 2020-09-13 ENCOUNTER — Other Ambulatory Visit: Payer: Self-pay | Admitting: General Surgery

## 2020-09-13 DIAGNOSIS — C50911 Malignant neoplasm of unspecified site of right female breast: Secondary | ICD-10-CM | POA: Diagnosis not present

## 2020-09-13 DIAGNOSIS — C50211 Malignant neoplasm of upper-inner quadrant of right female breast: Secondary | ICD-10-CM

## 2020-09-13 DIAGNOSIS — Z170 Estrogen receptor positive status [ER+]: Secondary | ICD-10-CM

## 2020-09-17 DIAGNOSIS — L218 Other seborrheic dermatitis: Secondary | ICD-10-CM | POA: Diagnosis not present

## 2020-09-17 DIAGNOSIS — F411 Generalized anxiety disorder: Secondary | ICD-10-CM | POA: Diagnosis not present

## 2020-09-17 DIAGNOSIS — Z85828 Personal history of other malignant neoplasm of skin: Secondary | ICD-10-CM | POA: Diagnosis not present

## 2020-09-17 DIAGNOSIS — L298 Other pruritus: Secondary | ICD-10-CM | POA: Diagnosis not present

## 2020-09-18 ENCOUNTER — Telehealth: Payer: Self-pay | Admitting: Psychiatry

## 2020-09-18 ENCOUNTER — Other Ambulatory Visit: Payer: Self-pay

## 2020-09-18 MED ORDER — VIIBRYD 20 MG PO TABS: 20.0000 mg | ORAL_TABLET | Freq: Every day | ORAL | 1 refills | Status: DC

## 2020-09-18 NOTE — Telephone Encounter (Signed)
Pt lm stating she has completed the samples of Viibryd. She is requesting a Rx . Next appt scheduled for 11/23.

## 2020-09-18 NOTE — Telephone Encounter (Signed)
Looks like she should be on Viibryd 20 mg ?

## 2020-09-18 NOTE — Telephone Encounter (Signed)
Rx sent 

## 2020-09-18 NOTE — Telephone Encounter (Signed)
Yes please send in prescription for Viibryd 20 mg tablets 1 daily #30-1 refill

## 2020-09-19 ENCOUNTER — Telehealth: Payer: Self-pay | Admitting: Hematology and Oncology

## 2020-09-19 NOTE — Telephone Encounter (Signed)
Per 10/26 scheduling msg- scheduled and called patient- she is aware.

## 2020-09-20 ENCOUNTER — Encounter: Payer: Self-pay | Admitting: *Deleted

## 2020-09-21 ENCOUNTER — Other Ambulatory Visit: Payer: Self-pay | Admitting: General Surgery

## 2020-09-21 ENCOUNTER — Other Ambulatory Visit (HOSPITAL_COMMUNITY): Payer: PPO

## 2020-09-21 DIAGNOSIS — Z170 Estrogen receptor positive status [ER+]: Secondary | ICD-10-CM

## 2020-09-21 DIAGNOSIS — C50211 Malignant neoplasm of upper-inner quadrant of right female breast: Secondary | ICD-10-CM

## 2020-09-24 ENCOUNTER — Telehealth: Payer: Self-pay | Admitting: Hematology and Oncology

## 2020-09-24 ENCOUNTER — Inpatient Hospital Stay: Payer: PPO | Attending: Hematology and Oncology | Admitting: Hematology and Oncology

## 2020-09-24 ENCOUNTER — Other Ambulatory Visit: Payer: Self-pay

## 2020-09-24 DIAGNOSIS — Z79899 Other long term (current) drug therapy: Secondary | ICD-10-CM | POA: Diagnosis not present

## 2020-09-24 DIAGNOSIS — Z170 Estrogen receptor positive status [ER+]: Secondary | ICD-10-CM | POA: Insufficient documentation

## 2020-09-24 DIAGNOSIS — M8580 Other specified disorders of bone density and structure, unspecified site: Secondary | ICD-10-CM | POA: Insufficient documentation

## 2020-09-24 DIAGNOSIS — Z923 Personal history of irradiation: Secondary | ICD-10-CM | POA: Diagnosis not present

## 2020-09-24 DIAGNOSIS — Z79810 Long term (current) use of selective estrogen receptor modulators (SERMs): Secondary | ICD-10-CM | POA: Diagnosis not present

## 2020-09-24 DIAGNOSIS — C50311 Malignant neoplasm of lower-inner quadrant of right female breast: Secondary | ICD-10-CM | POA: Diagnosis not present

## 2020-09-24 MED ORDER — CALCIUM CITRATE-VITAMIN D 250-100 MG-UNIT PO TABS: 1.0000 | ORAL_TABLET | Freq: Two times a day (BID) | ORAL | Status: AC

## 2020-09-24 NOTE — Progress Notes (Signed)
Patient Care Team: Marton Redwood, MD as PCP - General (Internal Medicine) Mauro Kaufmann, RN as Oncology Nurse Navigator Rockwell Germany, RN as Oncology Nurse Navigator  DIAGNOSIS:    ICD-10-CM   1. Malignant neoplasm of lower-inner quadrant of right breast of female, estrogen receptor positive ([place])  C50.311    Z17.0     SUMMARY OF ONCOLOGIC HISTORY: Oncology History  Malignant neoplasm of lower-inner quadrant of right female breast ([place])  02/03/2014 Breast MRI   7 X 6X 5 mm right breast abnormality as 2 immediately adjacent 3 mm foci of enhancement   05/19/2014 Initial Diagnosis   Malignant neoplasm of lower-inner quadrant of female breast DCIS   06/13/2014 Surgery   R Lumpectomy DCIS 2 mm size Grade 1 Er 70%, PR 80%, Margins Neg   07/25/2014 - 08/25/2014 Radiation Therapy   Adjuvant radiation therapy   10/18/2014 Procedure   genetic testing was normal no mutations identified   05/07/2017 Surgery   Left lumpectomy: Complex sclerosing lesion with usual ductal hyperplasia, focal atypical lobular hyperplasia, no invasive cancer identified    08/10/2017 - 06/02/2019 Anti-estrogen oral therapy   Tamoxifen 20 mg daily x5 years   08/11/2018 Breast MRI   Persistent non-mass enhancement within the left LOQ at anterior depth, at the site of patient's earlier MRI-guided biopsy revealing complex sclerosing lesion, atypical lobular hyperplasia and pseudoangiomatous stromal hyperplasia. This non-mass enhancement is not significantly changed in extent compared to the previous MRI of 02/05/2017 (pre biopsy). No evidence of malignancy within right breast.      09/04/2020 Relapse/Recurrence   Right breast biopsy medial: IDC with DCIS, grade 2, ER greater than 95%, PR 30%, Ki-67 20%, insufficient tumor for HER-2 testing     CHIEF COMPLIANT: Newly recurrent right breast cancer  INTERVAL HISTORY: Linda [name] is a 74 y.o. with above-mentioned history of right breast cancer treated with  lumpectomy, radiation, and who is currently on tamoxifen. Breast MRI on 08/13/20 showed mass-like enhancement at the right lumpectomy site and a 0.8cm mass in the lateral right breast. Biopsy on 09/04/20 showed invasive ductal carcinoma with DCIS, grade 2, insufficient tumor for HER-2 , ER >95%, PR+ 30%, Ki67 20%. She presents to the clinic today to discuss treatment options.   ALLERGIES:  is allergic to other.  MEDICATIONS:  Current Outpatient Medications  Medication Sig Dispense Refill  . ALPRAZolam (XANAX) 0.5 MG tablet Take 0.5 tablets (0.25 mg total) by mouth 2 (two) times daily as needed for anxiety or sleep. 60 tablet 1  . Ascorbic Acid (VITAMIN C) 1000 MG tablet Take 1,000 mg by mouth 2 (two) times daily.    Marland Kitchen b complex vitamins tablet Take 1 tablet by mouth at bedtime.    . busPIRone (BUSPAR) 10 MG tablet TAKE 1 TABLET(10 MG) BY MOUTH THREE TIMES DAILY 270 tablet 0  . conjugated estrogens (PREMARIN) vaginal cream Place 1 Applicatorful vaginally 3 (three) times a week.    . escitalopram (LEXAPRO) 10 MG tablet Take 1.5 tablets (15 mg total) by mouth daily. TAKE 1 TABLET DAILY 135 tablet 0  . gabapentin (NEURONTIN) 300 MG capsule Take 300 mg by mouth 3 (three) times daily. For 6 mos for neuropaty    . loratadine (CLARITIN) 10 MG tablet Take 10 mg by mouth at bedtime.     . Multiple Vitamin (MULTIVITAMIN WITH MINERALS) TABS tablet Take 1 tablet by mouth at bedtime.    . naproxen sodium (ALEVE) 220 MG tablet Take 440 mg by mouth  2 (two) times daily as needed (for pain.).    Marland Kitchen RENOVA 0.02 % CREA Apply 1 application topically 3 (three) times a week.   2  . rosuvastatin (CRESTOR) 10 MG tablet Take 1 tablet (10 mg total) by mouth daily. (Patient taking differently: Take 10 mg by mouth at bedtime. )    . Vilazodone HCl (VIIBRYD) 20 MG TABS Take 1 tablet (20 mg total) by mouth daily. 30 tablet 1  . zolpidem (AMBIEN) 5 MG tablet Take 2.5 mg by mouth at bedtime.      No current facility-administered  medications for this visit.    PHYSICAL EXAMINATION: ECOG PERFORMANCE STATUS: 1 - Symptomatic but completely ambulatory  Vitals:   09/24/20 1430  BP: 116/66  Pulse: 85  Resp: (!) 117  Temp: 97.7 F (36.5 C)  SpO2: 95%   Filed Weights   09/24/20 1430  Weight: 154 lb 6.4 oz (70 kg)     LABORATORY DATA:  I have reviewed the data as listed CMP Latest Ref Rng & Units 10/01/2018 08/11/2018 06/30/2017  Glucose 70 - 99 mg/dL 90 - 92  BUN 8 - 23 mg/dL 17 - 23  Creatinine 0.44 - 1.00 mg/dL 0.94 1.10(H) 0.87  Sodium 135 - 145 mmol/L 140 - 140  Potassium 3.5 - 5.1 mmol/L 4.1 - 4.7  Chloride 98 - 111 mmol/L 110 - 103  CO2 22 - 32 mmol/L 25 - 24  Calcium 8.9 - 10.3 mg/dL 8.8(L) - 9.6  Total Protein 6.5 - 8.1 g/dL 6.1(L) - -  Total Bilirubin 0.3 - 1.2 mg/dL 0.6 - -  Alkaline Phos 38 - 126 U/L 31(L) - -  AST 15 - 41 U/L 23 - -  ALT 0 - 44 U/L 18 - -    Lab Results  Component Value Date   WBC 4.1 10/01/2018   HGB 12.3 10/01/2018   HCT 38.8 10/01/2018   MCV 100.8 (H) 10/01/2018   PLT 230 10/01/2018   NEUTROABS 4.1 06/08/2014    ASSESSMENT & PLAN:  Malignant neoplasm of lower-inner quadrant of right female breast Right breast DCIS ER/PR positive diagnosed March 2015 7 mm focus on MRI and alert lumpectomy 06/13/2014 2 mm focus of DCIS negative margins, ER 70%, PR 80% positive status post radiation therapy;   05/07/2017:Left lumpectomy: Complex sclerosing lesion with usual ductal hyperplasia, focal atypical lobular hyperplasia, no invasive cancer identified Adjuvant radiation therapy Prior treatment: Tamoxifen 20 mg [id number]- 2020  Osteopenia:Was previously on Boniva. Not taking it any further. Next bone density will be done in April 2020. Calcium and vitamin D Bone density 10 2015: T score -1.8. Bone Density July 2020: T score -1.9  Breast MRI 08/13/2020: Masslike enhancement deep to the right lumpectomy site measuring 1 cm.  Inferiorly and laterally to the right breast  is additional enhancement not changed since 2017.  Mass measuring 8 mm which is new.  The area of [facility] has background enhancement which appears to be stable.  Biopsy 09/04/2020: Right breast medial: IDC with DCIS grade 2, ER greater than 95%, PR 30%, Ki-67 20% HER-2 not done because of insufficient tissue; right breast LOQ: DCIS  Treatment plan: I explained to the patient that the standard of care is mastectomy but given her preference and her understanding that a lumpectomy followed by antiestrogen therapy could lead to local recurrence rates are higher than a mastectomy, we decided to proceed with breast conserving surgery followed by anastrozole therapy.  Return to clinic 1 week after surgery to  discuss pathology report.    No orders of the defined types were placed in this encounter.  The patient has a good understanding of the overall plan. she agrees with it. she will call with any problems that may develop before the next visit here.  Total time spent: 30 mins including face to face time and time spent for planning, charting and coordination of care  Nicholas Lose, MD 09/24/2020  I, Cloyde Reams Dorshimer, am acting as scribe for Dr. Nicholas Lose.  I have reviewed the above documentation for accuracy and completeness, and I agree with the above.

## 2020-09-24 NOTE — Telephone Encounter (Signed)
Scheduled appointment per 1/11 los. Spoke to patient who is aware of appointment date and time.  °

## 2020-09-24 NOTE — Assessment & Plan Note (Signed)
Right breast DCIS ER/PR positive diagnosed March 2015 7 mm focus on MRI and alert lumpectomy 06/13/2014 2 mm focus of DCIS negative margins, ER 70%, PR 80% positive status post radiation therapy;   05/07/2017:Left lumpectomy: Complex sclerosing lesion with usual ductal hyperplasia, focal atypical lobular hyperplasia, no invasive cancer identified  Surveillance: 1. Annual mammograms:March2019;no mammographic evidence of malignancy, breast density category C 2.breast MRIs9/18/2019: Persistent non-mass enhancement LOQ left breast at the site of CSL, [place], #not significantly changed compared to previous MRI  Prior treatment: Tamoxifen 20 mg [id number]- 2020  Osteopenia:Was previously on Boniva. Not taking it any further. Next bone density will be done in April 2020. Calcium and vitamin D Bone density 10 2015: T score -1.8. Bone Density July 2020: T score -1.9  Depression: Currently on Lexapro which appears to be helping her significantly.  Breast MRI 08/13/2020: Masslike enhancement deep to the right lumpectomy site measuring 1 cm.  Inferiorly and laterally to the right breast is additional enhancement not changed since 2017.  Mass measuring 8 mm which is new.  The area of [facility] has background enhancement which appears to be stable.  Biopsy 09/04/2020: Right breast medial: IDC with DCIS grade 2, ER greater than 95%, PR 30%, Ki-67 20%; right breast LOQ: DCIS  Treatment plan: Patient will need a mastectomy since she had prior lumpectomy with radiation. Followed by adjuvant antiestrogen therapy.

## 2020-09-25 ENCOUNTER — Encounter: Payer: Self-pay | Admitting: *Deleted

## 2020-09-25 NOTE — Pre-Procedure Instructions (Signed)
[place], [place] [address] [place] [address] Phone: [phone] Fax: [phone]  [facility] [place], [place] - [address] AT [address] [place] [address] Phone: [phone] Fax: [phone]  [facility] [phone] - [place], [place] - [address] [place] [address] Phone: [phone] Fax: [phone]    Your procedure is scheduled on Mon., Nov. 15, 2021 from 7:30AM-9:00AM  Report to [facility] Entrance "A" at 5:30AM  Call this number if you have problems the morning of surgery:  [phone]   Remember:  Do not eat after midnight on Nov. 14th  You may drink clear liquids until 3 hours (4:30AM) prior to surgery time.  Clear liquids allowed are: Water, Juice (Non-Citric, no pulp), Black Coffee Only (no dairy or creamer), Clear Tea ( no dairy or creamer), Carbonated Beverages, Gatorade, Plain Popsicles, Plain Jell-O.  Please complete your PRE-SURGERY ENSURE drink that was provided to you by 4:30AM the morning of surgery.  Please, if able, drink it in one setting. DO NOT SIP.   Take these medicines the morning of surgery with A SIP OF WATER: BusPIRone (BUSPAR)      Gabapentin (NEURONTIN)  Vilazodone HCl (VIIBRYD)       If Needed: ALPRAZolam Duanne Moron)  7 days before surgery (10/01/20), stop taking all Aspirin (unless instructed by your doctor) and Other Aspirin containing products, Vitamins, Fish oils, and Herbal medications. Also stop all NSAIDS i.e. Advil, Ibuprofen, Motrin, Aleve, Anaprox, Naproxen, BC, Goody Powders, and all Supplements.   No Smoking of any kind, Tobacco/Vaping, or Alcohol products 24 hours prior to your procedure. If you use a Cpap at night, you may bring all equipment for your overnight stay.    Special instructions:   [place]-  Preparing For Surgery  Before surgery, you can play an important role. Because skin is not sterile, your skin needs to be as free of germs as possible. You can reduce the number of germs on your skin by washing with CHG (chlorahexidine gluconate) Soap before surgery.  CHG is an antiseptic cleaner which kills germs and bonds with the skin to continue killing germs even after washing.    Please do not use if you have an allergy to CHG or antibacterial soaps. If your skin becomes reddened/irritated stop using the CHG.  Do not shave (including legs and underarms) for at least 48 hours prior to first CHG shower. It is OK to shave your face.  Please follow these instructions carefully.   1. Shower the NIGHT BEFORE SURGERY and the MORNING OF SURGERY with CHG.   2. If you chose to wash your hair, wash your hair first as usual with your normal shampoo.  3. After you shampoo, rinse your hair and body thoroughly to remove the shampoo.  4. Use CHG as you would any other liquid soap. You can apply CHG directly to the skin and wash gently with a scrungie or a clean washcloth.   5. Apply the CHG Soap to your body ONLY FROM THE NECK DOWN.  Do not use on open wounds or open sores. Avoid contact with your eyes, ears, mouth and genitals (private parts). Wash Face and genitals (private parts)  with your normal soap.  6. Wash thoroughly, paying special attention to the area where your surgery will be performed.  7. Thoroughly rinse your body with warm water  from the neck down.  8. DO NOT shower/wash with your normal soap after using and rinsing off the CHG Soap.  9. Pat yourself dry with a CLEAN TOWEL.  10. Wear CLEAN PAJAMAS to bed the night before surgery, wear comfortable clothes the morning of surgery  11. Place CLEAN SHEETS on your bed the night of your first shower and DO NOT SLEEP WITH PETS.   Day of Surgery:             Remember to brush your teeth WITH YOUR REGULAR TOOTHPASTE.  Do not wear  jewelry, make-up or nail polish.  Do not wear lotions, powders, or perfumes, or deodorant.  Do not shave 48 hours prior to surgery.   Do not bring valuables to the hospital.  [facility] is not responsible for any belongings or valuables.  Contacts, dentures or bridgework may not be worn into surgery.    For patients admitted to the hospital, discharge time will be determined by your treatment team.  Patients discharged the day of surgery will not be allowed to drive home, and someone age 74 and over needs to stay with them for 24 hours.   Please wear clean clothes to the hospital/surgery center.    Please read over the following fact sheets that you were given.

## 2020-09-26 ENCOUNTER — Encounter (HOSPITAL_COMMUNITY)
Admission: RE | Admit: 2020-09-26 | Discharge: 2020-09-26 | Disposition: A | Discharge status: Home / Self Care | Payer: PPO | Source: Ambulatory Visit | Attending: General Surgery | Admitting: General Surgery

## 2020-09-26 ENCOUNTER — Other Ambulatory Visit: Payer: Self-pay

## 2020-09-26 ENCOUNTER — Encounter (HOSPITAL_COMMUNITY): Payer: Self-pay

## 2020-09-26 DIAGNOSIS — Z01812 Encounter for preprocedural laboratory examination: Secondary | ICD-10-CM | POA: Insufficient documentation

## 2020-09-26 LAB — CBC
HCT: 42.4 % (ref 36.0–46.0)
Hemoglobin: 13.7 g/dL (ref 12.0–15.0)
MCH: 33.4 pg (ref 26.0–34.0)
MCHC: 32.3 g/dL (ref 30.0–36.0)
MCV: 103.4 fL — ABNORMAL HIGH (ref 80.0–100.0)
Platelets: 231 10*3/uL (ref 150–400)
RBC: 4.1 MIL/uL (ref 3.87–5.11)
RDW: 12.9 % (ref 11.5–15.5)
WBC: 5.1 10*3/uL (ref 4.0–10.5)
nRBC: 0 % (ref 0.0–0.2)

## 2020-09-26 NOTE — Progress Notes (Signed)
PCP - Marton Redwood Cardiologist - Dr. Rayann Heman  Chest x-ray - n/a EKG - n/a Stress Test - 12-12-14 ECHO - 05-15-20  ERAS Protcol - yes, Ensure given   COVID TEST- Saturday 10-06-20   Anesthesia review: yes, heart history of PVCs  Patient denies shortness of breath, fever, cough and chest pain at PAT appointment   All instructions explained to the patient, with a verbal understanding of the material. Patient agrees to go over the instructions while at home for a better understanding. Patient also instructed to self quarantine after being tested for COVID-19. The opportunity to ask questions was provided.

## 2020-10-03 ENCOUNTER — Encounter: Payer: Self-pay | Admitting: *Deleted

## 2020-10-04 ENCOUNTER — Ambulatory Visit: Payer: PPO | Admitting: Hematology and Oncology

## 2020-10-04 ENCOUNTER — Telehealth: Payer: Self-pay | Admitting: Hematology and Oncology

## 2020-10-04 ENCOUNTER — Other Ambulatory Visit (HOSPITAL_COMMUNITY): Payer: PPO

## 2020-10-04 NOTE — Telephone Encounter (Signed)
R/s appt per 11/10 sch msg - pt is aware of apt date and time

## 2020-10-05 ENCOUNTER — Encounter (HOSPITAL_BASED_OUTPATIENT_CLINIC_OR_DEPARTMENT_OTHER): Payer: Self-pay | Admitting: General Surgery

## 2020-10-05 ENCOUNTER — Other Ambulatory Visit: Payer: Self-pay

## 2020-10-05 ENCOUNTER — Telehealth: Payer: Self-pay | Admitting: Internal Medicine

## 2020-10-05 NOTE — Telephone Encounter (Signed)
   Primary Cardiologist: Thompson Grayer, MD  Chart reviewed as part of pre-operative protocol coverage. Given past medical history and time since last visit, based on ACC/AHA guidelines, Linda [name] would be at acceptable risk for the planned procedure without further cardiovascular testing.   Patient was advised that if she develops new symptoms prior to surgery to contact our office to arrange a follow-up appointment.  She verbalized understanding.  I will route this recommendation to the requesting party via Epic fax function and remove from pre-op pool.  Please call with questions.  Jossie Ng. Cleaver NP-C    10/05/2020, 2:37 PM [place] [address] Office [phone] Fax [phone]

## 2020-10-05 NOTE — Progress Notes (Signed)
Chart and office note from Dr. Rayann Heman reviewed with Dr. Lissa Hoard, who states that we will need more clarity if patient is cleared for surgery at an outpatient center. Hassan Rowan at [employer].

## 2020-10-05 NOTE — Telephone Encounter (Signed)
   [facility] Pre-operative Risk Assessment    HEARTCARE STAFF: - Please ensure there is not already an duplicate clearance open for this procedure. - Under Visit Info/Reason for Call, type in Other and utilize the format Clearance MM/DD/YY or Clearance TBD. Do not use dashes or single digits. - If request is for dental extraction, please clarify the # of teeth to be extracted.  Request for surgical clearance:  1. What type of surgery is being performed? Radioactive seed lumpectomy for breast cancer   2. When is this surgery scheduled? Seed is 10/11/2020, surgery is 10/12/2020  3. What type of clearance is required (medical clearance vs. Pharmacy clearance to hold med vs. Both)? Medical  4. Are there any medications that need to be held prior to surgery and how long? no  5. Practice name and name of physician performing surgery? [facility], Dr. Donne Hazel  6. What is the office phone number? [phone]   7.   What is the office fax number? [phone]  8.   Anesthesia type (None, local, MAC, general) ? general   Selena Zobro 10/05/2020, 2:01 PM  _________________________________________________________________   (provider comments below)  Elmo Putt from Dr. Cristal Generous office states anesthesia is questioning the patient's video visit note 07/16/2020. She states it stated the patient is at "moderate risk at this time". They would like clarification on what moderate means.

## 2020-10-06 ENCOUNTER — Other Ambulatory Visit (HOSPITAL_COMMUNITY): Payer: PPO

## 2020-10-08 ENCOUNTER — Ambulatory Visit (HOSPITAL_COMMUNITY): Payer: PPO

## 2020-10-09 ENCOUNTER — Encounter: Payer: Self-pay | Admitting: *Deleted

## 2020-10-09 ENCOUNTER — Other Ambulatory Visit (HOSPITAL_COMMUNITY)
Admission: RE | Admit: 2020-10-09 | Discharge: 2020-10-09 | Disposition: A | Discharge status: Home / Self Care | Payer: PPO | Source: Ambulatory Visit | Attending: General Surgery | Admitting: General Surgery

## 2020-10-09 DIAGNOSIS — Z01812 Encounter for preprocedural laboratory examination: Secondary | ICD-10-CM | POA: Insufficient documentation

## 2020-10-09 DIAGNOSIS — Z20822 Contact with and (suspected) exposure to covid-19: Secondary | ICD-10-CM | POA: Diagnosis not present

## 2020-10-09 LAB — SARS CORONAVIRUS 2 (TAT 6-24 HRS): SARS Coronavirus 2: NEGATIVE

## 2020-10-11 ENCOUNTER — Ambulatory Visit
Admission: RE | Admit: 2020-10-11 | Discharge: 2020-10-11 | Disposition: A | Discharge status: Home / Self Care | Payer: PPO | Source: Ambulatory Visit | Attending: General Surgery | Admitting: General Surgery

## 2020-10-11 ENCOUNTER — Other Ambulatory Visit: Payer: Self-pay

## 2020-10-11 DIAGNOSIS — Z170 Estrogen receptor positive status [ER+]: Secondary | ICD-10-CM

## 2020-10-11 DIAGNOSIS — C50211 Malignant neoplasm of upper-inner quadrant of right female breast: Secondary | ICD-10-CM

## 2020-10-11 DIAGNOSIS — R928 Other abnormal and inconclusive findings on diagnostic imaging of breast: Secondary | ICD-10-CM | POA: Diagnosis not present

## 2020-10-12 ENCOUNTER — Encounter (HOSPITAL_BASED_OUTPATIENT_CLINIC_OR_DEPARTMENT_OTHER)
Admission: RE | Disposition: A | Discharge status: Home / Self Care | Payer: Self-pay | Source: Home / Self Care | Attending: General Surgery

## 2020-10-12 ENCOUNTER — Ambulatory Visit (HOSPITAL_BASED_OUTPATIENT_CLINIC_OR_DEPARTMENT_OTHER): Payer: PPO | Admitting: Anesthesiology

## 2020-10-12 ENCOUNTER — Ambulatory Visit
Admission: RE | Admit: 2020-10-12 | Discharge: 2020-10-12 | Disposition: A | Discharge status: Home / Self Care | Payer: PPO | Source: Ambulatory Visit | Attending: General Surgery | Admitting: General Surgery

## 2020-10-12 ENCOUNTER — Encounter (HOSPITAL_BASED_OUTPATIENT_CLINIC_OR_DEPARTMENT_OTHER): Payer: Self-pay | Admitting: General Surgery

## 2020-10-12 ENCOUNTER — Other Ambulatory Visit (HOSPITAL_COMMUNITY): Payer: PPO

## 2020-10-12 ENCOUNTER — Encounter (HOSPITAL_COMMUNITY)
Admission: RE | Admit: 2020-10-12 | Discharge: 2020-10-12 | Disposition: A | Discharge status: Home / Self Care | Payer: PPO | Source: Ambulatory Visit | Attending: General Surgery | Admitting: General Surgery

## 2020-10-12 ENCOUNTER — Other Ambulatory Visit: Payer: Self-pay

## 2020-10-12 ENCOUNTER — Ambulatory Visit (HOSPITAL_COMMUNITY)
Admission: RE | Admit: 2020-10-12 | Discharge: 2020-10-12 | Disposition: A | Discharge status: Home / Self Care | Payer: PPO | Attending: General Surgery | Admitting: General Surgery

## 2020-10-12 ENCOUNTER — Ambulatory Visit (HOSPITAL_BASED_OUTPATIENT_CLINIC_OR_DEPARTMENT_OTHER): Payer: PPO | Admitting: Physician Assistant

## 2020-10-12 DIAGNOSIS — E7800 Pure hypercholesterolemia, unspecified: Secondary | ICD-10-CM | POA: Diagnosis not present

## 2020-10-12 DIAGNOSIS — Z923 Personal history of irradiation: Secondary | ICD-10-CM | POA: Diagnosis not present

## 2020-10-12 DIAGNOSIS — Z170 Estrogen receptor positive status [ER+]: Secondary | ICD-10-CM

## 2020-10-12 DIAGNOSIS — L905 Scar conditions and fibrosis of skin: Secondary | ICD-10-CM | POA: Diagnosis not present

## 2020-10-12 DIAGNOSIS — C50211 Malignant neoplasm of upper-inner quadrant of right female breast: Secondary | ICD-10-CM

## 2020-10-12 DIAGNOSIS — D0511 Intraductal carcinoma in situ of right breast: Secondary | ICD-10-CM | POA: Diagnosis not present

## 2020-10-12 DIAGNOSIS — C50911 Malignant neoplasm of unspecified site of right female breast: Secondary | ICD-10-CM | POA: Diagnosis not present

## 2020-10-12 DIAGNOSIS — Z79890 Hormone replacement therapy (postmenopausal): Secondary | ICD-10-CM | POA: Insufficient documentation

## 2020-10-12 DIAGNOSIS — Z87891 Personal history of nicotine dependence: Secondary | ICD-10-CM | POA: Diagnosis not present

## 2020-10-12 DIAGNOSIS — D0591 Unspecified type of carcinoma in situ of right breast: Secondary | ICD-10-CM | POA: Insufficient documentation

## 2020-10-12 DIAGNOSIS — G8918 Other acute postprocedural pain: Secondary | ICD-10-CM | POA: Diagnosis not present

## 2020-10-12 DIAGNOSIS — Z8042 Family history of malignant neoplasm of prostate: Secondary | ICD-10-CM | POA: Diagnosis not present

## 2020-10-12 DIAGNOSIS — Z79899 Other long term (current) drug therapy: Secondary | ICD-10-CM | POA: Diagnosis not present

## 2020-10-12 DIAGNOSIS — Z86000 Personal history of in-situ neoplasm of breast: Secondary | ICD-10-CM | POA: Insufficient documentation

## 2020-10-12 DIAGNOSIS — Z85828 Personal history of other malignant neoplasm of skin: Secondary | ICD-10-CM | POA: Insufficient documentation

## 2020-10-12 DIAGNOSIS — Z803 Family history of malignant neoplasm of breast: Secondary | ICD-10-CM | POA: Diagnosis not present

## 2020-10-12 DIAGNOSIS — C50311 Malignant neoplasm of lower-inner quadrant of right female breast: Secondary | ICD-10-CM | POA: Diagnosis not present

## 2020-10-12 DIAGNOSIS — F418 Other specified anxiety disorders: Secondary | ICD-10-CM | POA: Diagnosis not present

## 2020-10-12 HISTORY — PX: BREAST LUMPECTOMY WITH RADIOACTIVE SEED AND SENTINEL LYMPH NODE BIOPSY: SHX6550

## 2020-10-12 HISTORY — DX: Cardiac arrhythmia, unspecified: I49.9

## 2020-10-12 SURGERY — BREAST LUMPECTOMY WITH RADIOACTIVE SEED AND SENTINEL LYMPH NODE BIOPSY
Anesthesia: General | Site: Breast | Laterality: Right

## 2020-10-12 MED ORDER — MIDAZOLAM HCL 2 MG/2ML IJ SOLN
2.0000 mg | Freq: Once | INTRAMUSCULAR | Status: AC
  Administered 2020-10-12: 2 mg via INTRAVENOUS

## 2020-10-12 MED ORDER — FENTANYL CITRATE (PF) 100 MCG/2ML IJ SOLN
INTRAMUSCULAR | Status: AC
  Filled 2020-10-12: qty 2

## 2020-10-12 MED ORDER — ENSURE PRE-SURGERY PO LIQD: 296.0000 mL | Freq: Once | ORAL | Status: DC

## 2020-10-12 MED ORDER — AMISULPRIDE (ANTIEMETIC) 5 MG/2ML IV SOLN: 10.0000 mg | Freq: Once | INTRAVENOUS | Status: DC | PRN

## 2020-10-12 MED ORDER — IBUPROFEN 200 MG PO TABS
400.0000 mg | ORAL_TABLET | Freq: Once | ORAL | Status: AC
  Administered 2020-10-12: 400 mg via ORAL

## 2020-10-12 MED ORDER — DEXAMETHASONE SODIUM PHOSPHATE 10 MG/ML IJ SOLN
INTRAMUSCULAR | Status: DC | PRN
  Administered 2020-10-12: 10 mg via INTRAVENOUS

## 2020-10-12 MED ORDER — OXYCODONE HCL 5 MG PO TABS: 5.0000 mg | ORAL_TABLET | Freq: Once | ORAL | Status: DC | PRN

## 2020-10-12 MED ORDER — ACETAMINOPHEN 500 MG PO TABS: 1000.0000 mg | ORAL_TABLET | ORAL | Status: DC

## 2020-10-12 MED ORDER — ACETAMINOPHEN 500 MG PO TABS
ORAL_TABLET | ORAL | Status: AC
  Filled 2020-10-12: qty 2

## 2020-10-12 MED ORDER — PROPOFOL 10 MG/ML IV BOLUS
INTRAVENOUS | Status: AC
  Filled 2020-10-12: qty 20

## 2020-10-12 MED ORDER — DEXAMETHASONE SODIUM PHOSPHATE 10 MG/ML IJ SOLN
INTRAMUSCULAR | Status: AC
  Filled 2020-10-12: qty 1

## 2020-10-12 MED ORDER — CEFAZOLIN SODIUM-DEXTROSE 2-4 GM/100ML-% IV SOLN
INTRAVENOUS | Status: AC
  Filled 2020-10-12: qty 100

## 2020-10-12 MED ORDER — ONDANSETRON HCL 4 MG/2ML IJ SOLN
INTRAMUSCULAR | Status: DC | PRN
  Administered 2020-10-12: 4 mg via INTRAVENOUS

## 2020-10-12 MED ORDER — FENTANYL CITRATE (PF) 100 MCG/2ML IJ SOLN
INTRAMUSCULAR | Status: DC | PRN
  Administered 2020-10-12: 50 ug via INTRAVENOUS

## 2020-10-12 MED ORDER — LACTATED RINGERS IV SOLN: INTRAVENOUS | Status: DC

## 2020-10-12 MED ORDER — EPHEDRINE SULFATE-NACL 50-0.9 MG/10ML-% IV SOSY
PREFILLED_SYRINGE | INTRAVENOUS | Status: DC | PRN
  Administered 2020-10-12: 20 mg via INTRAVENOUS
  Administered 2020-10-12: 5 mg via INTRAVENOUS
  Administered 2020-10-12 (×2): 10 mg via INTRAVENOUS

## 2020-10-12 MED ORDER — TECHNETIUM TC 99M TILMANOCEPT KIT
1.0000 | PACK | Freq: Once | INTRAVENOUS | Status: AC | PRN
  Administered 2020-10-12: 1 via INTRADERMAL

## 2020-10-12 MED ORDER — PROMETHAZINE HCL 25 MG/ML IJ SOLN: 6.2500 mg | INTRAMUSCULAR | Status: DC | PRN

## 2020-10-12 MED ORDER — HYDROMORPHONE HCL 1 MG/ML IJ SOLN: 0.2500 mg | INTRAMUSCULAR | Status: DC | PRN

## 2020-10-12 MED ORDER — LIDOCAINE 2% (20 MG/ML) 5 ML SYRINGE
INTRAMUSCULAR | Status: DC | PRN
  Administered 2020-10-12: 60 mg via INTRAVENOUS

## 2020-10-12 MED ORDER — FENTANYL CITRATE (PF) 100 MCG/2ML IJ SOLN
50.0000 ug | Freq: Once | INTRAMUSCULAR | Status: AC
  Administered 2020-10-12: 50 ug via INTRAVENOUS

## 2020-10-12 MED ORDER — CEFAZOLIN SODIUM-DEXTROSE 2-4 GM/100ML-% IV SOLN
2.0000 g | INTRAVENOUS | Status: AC
  Administered 2020-10-12: 2 g via INTRAVENOUS

## 2020-10-12 MED ORDER — ONDANSETRON HCL 4 MG/2ML IJ SOLN
INTRAMUSCULAR | Status: AC
  Filled 2020-10-12: qty 2

## 2020-10-12 MED ORDER — ROPIVACAINE HCL 5 MG/ML IJ SOLN
INTRAMUSCULAR | Status: DC | PRN
  Administered 2020-10-12: 30 mL via PERINEURAL

## 2020-10-12 MED ORDER — ORAL CARE MOUTH RINSE: 15.0000 mL | Freq: Once | OROMUCOSAL | Status: DC

## 2020-10-12 MED ORDER — OXYCODONE HCL 5 MG/5ML PO SOLN: 5.0000 mg | Freq: Once | ORAL | Status: DC | PRN

## 2020-10-12 MED ORDER — CHLORHEXIDINE GLUCONATE 0.12 % MT SOLN
15.0000 mL | Freq: Once | OROMUCOSAL | Status: DC
  Filled 2020-10-12: qty 15

## 2020-10-12 MED ORDER — IBUPROFEN 200 MG PO TABS
ORAL_TABLET | ORAL | Status: AC
  Filled 2020-10-12: qty 2

## 2020-10-12 MED ORDER — MIDAZOLAM HCL 2 MG/2ML IJ SOLN
INTRAMUSCULAR | Status: AC
  Filled 2020-10-12: qty 2

## 2020-10-12 MED ORDER — BUPIVACAINE HCL (PF) 0.25 % IJ SOLN
INTRAMUSCULAR | Status: DC | PRN
  Administered 2020-10-12: 6 mL

## 2020-10-12 MED ORDER — OXYCODONE HCL 5 MG PO TABS
5.0000 mg | ORAL_TABLET | Freq: Four times a day (QID) | ORAL | 0 refills | Status: DC | PRN
Start: 2020-10-12 — End: 2021-01-21

## 2020-10-12 MED ORDER — PROPOFOL 10 MG/ML IV BOLUS
INTRAVENOUS | Status: DC | PRN
  Administered 2020-10-12: 200 mg via INTRAVENOUS

## 2020-10-12 SURGICAL SUPPLY — 59 items
ADH SKN CLS APL DERMABOND .7 (GAUZE/BANDAGES/DRESSINGS) ×1
APL PRP STRL LF DISP 70% ISPRP (MISCELLANEOUS) ×1
APPLIER CLIP 9.375 MED OPEN (MISCELLANEOUS)
APR CLP MED 9.3 20 MLT OPN (MISCELLANEOUS)
BINDER BREAST LRG (GAUZE/BANDAGES/DRESSINGS) IMPLANT
BINDER BREAST MEDIUM (GAUZE/BANDAGES/DRESSINGS) IMPLANT
BINDER BREAST XLRG (GAUZE/BANDAGES/DRESSINGS) IMPLANT
BINDER BREAST XXLRG (GAUZE/BANDAGES/DRESSINGS) IMPLANT
BLADE SURG 15 STRL LF DISP TIS (BLADE) ×1 IMPLANT
BLADE SURG 15 STRL SS (BLADE) ×2
CANISTER SUC SOCK COL 7IN (MISCELLANEOUS) IMPLANT
CANISTER SUCT 1200ML W/VALVE (MISCELLANEOUS) IMPLANT
CHLORAPREP W/TINT 26 (MISCELLANEOUS) ×2 IMPLANT
CLIP APPLIE 9.375 MED OPEN (MISCELLANEOUS) IMPLANT
CLIP VESOCCLUDE SM WIDE 6/CT (CLIP) ×2 IMPLANT
COVER BACK TABLE 60X90IN (DRAPES) ×2 IMPLANT
COVER MAYO STAND STRL (DRAPES) ×2 IMPLANT
COVER PROBE W GEL 5X96 (DRAPES) ×2 IMPLANT
COVER WAND RF STERILE (DRAPES) IMPLANT
DECANTER SPIKE VIAL GLASS SM (MISCELLANEOUS) IMPLANT
DERMABOND ADVANCED (GAUZE/BANDAGES/DRESSINGS) ×1
DERMABOND ADVANCED .7 DNX12 (GAUZE/BANDAGES/DRESSINGS) ×1 IMPLANT
DRAPE LAPAROSCOPIC ABDOMINAL (DRAPES) ×2 IMPLANT
DRAPE UTILITY XL STRL (DRAPES) ×2 IMPLANT
ELECT COATED BLADE 2.86 ST (ELECTRODE) ×2 IMPLANT
ELECT REM PT RETURN 9FT ADLT (ELECTROSURGICAL) ×2
ELECTRODE REM PT RTRN 9FT ADLT (ELECTROSURGICAL) ×1 IMPLANT
GLOVE BIO SURGEON STRL SZ7 (GLOVE) ×4 IMPLANT
GLOVE BIOGEL PI IND STRL 7.5 (GLOVE) ×1 IMPLANT
GLOVE BIOGEL PI INDICATOR 7.5 (GLOVE) ×1
GOWN STRL REUS W/ TWL LRG LVL3 (GOWN DISPOSABLE) ×2 IMPLANT
GOWN STRL REUS W/TWL LRG LVL3 (GOWN DISPOSABLE) ×4
HEMOSTAT ARISTA ABSORB 3G PWDR (HEMOSTASIS) IMPLANT
KIT MARKER MARGIN INK (KITS) ×2 IMPLANT
NDL HYPO 25X1 1.5 SAFETY (NEEDLE) ×1 IMPLANT
NDL SAFETY ECLIPSE 18X1.5 (NEEDLE) IMPLANT
NEEDLE HYPO 18GX1.5 SHARP (NEEDLE)
NEEDLE HYPO 25X1 1.5 SAFETY (NEEDLE) ×2 IMPLANT
NS IRRIG 1000ML POUR BTL (IV SOLUTION) IMPLANT
PACK BASIN DAY SURGERY FS (CUSTOM PROCEDURE TRAY) ×2 IMPLANT
PENCIL SMOKE EVACUATOR (MISCELLANEOUS) ×2 IMPLANT
RETRACTOR ONETRAX LX 90X20 (MISCELLANEOUS) IMPLANT
SLEEVE SCD COMPRESS KNEE MED (MISCELLANEOUS) ×2 IMPLANT
SPONGE LAP 4X18 RFD (DISPOSABLE) ×2 IMPLANT
STRIP CLOSURE SKIN 1/2X4 (GAUZE/BANDAGES/DRESSINGS) ×2 IMPLANT
SUT ETHILON 2 0 FS 18 (SUTURE) IMPLANT
SUT MNCRL AB 4-0 PS2 18 (SUTURE) ×2 IMPLANT
SUT MON AB 5-0 PS2 18 (SUTURE) ×1 IMPLANT
SUT SILK 2 0 SH (SUTURE) ×1 IMPLANT
SUT VIC AB 2-0 SH 27 (SUTURE) ×6
SUT VIC AB 2-0 SH 27XBRD (SUTURE) ×1 IMPLANT
SUT VIC AB 3-0 SH 27 (SUTURE) ×4
SUT VIC AB 3-0 SH 27X BRD (SUTURE) ×1 IMPLANT
SUT VIC AB 5-0 PS2 18 (SUTURE) IMPLANT
SYR CONTROL 10ML LL (SYRINGE) ×2 IMPLANT
TOWEL GREEN STERILE FF (TOWEL DISPOSABLE) ×2 IMPLANT
TRAY FAXITRON CT DISP (TRAY / TRAY PROCEDURE) ×2 IMPLANT
TUBE CONNECTING 20X1/4 (TUBING) IMPLANT
YANKAUER SUCT BULB TIP NO VENT (SUCTIONS) IMPLANT

## 2020-10-12 NOTE — Interval H&P Note (Signed)
History and Physical Interval Note:  10/12/2020 10:56 AM  [facility]  has presented today for surgery, with the diagnosis of RIGHT BREAST CANCER.  The various methods of treatment have been discussed with the patient and family. After consideration of risks, benefits and other options for treatment, the patient has consented to  Procedure(s): RIGHT BREAST LUMPECTOMY X 2 WITH RADIOACTIVE SEED AND SENTINEL LYMPH NODE BIOPSY (Right) as a surgical intervention.  The patient's history has been reviewed, patient examined, no change in status, stable for surgery.  I have reviewed the patient's chart and labs.  Questions were answered to the patient's satisfaction.     Rolm Bookbinder

## 2020-10-12 NOTE — H&P (Signed)
Linda [name] is an 74 y.o. female.   Chief Complaint: breast cancer HPI: 51 yof who underwent right breast lumpectomy for er/pr pos dcis in 2015. she underwent adjuvant radiotherapy and did antiestrogens. she had genetic testing that was normal. mm shows c density breasts. the right side shows only postop changes. the left side shows subtle area of distortion. US shows no mass. she also went mri due to lifetime risk of breast cancer that shows nl right side, nl nodes and on the left side shows a 1.3x3.6x1.9 cm mass correlating to mm distortion. There is nme that extends from nipple to posterior breast measuring 3.2x6.4 cm. biopsy of anterior/posterior extent of enhancement as well as mass were all done. the stereo biopsied lesion is a csl with alh. the mri biopsies are csls and one also has alh. I had discussed case with radiology and we have decided to proceed with just seed guided excision of the mm distortion/csl/alh. I took her to or and did seed guided excision of the stereo mass. on specimen mm the coil and cylinder shaped clip are both present. the final path is csl with some alh. she has been doing well. she has undergone follow up since then. she has a known csl on the left side that has been stable that we elected to follow. her most recent mm in 3/20 shows a simple cyst and otherwise postoperative changes. MRI was done 10/20 shows right breast negative, all lymph nodes negative and on the left side it shows a persistent nme similar to prior that measures 2.9x1.4x2.4cm. this is not really changed. she is referred back by oncology to discuss excision. I have discussed previously with radiology and elected to follow this. MM 3/21 negative. she was doing MRI in follow up for left side that shows no difference in the left side, masslike enhancement deep to medial right lumpectomy measuring 1 cm, inferior and lateral in the right breast there is an 8 mm mass. biopsy of the medial mass  is IDC/DCIS, grade II, er pos at 95%, pr pos at 30%, her 2 insufficient to test. the rloq mass is dcis that is er/pr pos. she is here to discuss options   Past Medical History:  Diagnosis Date  . Anxiety   . Arthritis   . Atrophic vaginitis   . Breast cancer ([place])    right (DCIS) s/p lumpectomy 7/15  . Cataract    BILATERAL  . Cervical dysplasia 1975  . DCIS (ductal carcinoma in situ)    RIGHT  . Depression   . Dysrhythmia    PVCs  . Family history of adverse reaction to anesthesia    Pt stated that her mother had difficulty waking up after anesthesia  . Family history of breast cancer   . Family history of colon cancer   . Family history of prostate cancer   . Hx of radiation therapy 08/02/14- 08/24/14   right reast 4256 cGy i 16 sessions, no boost  . Hypercholesteremia   . Insomnia   . Osteopenia 11/2011   T score -1.7 FRAX not calculated due to history of bisphosphonates and current Evista  . Personal history of radiation therapy 2015   right side  . PVC's (premature ventricular contractions)    PMH  . Seasonal allergies   . Skin cancer 2006   basal cell of face    Past Surgical History:  Procedure Laterality Date  . APPENDECTOMY  1955  . BREAST BIOPSY  2010  rt bx  . BREAST BIOPSY  2009   rt  . BREAST EXCISIONAL BIOPSY Left 2018   ALH & COMPLEX SCLEROSING LESION  . BREAST LUMPECTOMY  1983   BENIGN-rt  . BREAST LUMPECTOMY Right 2015   dcis  . BREAST LUMPECTOMY WITH RADIOACTIVE SEED LOCALIZATION Right 06/13/2014   Procedure: RIGHT BREAST  RADIOACTIVE SEED GUIDED LUMPECTOMY;  Surgeon: Rolm Bookbinder, MD;  Location: [facility];  Service: General;  Laterality: Right;for DCIS  . CATARACT EXTRACTION     Both eyes  . cataract surgery  2005  . COLONOSCOPY    . COLPOSCOPY    . EXCISION OF BASAL CELL CA FROM [facility]  2006  . FACELIFT  2014  . FACIAL COSMETIC SURGERY    . GYNECOLOGIC CRYOSURGERY    . KNEE SURGERY  2013   Arthroscopic-rt  .  RADIOACTIVE SEED GUIDED EXCISIONAL BREAST BIOPSY Left 05/07/2017   Procedure: LEFT RADIOACTIVE SEED GUIDED EXCISIONAL BREAST BIOPSY;  Surgeon: Rolm Bookbinder, MD;  Location: [facility];  Service: General;  Laterality: Left;  . REPOSITION OF LENS Right 10/01/2018   Procedure: REPOSITION OF LENS RIGHT EYE;  Surgeon: Jalene Mullet, MD;  Location: [facility];  Service: Ophthalmology;  Laterality: Right;    Family History  Problem Relation Age of Onset  . Breast cancer Mother        DIAGNOSED IN HER 80'S  . Hyperlipidemia Mother   . Osteoporosis Mother   . Breast cancer Sister        DIAGNOSED AT AGE 40  . Hypertension Maternal Grandmother   . Breast cancer Maternal Grandmother        possibly breast cancer; had mastectomy in her 30s-40s, but don't know if it was due to cancer  . Heart disease Maternal Grandfather   . Stroke Maternal Grandfather   . Diabetes Son        TYPE 1 DIABETES  . Colon cancer Paternal Grandfather   . Cancer Paternal Grandfather        colon  . Heart failure Son   . Kidney cancer Paternal Grandmother        bladder/kidney cancer  . Prostate cancer Maternal Uncle    Social History:  reports that she quit smoking about 46 years ago. She has never used smokeless tobacco. She reports current alcohol use of about 5.0 standard drinks of alcohol per week. She reports that she does not use drugs.  Allergies:  Allergies  Allergen Reactions  . Other Other (See Comments)    ? Seasonal allergies - classic symptoms    Medications Prior to Admission  Medication Sig Dispense Refill  . acetaminophen (TYLENOL) 500 MG tablet Take 1,000 mg by mouth every 6 (six) hours as needed.    . ALPRAZolam (XANAX) 0.5 MG tablet Take 0.5 tablets (0.25 mg total) by mouth 2 (two) times daily as needed for anxiety or sleep. 60 tablet 1  . Ascorbic Acid (VITAMIN C) 1000 MG tablet Take 1,000 mg by mouth 2 (two) times daily.    Marland Kitchen b complex vitamins tablet Take 1 tablet by mouth  at bedtime.    . busPIRone (BUSPAR) 10 MG tablet TAKE 1 TABLET(10 MG) BY MOUTH THREE TIMES DAILY (Patient taking differently: Take 10 mg by mouth 3 (three) times daily. ) 270 tablet 0  . calcium-vitamin D 250-100 MG-UNIT tablet Take 1 tablet by mouth 2 (two) times daily.    Marland Kitchen conjugated estrogens (PREMARIN) vaginal cream Place 1 Applicatorful vaginally 3 (three)  times a week. (Patient taking differently: Place 1 Applicatorful vaginally 2 (two) times a week. )    . gabapentin (NEURONTIN) 300 MG capsule Take 300 mg by mouth 3 (three) times daily.     . Multiple Vitamin (MULTIVITAMIN WITH MINERALS) TABS tablet Take 1 tablet by mouth at bedtime.    . rosuvastatin (CRESTOR) 10 MG tablet Take 1 tablet (10 mg total) by mouth daily. (Patient taking differently: Take 10 mg by mouth at bedtime. )    . Vilazodone HCl (VIIBRYD) 20 MG TABS Take 1 tablet (20 mg total) by mouth daily. 30 tablet 1  . zolpidem (AMBIEN) 5 MG tablet Take 2.5 mg by mouth at bedtime.     . hydrocortisone (ANUSOL-HC) 2.5 % rectal cream Place 1 application rectally daily as needed for hemorrhoids or anal itching.    . naproxen sodium (ALEVE) 220 MG tablet Take 440 mg by mouth 2 (two) times daily as needed (for pain.). (Patient not taking: Reported on 09/25/2020)    . RENOVA 0.02 % CREA Apply 1 application topically 3 (three) times a week.  (Patient not taking: Reported on 09/25/2020)  2    No results found for this or any previous visit (from the past 48 hour(s)). NM Sentinel Node Inj-No Rpt (Breast)  Result Date: 10/12/2020 Sulfur colloid was injected by the nuclear medicine technologist for melanoma sentinel node.   MM RT RADIOACTIVE SEED LOC MAMMO GUIDE  Result Date: 10/11/2020 CLINICAL DATA:  74 year old female presenting for radioactive seed localization of 2 sites in the right breast prior to lumpectomy. EXAM: MAMMOGRAPHIC GUIDED RADIOACTIVE SEED LOCALIZATION OF THE RIGHT BREAST COMPARISON:  Previous exam(s). FINDINGS: Patient  presents for radioactive seed localization prior to right breast lumpectomy x2. I met with the patient and we discussed the procedure of seed localization including benefits and alternatives. We discussed the high likelihood of a successful procedure. We discussed the risks of the procedure including infection, bleeding, tissue injury and further surgery. We discussed the low dose of radioactivity involved in the procedure. Informed, written consent was given. The usual time-out protocol was performed immediately prior to the procedure. Using mammographic guidance, sterile technique, 1% lidocaine and an I-125 radioactive seed, the cylinder shaped biopsy marking clip in the lower outer right breast was localized using a lateral approach. The follow-up mammogram images confirm the seed in the expected location and were marked for Dr. Donne Hazel. Follow-up survey of the patient confirms presence of the radioactive seed. Order number of I-125 seed:  [id number]. Total activity:  8.938 millicuries reference Date: 09/19/2020 ------------------------------------------------- Using mammographic guidance, sterile technique, 1% lidocaine and an I-125 radioactive seed, the dumbbell-shaped biopsy marking clip in the retroareolar right breast was localized using a superior approach. The follow-up mammogram images confirm the seed in the expected location and were marked for Dr. Donne Hazel. Follow-up survey of the patient confirms presence of the radioactive seed. Order number of I-125 seed:  [id number]. Total activity:  8.527 millicuries reference Date: 09/19/2020 The patient tolerated the procedure well and was released from the [facility]. She was given instructions regarding seed removal. IMPRESSION: Radioactive seed localization right breast x2. No apparent complications. Electronically Signed   By: Ammie Ferrier M.D.   On: 10/11/2020 14:26   MM RT RADIO SEED EA ADD LESION LOC MAMMO  Result Date: 10/11/2020 CLINICAL  DATA:  74 year old female presenting for radioactive seed localization of 2 sites in the right breast prior to lumpectomy. EXAM: MAMMOGRAPHIC GUIDED RADIOACTIVE SEED LOCALIZATION OF THE RIGHT BREAST  COMPARISON:  Previous exam(s). FINDINGS: Patient presents for radioactive seed localization prior to right breast lumpectomy x2. I met with the patient and we discussed the procedure of seed localization including benefits and alternatives. We discussed the high likelihood of a successful procedure. We discussed the risks of the procedure including infection, bleeding, tissue injury and further surgery. We discussed the low dose of radioactivity involved in the procedure. Informed, written consent was given. The usual time-out protocol was performed immediately prior to the procedure. Using mammographic guidance, sterile technique, 1% lidocaine and an I-125 radioactive seed, the cylinder shaped biopsy marking clip in the lower outer right breast was localized using a lateral approach. The follow-up mammogram images confirm the seed in the expected location and were marked for Dr. Donne Hazel. Follow-up survey of the patient confirms presence of the radioactive seed. Order number of I-125 seed:  [id number]. Total activity:  8.416 millicuries reference Date: 09/19/2020 ------------------------------------------------- Using mammographic guidance, sterile technique, 1% lidocaine and an I-125 radioactive seed, the dumbbell-shaped biopsy marking clip in the retroareolar right breast was localized using a superior approach. The follow-up mammogram images confirm the seed in the expected location and were marked for Dr. Donne Hazel. Follow-up survey of the patient confirms presence of the radioactive seed. Order number of I-125 seed:  [id number]. Total activity:  0.932 millicuries reference Date: 09/19/2020 The patient tolerated the procedure well and was released from the [facility]. She was given instructions regarding seed  removal. IMPRESSION: Radioactive seed localization right breast x2. No apparent complications. Electronically Signed   By: Ammie Ferrier M.D.   On: 10/11/2020 14:26    Review of Systems  All other systems reviewed and are negative.   Blood pressure 111/71, pulse 62, temperature 97.8 F (36.6 C), temperature source Oral, resp. rate 20, height 5' 7"  (1.702 m), weight 69.8 kg, SpO2 98 %. Physical Exam  Physical Exam Rolm Bookbinder MD; 09/13/2020 2:39 PM) General Mental Status-Alert. Breast Nipples-No Discharge. Breast Lump-No Palpable Breast Mass. Lymphatic Head & Neck General Head & Neck Lymphatics: Bilateral - Description - Normal. Axillary General Axillary Region: Bilateral - Description - Normal. Note: no Loris adenopathy  Assessment/Plan BREAST CANCER, STAGE 1, RIGHT (C50.911)  Right breast seed guided lumpectomy times two, right ax sn biopsy We discussed the staging and pathophysiology of breast cancer. We discussed all of the different options for treatment for breast cancer including surgery, chemotherapy, radiation therapy, Herceptin, and antiestrogen therapy. We discussed a sentinel lymph node biopsy as she does not appear to having lymph node involvement right now. We discussed the performance of that with injection of radioactive tracer. Even though over 73 with er pos the two areas and her functional status with grade II tumor I think reasonable to do sn biopsy. she agrees. We discussed up to a 5% risk lifetime of chronic shoulder pain as well as lymphedema. We discussed the options for treatment of the breast cancer which included lumpectomy versus a mastectomy. We discussed the performance of a double lumpectomy with radioactive seed placement. I confirmed with radiology that these are indeed two discrete separate lesions. We discussed a 5-10% chance of a positive margin requiring reexcision in the operating room. We discussed mastectomy and the postoperative care  for that as well. Mastectomy can be followed by reconstruction. The decision for lumpectomy vs mastectomy has no impact on decision for chemotherapy. We discussed that there is no difference in her survival whether she undergoes lumpectomy with antiestrogen therapy versus a mastectomy. survival same, local recurrence  somewhat higher. she would like to try to do lumpectomies, I will have her see med onc soon as well. she was discussed at conference and all thought this was reasonable strategy. We discussed the risks of operation including bleeding, infection, possible reoperation. She understands her further therapy will be based on what her stages at the time of her operation.  Rolm Bookbinder, MD 10/12/2020, 10:55 AM

## 2020-10-12 NOTE — Anesthesia Postprocedure Evaluation (Signed)
Anesthesia Post Note  Patient: Micky Overturf  Procedure(s) Performed: RIGHT BREAST LUMPECTOMY X 2 WITH RADIOACTIVE SEED AND SENTINEL LYMPH NODE BIOPSY (Right Breast)     Patient location during evaluation: PACU Anesthesia Type: General Level of consciousness: awake and alert Pain management: pain level controlled Vital Signs Assessment: post-procedure vital signs reviewed and stable Respiratory status: spontaneous breathing, nonlabored ventilation and respiratory function stable Cardiovascular status: blood pressure returned to baseline and stable Postop Assessment: no apparent nausea or vomiting Anesthetic complications: no   No complications documented.  Last Vitals:  Vitals:   10/12/20 1241 10/12/20 1245  BP: 122/65 123/68  Pulse: 81 79  Resp: 16 14  Temp:  (!) 36.3 C  SpO2: 95% 95%    Last Pain:  Vitals:   10/12/20 1309  TempSrc:   PainSc: [place]

## 2020-10-12 NOTE — Anesthesia Procedure Notes (Signed)
Anesthesia Regional Block: Pectoralis block   Pre-Anesthetic Checklist: ,, timeout performed, Correct Patient, Correct Site, Correct Laterality, Correct Procedure, Correct Position, site marked, Risks and benefits discussed,  Surgical consent,  Pre-op evaluation,  At surgeon's request and post-op pain management  Laterality: Right  Prep: chloraprep       Needles:  Injection technique: Single-shot  Needle Type: Stimiplex     Needle Length: 9cm  Needle Gauge: 21     Additional Needles:   Procedures:,,,, ultrasound used (permanent image in chart),,,,  Narrative:  Start time: 10/12/2020 10:08 AM End time: 10/12/2020 10:13 AM Injection made incrementally with aspirations every 5 mL.  Performed by: Personally  Anesthesiologist: Lynda Rainwater, MD

## 2020-10-12 NOTE — Op Note (Signed)
Preoperative diagnosis clinical stage I right breast cancer status post prior breast conservation therapy Postoperative diagnosis: Same as above Procedure: 1.  Right breast radioactive seed guided lumpectomy x2 2.  Right deep axillary sentinel lymph node biopsy Surgeon: Dr. Serita Grammes Anesthesia: General with pectoral block Estimated blood loss: 20 cc Complications: None Drains: None Specimens: 1.  Right breast retroareolar lumpectomy marked with paint 2.  Additional medial right breast retroareolar lumpectomy margin marked short superior, long lateral, double deep 3.  Right breast lower outer lumpectomy marked with paint, the superior margin of this is the inferior margin of the other lumpectomy 4.  Right deep axillary sentinel lymph node biopsy with highest count of 38 Special count was correct completion Disposition recovery stable condition  Indications:74 yof who underwent right breast lumpectomy for er/pr pos dcis in 2015. she underwent adjuvant radiotherapy and did antiestrogens. she had genetic testing that was normal. mm shows c density breasts. the right side shows only postop changes. the left side shows subtle area of distortion. US shows no mass. she also went mri due to lifetime risk of breast cancer that shows nl right side, nl nodes and on the left side shows a 1.3x3.6x1.9 cm mass correlating to mm distortion. There is nme that extends from nipple to posterior breast measuring 3.2x6.4 cm. biopsy of anterior/posterior extent of enhancement as well as mass were all done. the stereo biopsied lesion is a csl with alh. the mri biopsies are csls and one also has alh. I had discussed case with radiology and we have decided to proceed with just seed guided excision of the mm distortion/csl/alh. I took her to or and did seed guided excision of the stereo mass. on specimen mm the coil and cylinder shaped clip are both present. the final path is csl with some alh. she has  been doing well. she has undergone follow up since then. she has a known csl on the left side that has been stable that we elected to follow. her most recent mm in 3/20 shows a simple cyst and otherwise postoperative changes. MRI was done 10/20 shows right breast negative, all lymph nodes negative and on the left side it shows a persistent nme similar to prior that measures 2.9x1.4x2.4cm. this is not really changed. she is referred back by oncology to discuss excision. I have discussed previously with radiology and elected to follow this. MM 3/21 negative. she was doing MRI in follow up for left side that shows no difference in the left side, masslike enhancement deep to medial right lumpectomy measuring 1 cm, inferior and lateral in the right breast there is an 8 mm mass. biopsy of the medial mass is IDC/DCIS, grade II, er pos at 95%, pr pos at 30%, her 2 insufficient to test. the rloq mass is dcis that is er/pr pos. after discussions of her options as well as in a multidisciplinary fashion we elected to do 2 seed guided lumpectomies as well as a sentinel lymph node biopsy due to her desire to prefer to preserve her breast.  Procedure: After informed consent was obtained the patient was taken to the operating room.  She underwent a pectoral block.  She was injected with Lymphoseek in the standard periareolar fashion.  Antibiotics were given.  SCDs were in place.  She was placed under general anesthesia with an LMA.  She was prepped and draped in the standard sterile surgical fashion.  Surgical timeout was then performed.  The first seed was retroareolar the second  was in the lower outer quadrant.  I did these both through the same incision.  Infiltrated Marcaine and made a periareolar incision in order to hide the scar later.  I remove the retroareolar seed and cancer first.  This was done with an attempt to get a clear margin.  The anterior margin is the nipple areola and the posterior margin is the  muscle.  I confirmed removal of the seed and the clip.  I took some additional medial margin as marked as above.  Once I had done this the other seed was really inferior to this.  So the superior margin of the second specimen is the inferior margin of the first.  I then excised the second seed with an attempt to get a clear margin.  Mammogram confirmed the removal and it appeared all my margins were clear on 3D imaging.  I then obtained hemostasis.  I mobilized the breast tissue and closed with 2-0 Vicryl.  The dermis was closed with 3-0 Vicryl and the skin was closed with 5-0 Monocryl.  I then identified the sentinel node in the low axilla.  The counts were not very high but there clearly was radioactivity present.  I made incision and carried this through the axillary fascia.  I then identified what appeared to be a small packet of lymph nodes that were normal to my eyes.  I remove these and there was no background radioactivity.  The counts were not very high though.  There were no abnormal lymph nodes that were identified.  I then obtained hemostasis.  I closed this with 2-0 Vicryl, 3-0 Vicryl, and 4-0 Monocryl.  Glue and Steri-Strips were applied.  A binder was placed.  She tolerated this well was extubated and transferred to recovery in stable condition.

## 2020-10-12 NOTE — Transfer of Care (Signed)
Immediate Anesthesia Transfer of Care Note  Patient: Linda [name]  Procedure(s) Performed: RIGHT BREAST LUMPECTOMY X 2 WITH RADIOACTIVE SEED AND SENTINEL LYMPH NODE BIOPSY (Right Breast)  Patient Location: PACU  Anesthesia Type:GA combined with regional for post-op pain  Level of Consciousness: awake, alert  and oriented  Airway & Oxygen Therapy: Patient Spontanous Breathing and Patient connected to face mask oxygen  Post-op Assessment: Report given to RN and Post -op Vital signs reviewed and stable  Post vital signs: Reviewed and stable  Last Vitals:  Vitals Value Taken Time  BP    Temp    Pulse 88 10/12/20 1220  Resp 18 10/12/20 1220  SpO2 100 % 10/12/20 1220    Last Pain:  Vitals:   10/12/20 0932  TempSrc: Oral  PainSc: 0-No pain      Patients Stated Pain Goal: 4 (50/51/83 3582)  Complications: No complications documented.

## 2020-10-12 NOTE — Progress Notes (Signed)
Assisted Dr. Miller with right, ultrasound guided, pectoralis block. Side rails up, monitors on throughout procedure. See vital signs in flow sheet. Tolerated Procedure well. 

## 2020-10-12 NOTE — Discharge Instructions (Signed)
Next dose of Tylenol can be given after 2:00PM.   Post Anesthesia Home Care Instructions  Activity: Get plenty of rest for the remainder of the day. A responsible individual must stay with you for 24 hours following the procedure.  For the next 24 hours, DO NOT: -Drive a car -Paediatric nurse -Drink alcoholic beverages -Take any medication unless instructed by your physician -Make any legal decisions or sign important papers.  Meals: Start with liquid foods such as gelatin or soup. Progress to regular foods as tolerated. Avoid greasy, spicy, heavy foods. If nausea and/or vomiting occur, drink only clear liquids until the nausea and/or vomiting subsides. Call your physician if vomiting continues.  Special Instructions/Symptoms: Your throat may feel dry or sore from the anesthesia or the breathing tube placed in your throat during surgery. If this causes discomfort, gargle with warm salt water. The discomfort should disappear within 24 hours.  If you had a scopolamine patch placed behind your ear for the management of post- operative nausea and/or vomiting:  1. The medication in the patch is effective for 72 hours, after which it should be removed.  Wrap patch in a tissue and discard in the trash. Wash hands thoroughly with soap and water. 2. You may remove the patch earlier than 72 hours if you experience unpleasant side effects which may include dry mouth, dizziness or visual disturbances. 3. Avoid touching the patch. Wash your hands with soap and water after contact with the patch.       [place] Office Phone Number [phone]  BREAST BIOPSY/ PARTIAL MASTECTOMY: POST OP INSTRUCTIONS Take 400 mg of ibuprofen every 8 hours or 650 mg tylenol every 6 hours for next 72 hours then as needed. Use ice several times daily also. Always review your discharge instruction sheet given to you by the facility where your surgery was performed.  IF YOU HAVE DISABILITY OR  FAMILY LEAVE FORMS, YOU MUST BRING THEM TO THE OFFICE FOR PROCESSING.  DO NOT GIVE THEM TO YOUR DOCTOR.  1. A prescription for pain medication may be given to you upon discharge.  Take your pain medication as prescribed, if needed.  If narcotic pain medicine is not needed, then you may take acetaminophen (Tylenol), naprosyn (Alleve) or ibuprofen (Advil) as needed. 2. Take your usually prescribed medications unless otherwise directed 3. If you need a refill on your pain medication, please contact your pharmacy.  They will contact our office to request authorization.  Prescriptions will not be filled after 5pm or on week-ends. 4. You should eat very light the first 24 hours after surgery, such as soup, crackers, pudding, etc.  Resume your normal diet the day after surgery. 5. Most patients will experience some swelling and bruising in the breast.  Ice packs and a good support bra will help.  Wear the breast binder provided or a sports bra for 72 hours day and night.  After that wear a sports bra during the day until you return to the office. Swelling and bruising can take several days to resolve.  6. It is common to experience some constipation if taking pain medication after surgery.  Increasing fluid intake and taking a stool softener will usually help or prevent this problem from occurring.  A mild laxative (Milk of Magnesia or Miralax) should be taken according to package directions if there are no bowel movements after 48 hours. 7. Unless discharge instructions indicate otherwise, you may remove your bandages 48 hours after surgery and you may shower  at that time.  You may have steri-strips (small skin tapes) in place directly over the incision.  These strips should be left on the skin for 7-10 days and will come off on their own.  If your surgeon used skin glue on the incision, you may shower in 24 hours.  The glue will flake off over the next 2-3 weeks.  Any sutures or staples will be removed at the  office during your follow-up visit. 8. ACTIVITIES:  You may resume regular daily activities (gradually increasing) beginning the next day.  Wearing a good support bra or sports bra minimizes pain and swelling.  You may have sexual intercourse when it is comfortable. a. You may drive when you no longer are taking prescription pain medication, you can comfortably wear a seatbelt, and you can safely maneuver your car and apply brakes. b. RETURN TO WORK:  ______________________________________________________________________________________ 9. You should see your doctor in the office for a follow-up appointment approximately two weeks after your surgery.  Your doctor's nurse will typically make your follow-up appointment when she calls you with your pathology report.  Expect your pathology report 3-4 business days after your surgery.  You may call to check if you do not hear from [place] after three days. 10. OTHER INSTRUCTIONS: _______________________________________________________________________________________________ _____________________________________________________________________________________________________________________________________ _____________________________________________________________________________________________________________________________________ _____________________________________________________________________________________________________________________________________  WHEN TO CALL DR WAKEFIELD: 1. Fever over 101.0 2. Nausea and/or vomiting. 3. Extreme swelling or bruising. 4. Continued bleeding from incision. 5. Increased pain, redness, or drainage from the incision.  The clinic staff is available to answer your questions during regular business hours.  Please don't hesitate to call and ask to speak to one of the nurses for clinical concerns.  If you have a medical emergency, go to the nearest emergency room or call 911.  A surgeon from [facility] is always on call at the hospital.  For further questions, please visit [email or web address]

## 2020-10-12 NOTE — Anesthesia Procedure Notes (Signed)
Procedure Name: LMA Insertion Date/Time: 10/12/2020 11:19 AM Performed by: [place] Thanvi Blincoe C, CRNA Pre-anesthesia Checklist: Patient identified, Emergency Drugs available, Suction available and Patient being monitored Patient Re-evaluated:Patient Re-evaluated prior to induction Oxygen Delivery Method: Circle system utilized Preoxygenation: Pre-oxygenation with 100% oxygen Induction Type: IV induction Ventilation: Mask ventilation without difficulty LMA: LMA inserted LMA Size: 4.0 Number of attempts: 1 Airway Equipment and Method: Bite block Placement Confirmation: positive ETCO2 Tube secured with: Tape Dental Injury: Teeth and Oropharynx as per pre-operative assessment

## 2020-10-12 NOTE — Anesthesia Preprocedure Evaluation (Signed)
Anesthesia Evaluation  Patient identified by MRN, date of birth, ID band Patient awake    Reviewed: Allergy & Precautions, NPO status , Patient's Chart, lab work & pertinent test results  Airway Mallampati: I       Dental no notable dental hx. (+) Teeth Intact   Pulmonary former smoker,    Pulmonary exam normal breath sounds clear to auscultation       Cardiovascular negative cardio ROS Normal cardiovascular exam Rhythm:Regular Rate:Normal     Neuro/Psych PSYCHIATRIC DISORDERS Anxiety Depression    GI/Hepatic negative GI ROS, Neg liver ROS,   Endo/Other  negative endocrine ROS  Renal/GU negative Renal ROS     Musculoskeletal  (+) Arthritis , Osteoarthritis,    Abdominal Normal abdominal exam  (+)   Peds  Hematology   Anesthesia Other Findings Breast Cancer  Reproductive/Obstetrics                             Anesthesia Physical  Anesthesia Plan  ASA: III  Anesthesia Plan: General   Post-op Pain Management:  Regional for Post-op pain   Induction: Intravenous  PONV Risk Score and Plan: 3 and Ondansetron, Dexamethasone, Midazolam and Treatment may vary due to age or medical condition  Airway Management Planned: LMA  Additional Equipment:   Intra-op Plan:   Post-operative Plan: Extubation in OR  Informed Consent: I have reviewed the patients History and Physical, chart, labs and discussed the procedure including the risks, benefits and alternatives for the proposed anesthesia with the patient or authorized representative who has indicated his/her understanding and acceptance.     Dental advisory given  Plan Discussed with: CRNA  Anesthesia Plan Comments:         Anesthesia Quick Evaluation

## 2020-10-15 ENCOUNTER — Ambulatory Visit: Payer: PPO | Admitting: Hematology and Oncology

## 2020-10-15 ENCOUNTER — Encounter (HOSPITAL_BASED_OUTPATIENT_CLINIC_OR_DEPARTMENT_OTHER): Payer: Self-pay | Admitting: General Surgery

## 2020-10-16 ENCOUNTER — Other Ambulatory Visit: Payer: Self-pay

## 2020-10-16 ENCOUNTER — Ambulatory Visit (INDEPENDENT_AMBULATORY_CARE_PROVIDER_SITE_OTHER): Payer: PPO | Admitting: Psychiatry

## 2020-10-16 ENCOUNTER — Encounter: Payer: Self-pay | Admitting: Psychiatry

## 2020-10-16 ENCOUNTER — Other Ambulatory Visit: Payer: Self-pay | Admitting: Psychiatry

## 2020-10-16 DIAGNOSIS — F411 Generalized anxiety disorder: Secondary | ICD-10-CM

## 2020-10-16 MED ORDER — VIIBRYD 40 MG PO TABS: 40.0000 mg | ORAL_TABLET | Freq: Every day | ORAL | 1 refills | Status: DC

## 2020-10-16 NOTE — Patient Instructions (Signed)
Increase Viibryd to 30 mg daily for 2 weeks then increase to 40 mg daily for anxiety

## 2020-10-16 NOTE — Progress Notes (Signed)
[name] [id number] 26-Aug-1946 74 y.o.  Subjective:   Patient ID:  Linda [name] is a 74 y.o. (DOB 01-22-1946) female.  Chief Complaint:  Chief Complaint  Patient presents with  . Follow-up  . Anxiety    HPI Linda [name] presents to the office today for follow-up of generalized anxiety disorder. First seen February 21, 2020 Lexapro 5 mg was added to buspirone.  She continued Ambien 5 mg prn.  05/02/2020 appointment the following is noted:  Still anxious without change with Lexapro.  No SE.  Asks about genetic testing.   Takes Xanax only about weekly but wants to continue it.  Ambien 2.5 mg regularluy. Plan: If increase Lexapro does not help then do Genesight testing. Continue Lexapro 10 mg daily Continue buspirone 10 mg 3 times daily Gabapentin 300 TID Xanax 0.5 mg twice daily as needed anxiety.  06/28/2120 appt with the following noted: Doesn't think much difference with Lexapro 10.  Initially sleepy but it resolved.  Chronic worry and can't stop my head.  No SE. Wonders about [employer] Needs Ambien 2.5 mg HS to sleep. Still on others noted and wants better benefit.  Not depressed. Patient reports stable mood and denies depressed or irritable moods.With meds Patient denies difficulty with sleep initiation or maintenance. Denies appetite disturbance.  Patient reports that energy and motivation have been good.  Patient denies any difficulty with concentration.  Patient denies any suicidal ideation. Plan: Rec increase  Lexapro 15 mg daily Continue buspirone 10 mg 3 times daily Xanax 0.5 mg twice daily as needed anxiety. Gensight testing  08/29/20 appt noted: No particular improvements with incrase in Lexapro and is a little more sleepy. Anxiety is about 7/10.  Not depressed. Switch Lexapro to Viibryd 20. Continue buspirone 10 mg 3 times daily Xanax 0.5 mg twice daily as needed anxiety.  10/16/20 appt with following noted: Sleepiness better off Lexapro 15.   No SE or benefit with Viibryd. No depressed.  Anxiety 5/10.   Sleep is good.    2 sons responded to Wellbutrin and asks about it.  Past Psychiatric History:  Therapy Alvester Chou. prescriber Pauline Good & PCP Past Psychiatric Medication Trials:  Buspirone 10 TID NR but a little lightheaded Lexapro 15 sleepy Gabapentin  About 5 years ago tried citalopram briefly No Zoloft, paxil, duloxetine, fluoxetine.    Review of Systems:  Review of Systems  Constitutional: Negative for unexpected weight change.  Gastrointestinal: Negative for abdominal pain, constipation, diarrhea and nausea.  Neurological: Negative for dizziness, tremors and weakness.    Medications: I have reviewed the patient's current medications.  Current Outpatient Medications  Medication Sig Dispense Refill  . acetaminophen (TYLENOL) 500 MG tablet Take 1,000 mg by mouth every 6 (six) hours as needed.    . ALPRAZolam (XANAX) 0.5 MG tablet Take 0.5 tablets (0.25 mg total) by mouth 2 (two) times daily as needed for anxiety or sleep. 60 tablet 1  . Ascorbic Acid (VITAMIN C) 1000 MG tablet Take 1,000 mg by mouth 2 (two) times daily.    Marland Kitchen b complex vitamins tablet Take 1 tablet by mouth at bedtime.    . busPIRone (BUSPAR) 10 MG tablet TAKE 1 TABLET(10 MG) BY MOUTH THREE TIMES DAILY (Patient taking differently: Take 10 mg by mouth 3 (three) times daily. ) 270 tablet 0  . calcium-vitamin D 250-100 MG-UNIT tablet Take 1 tablet by mouth 2 (two) times daily.    Marland Kitchen conjugated estrogens (PREMARIN) vaginal cream Place 1 Applicatorful vaginally 3 (  three) times a week. (Patient taking differently: Place 1 Applicatorful vaginally 2 (two) times a week. )    . gabapentin (NEURONTIN) 300 MG capsule Take 300 mg by mouth 3 (three) times daily.     . hydrocortisone (ANUSOL-HC) 2.5 % rectal cream Place 1 application rectally daily as needed for hemorrhoids or anal itching.    . Multiple Vitamin (MULTIVITAMIN WITH MINERALS) TABS tablet Take 1 tablet  by mouth at bedtime.    . naproxen sodium (ALEVE) 220 MG tablet Take 440 mg by mouth 2 (two) times daily as needed (for pain.).     Marland Kitchen oxyCODONE (OXY IR/ROXICODONE) 5 MG immediate release tablet Take 1 tablet (5 mg total) by mouth every 6 (six) hours as needed. 10 tablet 0  . rosuvastatin (CRESTOR) 10 MG tablet Take 1 tablet (10 mg total) by mouth daily. (Patient taking differently: Take 10 mg by mouth at bedtime. )    . Vilazodone HCl (VIIBRYD) 20 MG TABS Take 1 tablet (20 mg total) by mouth daily. 30 tablet 1  . zolpidem (AMBIEN) 5 MG tablet Take 2.5 mg by mouth at bedtime.      No current facility-administered medications for this visit.    Medication Side Effects: None  Allergies:  Allergies  Allergen Reactions  . Other Other (See Comments)    ? Seasonal allergies - classic symptoms    Past Medical History:  Diagnosis Date  . Anxiety   . Arthritis   . Atrophic vaginitis   . Breast cancer ([place])    right (DCIS) s/p lumpectomy 7/15  . Cataract    BILATERAL  . Cervical dysplasia 1975  . DCIS (ductal carcinoma in situ)    RIGHT  . Depression   . Dysrhythmia    PVCs  . Family history of adverse reaction to anesthesia    Pt stated that her mother had difficulty waking up after anesthesia  . Family history of breast cancer   . Family history of colon cancer   . Family history of prostate cancer   . Hx of radiation therapy 08/02/14- 08/24/14   right reast 4256 cGy i 16 sessions, no boost  . Hypercholesteremia   . Insomnia   . Osteopenia 11/2011   T score -1.7 FRAX not calculated due to history of bisphosphonates and current Evista  . Personal history of radiation therapy 2015   right side  . PVC's (premature ventricular contractions)    PMH  . Seasonal allergies   . Skin cancer 2006   basal cell of face    Family History  Problem Relation Age of Onset  . Breast cancer Mother        DIAGNOSED IN HER 80'S  . Hyperlipidemia Mother   . Osteoporosis Mother   . Breast  cancer Sister        DIAGNOSED AT AGE 66  . Hypertension Maternal Grandmother   . Breast cancer Maternal Grandmother        possibly breast cancer; had mastectomy in her 30s-40s, but don't know if it was due to cancer  . Heart disease Maternal Grandfather   . Stroke Maternal Grandfather   . Diabetes Son        TYPE 1 DIABETES  . Colon cancer Paternal Grandfather   . Cancer Paternal Grandfather        colon  . Heart failure Son   . Kidney cancer Paternal Grandmother        bladder/kidney cancer  . Prostate cancer Maternal Uncle  Social History   Socioeconomic History  . Marital status: Married    Spouse name: Not on file  . Number of children: Not on file  . Years of education: Not on file  . Highest education level: Not on file  Occupational History  . Not on file  Tobacco Use  . Smoking status: Former Smoker    Quit date: 05/19/1974    Years since quitting: 46.4  . Smokeless tobacco: Never Used  . Tobacco comment: smoked in college  Vaping Use  . Vaping Use: Never used  Substance and Sexual Activity  . Alcohol use: Yes    Alcohol/week: 5.0 standard drinks    Types: 5 Standard drinks or equivalent per week    Comment: daily ETOH cocktail nightly  . Drug use: No  . Sexual activity: Yes    Birth control/protection: Post-menopausal    Comment: G4 P4   Other Topics Concern  . Not on file  Social History Narrative   Pt lives in [place] with spouse.  4 children and 8 grandchildren.   House wife   Social Determinants of Health   Financial Resource Strain:   . Difficulty of Paying Living Expenses: Not on file  Food Insecurity:   . Worried About Charity fundraiser in the Last Year: Not on file  . Ran Out of Food in the Last Year: Not on file  Transportation Needs:   . Lack of Transportation (Medical): Not on file  . Lack of Transportation (Non-Medical): Not on file  Physical Activity:   . Days of Exercise per Week: Not on file  . Minutes of Exercise per  Session: Not on file  Stress:   . Feeling of Stress : Not on file  Social Connections:   . Frequency of Communication with Friends and Family: Not on file  . Frequency of Social Gatherings with Friends and Family: Not on file  . Attends Religious Services: Not on file  . Active Member of Clubs or Organizations: Not on file  . Attends Archivist Meetings: Not on file  . Marital Status: Not on file  Intimate Partner Violence:   . Fear of Current or Ex-Partner: Not on file  . Emotionally Abused: Not on file  . Physically Abused: Not on file  . Sexually Abused: Not on file    Past Medical History, Surgical history, Social history, and Family history were reviewed and updated as appropriate.   Please see review of systems for further details on the patient's review from today.   Objective:   Physical Exam:  There were no vitals taken for this visit.  Physical Exam Constitutional:      General: She is not in acute distress. Musculoskeletal:        General: No deformity.  Neurological:     Mental Status: She is alert and oriented to person, place, and time.     Coordination: Coordination normal.  Psychiatric:        Attention and Perception: Attention and perception normal. She does not perceive auditory or visual hallucinations.        Mood and Affect: Mood is anxious. Mood is not depressed. Affect is not labile, blunt, angry, tearful or inappropriate.        Speech: Speech normal. Speech is not slurred.        Behavior: Behavior normal.        Thought Content: Thought content normal. Thought content is not paranoid or delusional. Thought content does not include  homicidal or suicidal ideation. Thought content does not include homicidal or suicidal plan.        Cognition and Memory: Cognition and memory normal.        Judgment: Judgment normal.     Comments: Insight intact Pleasant Maybe anxiety a little better     Lab Review:     Component Value Date/Time   NA  140 10/01/2018 0628   NA 140 06/30/2017 0904   K 4.1 10/01/2018 0628   CL 110 10/01/2018 0628   CO2 25 10/01/2018 0628   GLUCOSE 90 10/01/2018 0628   BUN 17 10/01/2018 0628   BUN 23 06/30/2017 0904   CREATININE 0.94 10/01/2018 0628   CALCIUM 8.8 (L) 10/01/2018 0628   PROT 6.1 (L) 10/01/2018 0628   ALBUMIN 3.6 10/01/2018 0628   AST 23 10/01/2018 0628   ALT 18 10/01/2018 0628   ALKPHOS 31 (L) 10/01/2018 0628   BILITOT 0.6 10/01/2018 0628   GFRNONAA 59 (L) 10/01/2018 0628   GFRAA >60 10/01/2018 0628       Component Value Date/Time   WBC 5.1 09/26/2020 0942   RBC 4.10 09/26/2020 0942   HGB 13.7 09/26/2020 0942   HCT 42.4 09/26/2020 0942   PLT 231 09/26/2020 0942   MCV 103.4 (H) 09/26/2020 0942   MCH 33.4 09/26/2020 0942   MCHC 32.3 09/26/2020 0942   RDW 12.9 09/26/2020 0942   LYMPHSABS 1.6 06/08/2014 1330   MONOABS 0.6 06/08/2014 1330   EOSABS 0.2 06/08/2014 1330   BASOSABS 0.0 06/08/2014 1330    No results found for: POCLITH, LITHIUM   No results found for: PHENYTOIN, PHENOBARB, VALPROATE, CBMZ   .res Assessment: Plan:    Querida was seen today for follow-up and anxiety.  Diagnoses and all orders for this visit:  Generalized anxiety disorder  Discussed diagnosis and treatment plan.  Genesight testing results disc at length.  Suggest Viibryd.  Did genetic testing 08/29/20 per her request..  Again reviewed the results with her in some detail.  Increase Viibryd to 30 mg for 2 week then increase to 40 mg daily. Continue buspirone 10 mg 3 times daily Xanax 0.5 mg twice daily as needed anxiety.  Answered questions about Xanax use. We discussed the short-term risks associated with benzodiazepines including sedation and increased fall risk among others.  Discussed long-term side effect risk including dependence, potential withdrawal symptoms, and the potential eventual dose-related risk of dementia.  But recent studies from 2020 dispute this association between  benzodiazepines and dementia risk. Newer studies in 2020 do not support an association with dementia.  If Viibryd works wean buspirone.  FU 6-8 weeks.  Lynder Parents, MD, DFAPA   Please see After Visit Summary for patient specific instructions.  Future Appointments  Date Time Provider [place]  10/23/2020 11:30 AM Nicholas Lose, MD [facility] None  05/31/2021 11:30 AM Nicholas Lose, MD [facility] None    No orders of the defined types were placed in this encounter.   -------------------------------

## 2020-10-17 LAB — SURGICAL PATHOLOGY

## 2020-10-22 ENCOUNTER — Encounter: Payer: Self-pay | Admitting: *Deleted

## 2020-10-22 NOTE — Assessment & Plan Note (Signed)
Right breast DCIS ER/PR positive diagnosed March 2015 7 mm focus on MRI and alert lumpectomy 06/13/2014 2 mm focus of DCIS negative margins, ER 70%, PR 80% positive status post radiation therapy;   05/07/2017:Left lumpectomy: Complex sclerosing lesion with usual ductal hyperplasia, focal atypical lobular hyperplasia, no invasive cancer identified Adjuvant radiation therapy Prior treatment: Tamoxifen 20 mg [id number]- 2020  Osteopenia:Was previously on Boniva. Not taking it any further. Next bone density will be done in April 2020. Calcium and vitamin D Bone density 10 2015: T score -1.8. Bone Density July 2020: T score -1.9  Biopsy 09/04/2020: Right breast medial: IDC with DCIS grade 2, ER greater than 95%, PR 30%, Ki-67 20% HER-2 not done because of insufficient tissue; right breast LOQ: DCIS  Treatment plan: breast conserving surgery followed by anastrozole therapy.

## 2020-10-22 NOTE — Progress Notes (Signed)
Patient Care Team: Marton Redwood, MD as PCP - General (Internal Medicine) Thompson Grayer, MD as PCP - Cardiology (Cardiology) Mauro Kaufmann, RN as Oncology Nurse Navigator Rockwell Germany, RN as Oncology Nurse Navigator  DIAGNOSIS:    ICD-10-CM   1. Malignant neoplasm of lower-inner quadrant of right breast of female, estrogen receptor positive ([place])  C50.311    Z17.0     SUMMARY OF ONCOLOGIC HISTORY: Oncology History  Malignant neoplasm of lower-inner quadrant of right female breast ([place])  02/03/2014 Breast MRI   7 X 6X 5 mm right breast abnormality as 2 immediately adjacent 3 mm foci of enhancement   05/19/2014 Initial Diagnosis   Malignant neoplasm of lower-inner quadrant of female breast DCIS   06/13/2014 Surgery   R Lumpectomy DCIS 2 mm size Grade 1 Er 70%, PR 80%, Margins Neg   07/25/2014 - 08/25/2014 Radiation Therapy   Adjuvant radiation therapy   10/18/2014 Procedure   genetic testing was normal no mutations identified   05/07/2017 Surgery   Left lumpectomy: Complex sclerosing lesion with usual ductal hyperplasia, focal atypical lobular hyperplasia, no invasive cancer identified   08/10/2017 - 06/02/2019 Anti-estrogen oral therapy   Tamoxifen 20 mg daily x5 years   08/11/2018 Breast MRI   Persistent non-mass enhancement within the left LOQ at anterior depth, at the site of patient's earlier MRI-guided biopsy revealing complex sclerosing lesion, atypical lobular hyperplasia and pseudoangiomatous stromal hyperplasia. This non-mass enhancement is not significantly changed in extent compared to the previous MRI of 02/05/2017 (pre biopsy). No evidence of malignancy within right breast.      09/04/2020 Relapse/Recurrence   Right breast biopsy medial: IDC with DCIS, grade 2, ER greater than 95%, PR 30%, Ki-67 20%, insufficient tumor for HER-2 testing   10/12/2020 Surgery   Right lumpectomy Donne Hazel): intermediate grade DCIS, clear margins, 1 right axillary lymph node  negative for carcinoma.     CHIEF COMPLIANT: Follow-up s/p right lumpectomy   INTERVAL HISTORY: Linda [name] is a 74 y.o. with above-mentioned history of newly recurrent right breast cancer.  Right breast biopsy showed a 3 to 4 mm size invasive ductal carcinoma that was ER/PR positive and insufficient tumor for HER-2 testing.  She underwent a right lumpectomy on 10/12/20 with Dr. Donne Hazel for which pathology showed intermediate grade DCIS, clear margins, 1 right axillary lymph node negative for carcinoma. She presentsto the clinictoday to review the pathology report and discuss further treatment.   She has recovered very well from recent surgery.  ALLERGIES:  is allergic to other.  MEDICATIONS:  Current Outpatient Medications  Medication Sig Dispense Refill  . acetaminophen (TYLENOL) 500 MG tablet Take 1,000 mg by mouth every 6 (six) hours as needed.    . ALPRAZolam (XANAX) 0.5 MG tablet Take 0.5 tablets (0.25 mg total) by mouth 2 (two) times daily as needed for anxiety or sleep. 60 tablet 1  . anastrozole (ARIMIDEX) 1 MG tablet Take 1 tablet (1 mg total) by mouth daily. 90 tablet 3  . Ascorbic Acid (VITAMIN C) 1000 MG tablet Take 1,000 mg by mouth 2 (two) times daily.    Marland Kitchen b complex vitamins tablet Take 1 tablet by mouth at bedtime.    . busPIRone (BUSPAR) 10 MG tablet TAKE 1 TABLET(10 MG) BY MOUTH THREE TIMES DAILY (Patient taking differently: Take 10 mg by mouth 3 (three) times daily. ) 270 tablet 0  . calcium-vitamin D 250-100 MG-UNIT tablet Take 1 tablet by mouth 2 (two) times daily.    Marland Kitchen  conjugated estrogens (PREMARIN) vaginal cream Place 1 Applicatorful vaginally 3 (three) times a week. (Patient taking differently: Place 1 Applicatorful vaginally 2 (two) times a week. )    . gabapentin (NEURONTIN) 300 MG capsule Take 300 mg by mouth 3 (three) times daily.     . hydrocortisone (ANUSOL-HC) 2.5 % rectal cream Place 1 application rectally daily as needed for hemorrhoids or anal  itching.    . Multiple Vitamin (MULTIVITAMIN WITH MINERALS) TABS tablet Take 1 tablet by mouth at bedtime.    . naproxen sodium (ALEVE) 220 MG tablet Take 440 mg by mouth 2 (two) times daily as needed (for pain.).     Marland Kitchen oxyCODONE (OXY IR/ROXICODONE) 5 MG immediate release tablet Take 1 tablet (5 mg total) by mouth every 6 (six) hours as needed. 10 tablet 0  . rosuvastatin (CRESTOR) 10 MG tablet Take 1 tablet (10 mg total) by mouth daily. (Patient taking differently: Take 10 mg by mouth at bedtime. )    . Vilazodone HCl (VIIBRYD) 40 MG TABS Take 1 tablet (40 mg total) by mouth daily. 30 tablet 1  . zolpidem (AMBIEN) 5 MG tablet Take 2.5 mg by mouth at bedtime.      No current facility-administered medications for this visit.    PHYSICAL EXAMINATION: ECOG PERFORMANCE STATUS: 1 - Symptomatic but completely ambulatory  Vitals:   10/23/20 1149  BP: 115/65  Pulse: 67  Resp: 17  Temp: 97.9 F (36.6 C)  SpO2: 97%   Filed Weights   10/23/20 1149  Weight: 153 lb 11.2 oz (69.7 kg)    LABORATORY DATA:  I have reviewed the data as listed CMP Latest Ref Rng & Units 10/01/2018 08/11/2018 06/30/2017  Glucose 70 - 99 mg/dL 90 - 92  BUN 8 - 23 mg/dL 17 - 23  Creatinine 0.44 - 1.00 mg/dL 0.94 1.10(H) 0.87  Sodium 135 - 145 mmol/L 140 - 140  Potassium 3.5 - 5.1 mmol/L 4.1 - 4.7  Chloride 98 - 111 mmol/L 110 - 103  CO2 22 - 32 mmol/L 25 - 24  Calcium 8.9 - 10.3 mg/dL 8.8(L) - 9.6  Total Protein 6.5 - 8.1 g/dL 6.1(L) - -  Total Bilirubin 0.3 - 1.2 mg/dL 0.6 - -  Alkaline Phos 38 - 126 U/L 31(L) - -  AST 15 - 41 U/L 23 - -  ALT 0 - 44 U/L 18 - -    Lab Results  Component Value Date   WBC 5.1 09/26/2020   HGB 13.7 09/26/2020   HCT 42.4 09/26/2020   MCV 103.4 (H) 09/26/2020   PLT 231 09/26/2020   NEUTROABS 4.1 06/08/2014    ASSESSMENT & PLAN:  Malignant neoplasm of lower-inner quadrant of right female breast Right breast DCIS ER/PR positive diagnosed March 2015 7 mm focus on MRI and  alert lumpectomy 06/13/2014 2 mm focus of DCIS negative margins, ER 70%, PR 80% positive status post radiation therapy;   05/07/2017:Left lumpectomy: Complex sclerosing lesion with usual ductal hyperplasia, focal atypical lobular hyperplasia, no invasive cancer identified Adjuvant radiation therapy Prior treatment: Tamoxifen 20 mg [id number]- 2020  Osteopenia:Was previously on Boniva. Not taking it any further.Calcium and vitamin D Bone density 10 2015: T score -1.8. Bone Density July 2020: T score -1.9  Biopsy 09/04/2020: Right breast medial: IDC with DCIS grade 2, ER greater than 95%, PR 30%, Ki-67 20% HER-2 not done because of insufficient tissue; right breast LOQ: DCIS  10/12/2020:Right lumpectomy Donne Hazel): intermediate grade DCIS, clear margins, 1 right axillary lymph  node negative for carcinoma treatment plan: Anastrozole therapy.  Anastrozole counseling:We discussed the risks and benefits of anti-estrogen therapy with aromatase inhibitors. These include but not limited to insomnia, hot flashes, mood changes, vaginal dryness, bone density loss, and weight gain. We strongly believe that the benefits far outweigh the risks. Patient understands these risks and consented to starting treatment. Planned treatment duration is 5-7 years.  Return to clinic in 3 months for follow-up   No orders of the defined types were placed in this encounter.  The patient has a good understanding of the overall plan. she agrees with it. she will call with any problems that may develop before the next visit here.  Total time spent: 30 mins including face to face time and time spent for planning, charting and coordination of care  Nicholas Lose, MD 10/23/2020  I, Cloyde Reams Dorshimer, am acting as scribe for Dr. Nicholas Lose.  I have reviewed the above documentation for accuracy and completeness, and I agree with the above.

## 2020-10-23 ENCOUNTER — Inpatient Hospital Stay: Payer: PPO | Admitting: Hematology and Oncology

## 2020-10-23 ENCOUNTER — Other Ambulatory Visit: Payer: Self-pay

## 2020-10-23 DIAGNOSIS — Z170 Estrogen receptor positive status [ER+]: Secondary | ICD-10-CM | POA: Diagnosis not present

## 2020-10-23 DIAGNOSIS — C50311 Malignant neoplasm of lower-inner quadrant of right female breast: Secondary | ICD-10-CM

## 2020-10-23 MED ORDER — ANASTROZOLE 1 MG PO TABS: 1.0000 mg | ORAL_TABLET | Freq: Every day | ORAL | 3 refills | Status: DC

## 2020-10-24 DIAGNOSIS — F411 Generalized anxiety disorder: Secondary | ICD-10-CM | POA: Diagnosis not present

## 2020-10-25 ENCOUNTER — Encounter: Payer: Self-pay | Admitting: *Deleted

## 2020-10-28 ENCOUNTER — Other Ambulatory Visit: Payer: Self-pay | Admitting: Psychiatry

## 2020-10-28 DIAGNOSIS — F411 Generalized anxiety disorder: Secondary | ICD-10-CM

## 2020-11-05 ENCOUNTER — Encounter: Payer: Self-pay | Admitting: Psychiatry

## 2020-11-14 DIAGNOSIS — H5211 Myopia, right eye: Secondary | ICD-10-CM | POA: Diagnosis not present

## 2020-11-14 DIAGNOSIS — H16223 Keratoconjunctivitis sicca, not specified as Sjogren's, bilateral: Secondary | ICD-10-CM | POA: Diagnosis not present

## 2020-11-14 DIAGNOSIS — H43812 Vitreous degeneration, left eye: Secondary | ICD-10-CM | POA: Diagnosis not present

## 2020-11-14 DIAGNOSIS — Z961 Presence of intraocular lens: Secondary | ICD-10-CM | POA: Diagnosis not present

## 2020-11-27 ENCOUNTER — Ambulatory Visit: Payer: PPO | Attending: General Surgery

## 2020-11-27 ENCOUNTER — Other Ambulatory Visit: Payer: Self-pay

## 2020-11-27 DIAGNOSIS — Z170 Estrogen receptor positive status [ER+]: Secondary | ICD-10-CM | POA: Diagnosis not present

## 2020-11-27 DIAGNOSIS — R293 Abnormal posture: Secondary | ICD-10-CM | POA: Diagnosis not present

## 2020-11-27 DIAGNOSIS — C50311 Malignant neoplasm of lower-inner quadrant of right female breast: Secondary | ICD-10-CM

## 2020-11-27 NOTE — Therapy (Signed)
[place], [place], [address] Phone: [phone]   Fax:  [phone]  Physical Therapy Evaluation  Patient Details  Name: Linda [name] MRN: [id number] Date of Birth: 10-03-1946 Referring Provider (PT): Dr. Donne Hazel   Encounter Date: 11/27/2020   PT End of Session - 11/27/20 1707    Visit Number 1    Number of Visits 1    Date for PT Re-Evaluation 11/27/20    PT Start Time 1400    PT Stop Time 1453    PT Time Calculation (min) 53 min    Activity Tolerance Patient tolerated treatment well    Behavior During Therapy [facility] for tasks assessed/performed           Past Medical History:  Diagnosis Date  . Anxiety   . Arthritis   . Atrophic vaginitis   . Breast cancer ([place])    right (DCIS) s/p lumpectomy 7/15  . Cataract    BILATERAL  . Cervical dysplasia 1975  . DCIS (ductal carcinoma in situ)    RIGHT  . Depression   . Dysrhythmia    PVCs  . Family history of adverse reaction to anesthesia    Pt stated that her mother had difficulty waking up after anesthesia  . Family history of breast cancer   . Family history of colon cancer   . Family history of prostate cancer   . Hx of radiation therapy 08/02/14- 08/24/14   right reast 4256 cGy i 16 sessions, no boost  . Hypercholesteremia   . Insomnia   . Osteopenia 11/2011   T score -1.7 FRAX not calculated due to history of bisphosphonates and current Evista  . Personal history of radiation therapy 2015   right side  . PVC's (premature ventricular contractions)    PMH  . Seasonal allergies   . Skin cancer 2006   basal cell of face    Past Surgical History:  Procedure Laterality Date  . APPENDECTOMY  1955  . BREAST BIOPSY  2010   rt bx  . BREAST BIOPSY  2009   rt  . BREAST EXCISIONAL BIOPSY Left 2018   ALH & COMPLEX SCLEROSING LESION  . BREAST LUMPECTOMY  1983   BENIGN-rt  . BREAST LUMPECTOMY Right 2015   dcis  . BREAST LUMPECTOMY  WITH RADIOACTIVE SEED AND SENTINEL LYMPH NODE BIOPSY Right 10/12/2020   Procedure: RIGHT BREAST LUMPECTOMY X 2 WITH RADIOACTIVE SEED AND SENTINEL LYMPH NODE BIOPSY;  Surgeon: Rolm Bookbinder, MD;  Location: [place];  Service: General;  Laterality: Right;  . BREAST LUMPECTOMY WITH RADIOACTIVE SEED LOCALIZATION Right 06/13/2014   Procedure: RIGHT BREAST  RADIOACTIVE SEED GUIDED LUMPECTOMY;  Surgeon: Rolm Bookbinder, MD;  Location: [place];  Service: General;  Laterality: Right;for DCIS  . CATARACT EXTRACTION     Both eyes  . cataract surgery  2005  . COLONOSCOPY    . COLPOSCOPY    . EXCISION OF BASAL CELL CA FROM [facility]  2006  . FACELIFT  2014  . FACIAL COSMETIC SURGERY    . GYNECOLOGIC CRYOSURGERY    . KNEE SURGERY  2013   Arthroscopic-rt  . RADIOACTIVE SEED GUIDED EXCISIONAL BREAST BIOPSY Left 05/07/2017   Procedure: LEFT RADIOACTIVE SEED GUIDED EXCISIONAL BREAST BIOPSY;  Surgeon: Rolm Bookbinder, MD;  Location: [place];  Service: General;  Laterality: Left;  . REPOSITION OF LENS Right 10/01/2018   Procedure: REPOSITION OF LENS RIGHT EYE;  Surgeon: Jalene Mullet,  MD;  Location: [place];  Service: Ophthalmology;  Laterality: Right;    There were no vitals filed for this visit.    Subjective Assessment - 11/27/20 1455    Subjective Cancer found with screening MRI. Had surgery October 12, 2020 for right breast  lumpectomy with SLNB.  She oes not have to have chemo or radiation.  Not really having any pain.  No limitationss that she is aware of at presently.  She did have a prior tight breast lumpectomy in 2015 with radiation but no LN's removed    Pertinent History Had a right lumpectomy in 2015 and did have radiation afterwards.  No LN's removed. Right lumpectomy with SLNB on 10/12/20 and did not have to have chemo or radiation.              [facility] PT Assessment - 11/27/20 0001      Assessment   Referring Provider (PT) Dr.  Donne Hazel    Onset Date/Surgical Date 10/12/20    Hand Dominance Right      Restrictions   Weight Bearing Restrictions No    Other Position/Activity Restrictions no      Balance Screen   Has the patient fallen in the past 6 months No    Has the patient had a decrease in activity level because of a fear of falling?  No    Is the patient reluctant to leave their home because of a fear of falling?  No      Home Environment   Living Environment Private residence    Living Arrangements Spouse/significant other    Available Help at Discharge Family      Prior Function   Level of [place] Retired      Associate Professor   Overall Cognitive Status Within Functional Limits for tasks assessed      AROM   Right Shoulder Extension 70 Degrees    Right Shoulder Flexion 145 Degrees    Right Shoulder ABduction 172 Degrees    Right Shoulder Internal Rotation 80 Degrees    Right Shoulder External Rotation 100 Degrees    Left Shoulder Extension 60 Degrees    Left Shoulder Flexion 150 Degrees    Left Shoulder ABduction 170 Degrees    Left Shoulder Internal Rotation 75 Degrees    Left Shoulder External Rotation 85 Degrees             LYMPHEDEMA/ONCOLOGY QUESTIONNAIRE - 11/27/20 0001      Surgeries   Lumpectomy Date 10/12/20    Sentinel Lymph Node Biopsy Date 10/12/20    Number Lymph Nodes Removed 1      Treatment   Active Chemotherapy Treatment No    Past Chemotherapy Treatment No    Active Radiation Treatment No    Past Radiation Treatment Yes    Date --   2015   Body Site right breast    Current Hormone Treatment Yes    Drug Name arimidex    Past Hormone Therapy --   tamoxifen x 5 years     What other symptoms do you have   Are you Having Heaviness or Tightness No    Are you having Pain No    Are you having pitting edema No    Is it Hard or Difficult finding clothes that fit No    Do you have infections No    Is there Decreased scar mobility Yes       Right Upper Extremity Lymphedema  10 cm Proximal to Olecranon Process 25.4 cm    Olecranon Process 22.8 cm    10 cm Proximal to Ulnar Styloid Process 15.3 cm    Just Proximal to Ulnar Styloid Process 13.8 cm    Across Hand at [employer] 18.8 cm    At [place] of 2nd Digit 6.2 cm      Left Upper Extremity Lymphedema   10 cm Proximal to Olecranon Process 25.3 cm    Olecranon Process 23 cm    10 cm Proximal to Ulnar Styloid Process 16.1 cm    Just Proximal to Ulnar Styloid Process 13.5 cm    Across Hand at [employer] 18 cm    At [place] of 2nd Digit 5.7 cm                   Objective measurements completed on examination: See above findings.               PT Education - 11/27/20 1501    Education Details Pt was educated in post op shoulder exs.  She has minimal limitations but will do WAll walks for abd, Scapular retraction, and stargazer with hands behind head for another week or 2 to eliminate mild tightness present.  She was educated about precautions for lymphedema and the ABC class. She does not require further therapy at this time    Person(s) Educated Patient    Comprehension Verbalized understanding               PT Long Term Goals - 11/27/20 1716      PT LONG TERM GOAL #1   Title Pt will have AROM Right shoulder equal to left shoulder post surgery    Time 1    Period Days    Status Achieved    Target Date 11/27/20      PT LONG TERM GOAL #2   Title Pt will be educated in warning signs ofinfection and ways to prevent lymphedema    Time 1    Period Days    Status Achieved                  Plan - 11/27/20 1709    Clinical Impression Statement Pt was here for post op follow up.  She has restored her normal shoulder ROM and has no pain.  Circumferential measurements are WNL.  She does have mild thickening of scar from SLNB and has been instructed in scar massage.  She was given several exercises that she will do for another week or  2.  She has no other deficits that require further intervention at this time. We also discussed precautions for lymphedema and skin care . She was given information on ABC class    Stability/Clinical Decision Making Stable/Uncomplicated    Clinical Decision Making Low    Rehab Potential Excellent    PT Frequency One time visit    PT Treatment/Interventions Patient/family education;Scar mobilization;Therapeutic exercise    PT Next Visit Plan Pt discharged today secondary to not requiring further interventions    PT Home Exercise Plan post op exs prn, ABC class    Consulted and Agree with Plan of Care Patient           Patient will benefit from skilled therapeutic intervention in order to improve the following deficits and impairments:  Decreased scar mobility,Postural dysfunction,Decreased knowledge of precautions  Visit Diagnosis: Malignant neoplasm of lower-inner quadrant of right breast of female, estrogen receptor positive (  [place])  Estrogen receptor positive  Abnormal posture     Problem List Patient Active Problem List   Diagnosis Date Noted  . Frontal fibrosing alopecia 12/27/2015  . Lichen plano-pilaris 12/27/2015  . Genetic testing 12/20/2014  . Family history of breast cancer   . Family history of prostate cancer   . Family history of colon cancer   . Palpitations 10/30/2014  . Premature ventricular contraction 10/30/2014  . Hyperlipidemia 10/30/2014  . Osteopenia 07/10/2014  . Malignant neoplasm of lower-inner quadrant of right female breast ([place]) 05/19/2014  . Ductal carcinoma in situ (DCIS) of right breast 05/03/2014  . Abnormal mammography 03/07/2014  . ASCUS (atypical squamous cells of undetermined significance) on Pap smear 02/16/2014  . Unexplained endometrial cells on cervical cytology 02/16/2014  . Atrophic vaginitis 01/03/2014  . Constipation 01/03/2014  . Insomnia 05/19/2013  . Cervical dysplasia   . Hypercholesteremia   . ANXIETY 09/11/2009  . ANAL  FISSURE 09/11/2009  . ARTHRITIS 09/11/2009  . DIVERTICULOSIS, COLON 09/06/2009  . RECTAL BLEEDING 09/06/2009  . RECTAL PAIN 09/06/2009  . COLONIC POLYPS, HYPERPLASTIC, HX OF 09/06/2009  PHYSICAL THERAPY DISCHARGE SUMMARY  Visits from Start of Care:1  Current functional level related to goals / functional outcomes:see above   Remaining deficits: Mild thickening scar tissue at [facility] site   Education / Equipment: NA Plan: Patient agrees to discharge.  Patient goals were met. Patient is being discharged due to meeting the stated rehab goals.  ?????       Claris Pong 11/27/2020, 5:21 PM  [place], [place], [address] Phone: [phone]   Fax:  [phone]  Name: Linda [name] MRN: [id number] Date of Birth: 08/27/1946 Cheral Almas, PT 11/27/20 5:23 PM

## 2020-11-27 NOTE — Patient Instructions (Signed)
Patient was instructed today in a home exercise program today for post op shoulder range of motion. These included active assist shoulder flexion in sitting/supine, scapular retraction, wall walking with shoulder abduction, and hands behind head external rotation in sitting or supine.  She was encouraged to do these twice a day, holding 3 seconds and repeating 5 times for a week or 2. She has no real limitations except for very mild end range tightness. It is recommended that this patient attend the After Breast Cancer Class at [facility].  This is a free one time class held the 1st and 3rd Monday of each month from 11-12.  The patient will learn further lymphedema risk reduction techniques and additional exercises for regaining shoulder mobility, posture, and strength.  She is encouraged to contact the Out[facility] at [phone] when she is ready to attend the class. The pt doesn't feel strongly that she needs to attend this, however, she was given the information should she decide she would like to attend.

## 2020-11-28 DIAGNOSIS — F411 Generalized anxiety disorder: Secondary | ICD-10-CM | POA: Diagnosis not present

## 2020-11-29 DIAGNOSIS — N952 Postmenopausal atrophic vaginitis: Secondary | ICD-10-CM | POA: Diagnosis not present

## 2020-11-29 DIAGNOSIS — Z853 Personal history of malignant neoplasm of breast: Secondary | ICD-10-CM | POA: Diagnosis not present

## 2020-11-29 DIAGNOSIS — Z1151 Encounter for screening for human papillomavirus (HPV): Secondary | ICD-10-CM | POA: Diagnosis not present

## 2020-11-29 DIAGNOSIS — Z01419 Encounter for gynecological examination (general) (routine) without abnormal findings: Secondary | ICD-10-CM | POA: Diagnosis not present

## 2020-12-11 ENCOUNTER — Telehealth (INDEPENDENT_AMBULATORY_CARE_PROVIDER_SITE_OTHER): Payer: PPO | Admitting: Psychiatry

## 2020-12-11 ENCOUNTER — Encounter: Payer: Self-pay | Admitting: Psychiatry

## 2020-12-11 DIAGNOSIS — F411 Generalized anxiety disorder: Secondary | ICD-10-CM

## 2020-12-11 MED ORDER — BUSPIRONE HCL 10 MG PO TABS: ORAL_TABLET | ORAL | 0 refills | Status: DC

## 2020-12-11 MED ORDER — GABAPENTIN 400 MG PO CAPS: 400.0000 mg | ORAL_CAPSULE | Freq: Three times a day (TID) | ORAL | 1 refills | Status: DC

## 2020-12-11 MED ORDER — VIIBRYD 40 MG PO TABS: 40.0000 mg | ORAL_TABLET | Freq: Every day | ORAL | 1 refills | Status: DC

## 2020-12-11 NOTE — Progress Notes (Signed)
Linda [name] [id number] 30-May-1946 75 y.o.  Video Visit via My Chart  I connected with pt by My Chart and verified that I am speaking with the correct person using two identifiers.   I discussed the limitations, risks, security and privacy concerns of performing an evaluation and management service by My Chart  and the availability of in person appointments. I also discussed with the patient that there may be a patient responsible charge related to this service. The patient expressed understanding and agreed to proceed.  I discussed the assessment and treatment plan with the patient. The patient was provided an opportunity to ask questions and all were answered. The patient agreed with the plan and demonstrated an understanding of the instructions.   The patient was advised to call back or seek an in-person evaluation if the symptoms worsen or if the condition fails to improve as anticipated.  I provided 30 minutes of video time during this encounter.  The patient was located at home and the provider was located office. Session started at 930 and ended at 1000  Subjective:   Patient ID:  Linda [name] is a 75 y.o. (DOB May 29, 1946) female.  Chief Complaint:  Chief Complaint  Patient presents with  . Follow-up  . Anxiety    HPI Kymora Sciara presents to the office today for follow-up of generalized anxiety disorder. First seen February 21, 2020 Lexapro 5 mg was added to buspirone.  She continued Ambien 5 mg prn.  05/02/2020 appointment the following is noted:  Still anxious without change with Lexapro.  No SE.  Asks about genetic testing.   Takes Xanax only about weekly but wants to continue it.  Ambien 2.5 mg regularluy. Plan: If increase Lexapro does not help then do Genesight testing. Continue Lexapro 10 mg daily Continue buspirone 10 mg 3 times daily Gabapentin 300 TID Xanax 0.5 mg twice daily as needed anxiety.  06/28/2120 appt with the following noted: Doesn't  think much difference with Lexapro 10.  Initially sleepy but it resolved.  Chronic worry and can't stop my head.  No SE. Wonders about [employer] Needs Ambien 2.5 mg HS to sleep. Still on others noted and wants better benefit.  Not depressed. Patient reports stable mood and denies depressed or irritable moods.With meds Patient denies difficulty with sleep initiation or maintenance. Denies appetite disturbance.  Patient reports that energy and motivation have been good.  Patient denies any difficulty with concentration.  Patient denies any suicidal ideation. Plan: Rec increase  Lexapro 15 mg daily Continue buspirone 10 mg 3 times daily Xanax 0.5 mg twice daily as needed anxiety. Gensight testing  08/29/20 appt noted: No particular improvements with incrase in Lexapro and is a little more sleepy. Anxiety is about 7/10.  Not depressed. Switch Lexapro to Viibryd 20. Continue buspirone 10 mg 3 times daily Xanax 0.5 mg twice daily as needed anxiety.  10/16/20 appt with following noted: Sleepiness better off Lexapro 15.  No SE or benefit with Viibryd. No depressed.  Anxiety 5/10.   Sleep is good.   Plan: Increase Viibryd to 30 mg for 2 week then increase to 40 mg daily. Continue buspirone 10 mg 3 times daily Xanax 0.5 mg twice daily as needed anxiety.  12/11/2020 appt with following noted: No sig change anxiety so far.  Rare lightheaded.  2 sons responded to Wellbutrin and other on Zoloft with Wellbutrin and asks about it.  Past Psychiatric History:  Therapy [facility] Chou. prescriber Pauline Good & PCP Past  Psychiatric Medication Trials:  Buspirone 10 TID NR but a little lightheaded Lexapro 15 sleepy Gabapentin 300 TID for neuropathy About 5 years ago tried citalopram briefly No Zoloft, paxil, duloxetine, fluoxetine.  Xanax.  Review of Systems:  Review of Systems  Constitutional: Positive for unexpected weight change.  Gastrointestinal: Negative for abdominal pain, constipation,  diarrhea and nausea.  Neurological: Negative for dizziness, tremors and weakness.       Neuropathy    Medications: I have reviewed the patient's current medications.  Current Outpatient Medications  Medication Sig Dispense Refill  . acetaminophen (TYLENOL) 500 MG tablet Take 1,000 mg by mouth every 6 (six) hours as needed.    . ALPRAZolam (XANAX) 0.5 MG tablet Take 0.5 tablets (0.25 mg total) by mouth 2 (two) times daily as needed for anxiety or sleep. 60 tablet 1  . anastrozole (ARIMIDEX) 1 MG tablet Take 1 tablet (1 mg total) by mouth daily. 90 tablet 3  . Ascorbic Acid (VITAMIN C) 1000 MG tablet Take 1,000 mg by mouth 2 (two) times daily.    Marland Kitchen b complex vitamins tablet Take 1 tablet by mouth at bedtime.    . calcium-vitamin D 250-100 MG-UNIT tablet Take 1 tablet by mouth 2 (two) times daily.    Marland Kitchen conjugated estrogens (PREMARIN) vaginal cream Place 1 Applicatorful vaginally 3 (three) times a week. (Patient taking differently: Place 1 Applicatorful vaginally 2 (two) times a week.)    . cycloSPORINE (RESTASIS) 0.05 % ophthalmic emulsion 1 drop 2 (two) times daily.    . hydrocortisone (ANUSOL-HC) 2.5 % rectal cream Place 1 application rectally daily as needed for hemorrhoids or anal itching.    . Multiple Vitamin (MULTIVITAMIN WITH MINERALS) TABS tablet Take 1 tablet by mouth at bedtime.    . naproxen sodium (ALEVE) 220 MG tablet Take 440 mg by mouth 2 (two) times daily as needed (for pain.).     Marland Kitchen rosuvastatin (CRESTOR) 10 MG tablet Take 1 tablet (10 mg total) by mouth daily. (Patient taking differently: Take 10 mg by mouth at bedtime.)    . zolpidem (AMBIEN) 5 MG tablet Take 2.5 mg by mouth at bedtime.    . busPIRone (BUSPAR) 10 MG tablet TAKE 1 TABLET(10 MG) BY MOUTH THREE TIMES DAILY 270 tablet 0  . gabapentin (NEURONTIN) 400 MG capsule Take 1 capsule (400 mg total) by mouth 3 (three) times daily. 90 capsule 1  . oxyCODONE (OXY IR/ROXICODONE) 5 MG immediate release tablet Take 1 tablet (5  mg total) by mouth every 6 (six) hours as needed. (Patient not taking: Reported on 11/27/2020) 10 tablet 0  . Vilazodone HCl (VIIBRYD) 40 MG TABS Take 1 tablet (40 mg total) by mouth daily. 30 tablet 1   No current facility-administered medications for this visit.    Medication Side Effects: None  Allergies:  Allergies  Allergen Reactions  . Other Other (See Comments)    ? Seasonal allergies - classic symptoms    Past Medical History:  Diagnosis Date  . Anxiety   . Arthritis   . Atrophic vaginitis   . Breast cancer ([place])    right (DCIS) s/p lumpectomy 7/15  . Cataract    BILATERAL  . Cervical dysplasia 1975  . DCIS (ductal carcinoma in situ)    RIGHT  . Depression   . Dysrhythmia    PVCs  . Family history of adverse reaction to anesthesia    Pt stated that her mother had difficulty waking up after anesthesia  . Family history of breast  cancer   . Family history of colon cancer   . Family history of prostate cancer   . Hx of radiation therapy 08/02/14- 08/24/14   right reast 4256 cGy i 16 sessions, no boost  . Hypercholesteremia   . Insomnia   . Osteopenia 11/2011   T score -1.7 FRAX not calculated due to history of bisphosphonates and current Evista  . Personal history of radiation therapy 2015   right side  . PVC's (premature ventricular contractions)    PMH  . Seasonal allergies   . Skin cancer 2006   basal cell of face    Family History  Problem Relation Age of Onset  . Breast cancer Mother        DIAGNOSED IN HER 80'S  . Hyperlipidemia Mother   . Osteoporosis Mother   . Breast cancer Sister        DIAGNOSED AT AGE 79  . Hypertension Maternal Grandmother   . Breast cancer Maternal Grandmother        possibly breast cancer; had mastectomy in her 30s-40s, but don't know if it was due to cancer  . Heart disease Maternal Grandfather   . Stroke Maternal Grandfather   . Diabetes Son        TYPE 1 DIABETES  . Colon cancer Paternal Grandfather   . Cancer  Paternal Grandfather        colon  . Heart failure Son   . Kidney cancer Paternal Grandmother        bladder/kidney cancer  . Prostate cancer Maternal Uncle     Social History   Socioeconomic History  . Marital status: Married    Spouse name: Not on file  . Number of children: Not on file  . Years of education: Not on file  . Highest education level: Not on file  Occupational History  . Not on file  Tobacco Use  . Smoking status: Former Smoker    Quit date: 05/19/1974    Years since quitting: 46.5  . Smokeless tobacco: Never Used  . Tobacco comment: smoked in college  Vaping Use  . Vaping Use: Never used  Substance and Sexual Activity  . Alcohol use: Yes    Alcohol/week: 5.0 standard drinks    Types: 5 Standard drinks or equivalent per week    Comment: daily ETOH cocktail nightly  . Drug use: No  . Sexual activity: Yes    Birth control/protection: Post-menopausal    Comment: G4 P4   Other Topics Concern  . Not on file  Social History Narrative   Pt lives in [place] with spouse.  4 children and 8 grandchildren.   House wife   Social Determinants of Radio broadcast assistant Strain: Not on file  Food Insecurity: Not on file  Transportation Needs: Not on file  Physical Activity: Not on file  Stress: Not on file  Social Connections: Not on file  Intimate Partner Violence: Not on file    Past Medical History, Surgical history, Social history, and Family history were reviewed and updated as appropriate.   Please see review of systems for further details on the patient's review from today.   Objective:   Physical Exam:  There were no vitals taken for this visit.  Physical Exam Neurological:     Mental Status: She is alert and oriented to person, place, and time.     Cranial Nerves: No dysarthria.  Psychiatric:        Attention and Perception: Attention and perception  normal.        Mood and Affect: Mood is anxious. Mood is not depressed. Affect is not  tearful.        Speech: Speech normal.        Behavior: Behavior is cooperative.        Thought Content: Thought content normal. Thought content is not paranoid or delusional. Thought content does not include homicidal or suicidal ideation. Thought content does not include homicidal or suicidal plan.        Cognition and Memory: Cognition and memory normal.        Judgment: Judgment normal.     Comments: Insight intact     Lab Review:     Component Value Date/Time   NA 140 10/01/2018 0628   NA 140 06/30/2017 0904   K 4.1 10/01/2018 0628   CL 110 10/01/2018 0628   CO2 25 10/01/2018 0628   GLUCOSE 90 10/01/2018 0628   BUN 17 10/01/2018 0628   BUN 23 06/30/2017 0904   CREATININE 0.94 10/01/2018 0628   CALCIUM 8.8 (L) 10/01/2018 0628   PROT 6.1 (L) 10/01/2018 0628   ALBUMIN 3.6 10/01/2018 0628   AST 23 10/01/2018 0628   ALT 18 10/01/2018 0628   ALKPHOS 31 (L) 10/01/2018 0628   BILITOT 0.6 10/01/2018 0628   GFRNONAA 59 (L) 10/01/2018 0628   GFRAA >60 10/01/2018 0628       Component Value Date/Time   WBC 5.1 09/26/2020 0942   RBC 4.10 09/26/2020 0942   HGB 13.7 09/26/2020 0942   HCT 42.4 09/26/2020 0942   PLT 231 09/26/2020 0942   MCV 103.4 (H) 09/26/2020 0942   MCH 33.4 09/26/2020 0942   MCHC 32.3 09/26/2020 0942   RDW 12.9 09/26/2020 0942   LYMPHSABS 1.6 06/08/2014 1330   MONOABS 0.6 06/08/2014 1330   EOSABS 0.2 06/08/2014 1330   BASOSABS 0.0 06/08/2014 1330    No results found for: POCLITH, LITHIUM   No results found for: PHENYTOIN, PHENOBARB, VALPROATE, CBMZ   .res Assessment: Plan:    Maysam was seen today for follow-up and anxiety.  Diagnoses and all orders for this visit:  Generalized anxiety disorder -     gabapentin (NEURONTIN) 400 MG capsule; Take 1 capsule (400 mg total) by mouth 3 (three) times daily. -     Vilazodone HCl (VIIBRYD) 40 MG TABS; Take 1 tablet (40 mg total) by mouth daily. -     busPIRone (BUSPAR) 10 MG tablet; TAKE 1 TABLET(10 MG)  BY MOUTH THREE TIMES DAILY  Discussed diagnosis and treatment plan.  Again.  SSRIs are generally preferred but after Romie Minus site testing we have gone with Viibryd.  So far at 5 weeks on 40 mg daily she has not noted a significant change but she is tolerating it well.  She had some weight gain of about 7 pounds on Lexapro and has not lost it yet.  Genesight testing results disc at length again.  Suggest Viibryd.  Did genetic testing 08/29/20 per her request..  Again reviewed the results with her in some detail.  This was explored in greater detail today per her request after her review of it independently.  We covered each of the normal and abnormal for [facility] dynamic genes and pharmacokinetic genes as well as which meds are metabolized by which genes and how that is applied.  It was discussed that this is not the complete in total genetic information that is relevant to the use of psychiatric meds but it  contains the currently available genetic information.  continue Viibryd 40 mg daily, been on this dose for 5 weeks.  Give more time Continue buspirone 10 mg 3 times daily Xanax 0.5 mg twice daily as needed anxiety.  Answered questions about Xanax use. We discussed the short-term risks associated with benzodiazepines including sedation and increased fall risk among others.  Discussed long-term side effect risk including dependence, potential withdrawal symptoms, and the potential eventual dose-related risk of dementia.  But recent studies from 2020 dispute this association between benzodiazepines and dementia risk. Newer studies in 2020 do not support an association with dementia.  If Viibryd works wean buspirone.  She does not believe the buspirone is helping very much.  But as discussed we would prefer to change 1 med at a time in order to better interpret the results. Alternative Zoloft DT son benefits  Increase gabapentin 400 TID off label for anxiety Discussed side effects including dizziness in  particular and balance trouble as well as fatigue and potential for weight gain.  FU 6 weeks.  Lynder Parents, MD, DFAPA   Please see After Visit Summary for patient specific instructions.  Future Appointments  Date Time Provider [place]  01/21/2021 10:30 AM Nicholas Lose, MD [facility] None  05/31/2021 11:30 AM Nicholas Lose, MD [facility] None    No orders of the defined types were placed in this encounter.   -------------------------------

## 2020-12-25 ENCOUNTER — Other Ambulatory Visit: Payer: Self-pay | Admitting: Internal Medicine

## 2020-12-25 DIAGNOSIS — Z1231 Encounter for screening mammogram for malignant neoplasm of breast: Secondary | ICD-10-CM

## 2020-12-26 DIAGNOSIS — F411 Generalized anxiety disorder: Secondary | ICD-10-CM | POA: Diagnosis not present

## 2021-01-02 DIAGNOSIS — D225 Melanocytic nevi of trunk: Secondary | ICD-10-CM | POA: Diagnosis not present

## 2021-01-02 DIAGNOSIS — L433 Subacute (active) lichen planus: Secondary | ICD-10-CM | POA: Diagnosis not present

## 2021-01-02 DIAGNOSIS — L570 Actinic keratosis: Secondary | ICD-10-CM | POA: Diagnosis not present

## 2021-01-02 DIAGNOSIS — L218 Other seborrheic dermatitis: Secondary | ICD-10-CM | POA: Diagnosis not present

## 2021-01-02 DIAGNOSIS — L821 Other seborrheic keratosis: Secondary | ICD-10-CM | POA: Diagnosis not present

## 2021-01-02 DIAGNOSIS — Z85828 Personal history of other malignant neoplasm of skin: Secondary | ICD-10-CM | POA: Diagnosis not present

## 2021-01-02 DIAGNOSIS — L814 Other melanin hyperpigmentation: Secondary | ICD-10-CM | POA: Diagnosis not present

## 2021-01-02 DIAGNOSIS — D2262 Melanocytic nevi of left upper limb, including shoulder: Secondary | ICD-10-CM | POA: Diagnosis not present

## 2021-01-20 NOTE — Assessment & Plan Note (Signed)
Right breast DCIS ER/PR positive diagnosed March 2015 7 mm focus on MRI and alert lumpectomy 06/13/2014 2 mm focus of DCIS negative margins, ER 70%, PR 80% positive status post radiation therapy;   05/07/2017:Left lumpectomy: Complex sclerosing lesion with usual ductal hyperplasia, focal atypical lobular hyperplasia, no invasive cancer identified Adjuvant radiation therapy Priortreatment: Tamoxifen 20 mg daily2015-2020  Osteopenia:Was previously on Boniva. Not taking it any further.Calcium and vitamin D Bone density 10 2015: T score -1.8. Bone Density July 2020: T score -1.9  Biopsy 09/04/2020: Right breast medial: IDC with DCIS grade 2, ER greater than 95%, PR 30%, Ki-67 20%HER-2 not done because of insufficient tissue;right breast LOQ: DCIS  10/12/2020:Right lumpectomy Donne Hazel): intermediate grade DCIS, clear margins, 1 right axillary lymph node negative for carcinoma treatment plan: Anastrozole therapy.  Anastrozole Toxicities:  Breast Cancer Surveillance: Mammograms

## 2021-01-20 NOTE — Progress Notes (Signed)
Patient Care Team: Marton Redwood, MD as PCP - General (Internal Medicine) Thompson Grayer, MD as PCP - Cardiology (Cardiology) Mauro Kaufmann, RN as Oncology Nurse Navigator Rockwell Germany, RN as Oncology Nurse Navigator  DIAGNOSIS:    ICD-10-CM   1. Osteopenia, unspecified location  M85.80 DG Bone Density  2. Malignant neoplasm of lower-inner quadrant of right breast of female, estrogen receptor positive ([place])  C50.311 MR BREAST BILATERAL W WO CONTRAST INC CAD   Z17.0     SUMMARY OF ONCOLOGIC HISTORY: Oncology History  Malignant neoplasm of lower-inner quadrant of right female breast ([place])  02/03/2014 Breast MRI   7 X 6X 5 mm right breast abnormality as 2 immediately adjacent 3 mm foci of enhancement   05/19/2014 Initial Diagnosis   Malignant neoplasm of lower-inner quadrant of female breast DCIS   06/13/2014 Surgery   R Lumpectomy DCIS 2 mm size Grade 1 Er 70%, PR 80%, Margins Neg   07/25/2014 - 08/25/2014 Radiation Therapy   Adjuvant radiation therapy   10/18/2014 Procedure   genetic testing was normal no mutations identified   05/07/2017 Surgery   Left lumpectomy: Complex sclerosing lesion with usual ductal hyperplasia, focal atypical lobular hyperplasia, no invasive cancer identified   08/10/2017 - 06/02/2019 Anti-estrogen oral therapy   Tamoxifen 20 mg daily x5 years   08/11/2018 Breast MRI   Persistent non-mass enhancement within the left LOQ at anterior depth, at the site of patient's earlier MRI-guided biopsy revealing complex sclerosing lesion, atypical lobular hyperplasia and pseudoangiomatous stromal hyperplasia. This non-mass enhancement is not significantly changed in extent compared to the previous MRI of 02/05/2017 (pre biopsy). No evidence of malignancy within right breast.      09/04/2020 Relapse/Recurrence   Right breast biopsy medial: IDC with DCIS, grade 2, ER greater than 95%, PR 30%, Ki-67 20%, insufficient tumor for HER-2 testing   10/12/2020 Surgery    Right lumpectomy Donne Hazel): intermediate grade DCIS, clear margins, 1 right axillary lymph node negative for carcinoma.   10/24/2020 -  Anti-estrogen oral therapy   Anastrozole     CHIEF COMPLIANT: Follow-up of right breast cancer on anastrozole  INTERVAL HISTORY: Linda [name] is a 75 y.o. with above-mentioned history of recurrent right breast cancer who underwent a lumpectomy and is currently on antiestrogen therapy with anastrozole. She presents to the clinic today for follow-up.  She is tolerating anastrozole extremely well without any problems or concerns.  Denies any new lumps or nodules in the breast.  ALLERGIES:  is allergic to other.  MEDICATIONS:  Current Outpatient Medications  Medication Sig Dispense Refill  . acetaminophen (TYLENOL) 500 MG tablet Take 1,000 mg by mouth every 6 (six) hours as needed.    . ALPRAZolam (XANAX) 0.5 MG tablet Take 0.5 tablets (0.25 mg total) by mouth 2 (two) times daily as needed for anxiety or sleep. 60 tablet 1  . anastrozole (ARIMIDEX) 1 MG tablet Take 1 tablet (1 mg total) by mouth daily. 90 tablet 3  . Ascorbic Acid (VITAMIN C) 1000 MG tablet Take 1,000 mg by mouth 2 (two) times daily.    Marland Kitchen b complex vitamins tablet Take 1 tablet by mouth at bedtime.    . busPIRone (BUSPAR) 10 MG tablet TAKE 1 TABLET(10 MG) BY MOUTH THREE TIMES DAILY 270 tablet 0  . calcium-vitamin D 250-100 MG-UNIT tablet Take 1 tablet by mouth 2 (two) times daily.    Marland Kitchen conjugated estrogens (PREMARIN) vaginal cream Place 1 Applicatorful vaginally 3 (three) times a week. (Patient  taking differently: Place 1 Applicatorful vaginally 2 (two) times a week.)    . cycloSPORINE (RESTASIS) 0.05 % ophthalmic emulsion 1 drop 2 (two) times daily.    Marland Kitchen gabapentin (NEURONTIN) 400 MG capsule Take 1 capsule (400 mg total) by mouth 3 (three) times daily. 90 capsule 1  . hydrocortisone (ANUSOL-HC) 2.5 % rectal cream Place 1 application rectally daily as needed for hemorrhoids or anal  itching.    . Multiple Vitamin (MULTIVITAMIN WITH MINERALS) TABS tablet Take 1 tablet by mouth at bedtime.    . naproxen sodium (ALEVE) 220 MG tablet Take 440 mg by mouth 2 (two) times daily as needed (for pain.).     Marland Kitchen rosuvastatin (CRESTOR) 10 MG tablet Take 1 tablet (10 mg total) by mouth daily. (Patient taking differently: Take 10 mg by mouth at bedtime.)    . Vilazodone HCl (VIIBRYD) 40 MG TABS Take 1 tablet (40 mg total) by mouth daily. 30 tablet 1  . zolpidem (AMBIEN) 5 MG tablet Take 2.5 mg by mouth at bedtime.     No current facility-administered medications for this visit.    PHYSICAL EXAMINATION: ECOG PERFORMANCE STATUS: 1 - Symptomatic but completely ambulatory  Vitals:   01/21/21 1041  BP: 123/66  Pulse: 82  Resp: 19  Temp: 98.1 F (36.7 C)  SpO2: 95%   Filed Weights   01/21/21 1041  Weight: 152 lb 4.8 oz (69.1 kg)    BREAST: No palpable masses or nodules in either right or left breasts. No palpable axillary supraclavicular or infraclavicular adenopathy no breast tenderness or nipple discharge. (exam performed in the presence of a chaperone)  LABORATORY DATA:  I have reviewed the data as listed CMP Latest Ref Rng & Units 10/01/2018 08/11/2018 06/30/2017  Glucose 70 - 99 mg/dL 90 - 92  BUN 8 - 23 mg/dL 17 - 23  Creatinine 0.44 - 1.00 mg/dL 0.94 1.10(H) 0.87  Sodium 135 - 145 mmol/L 140 - 140  Potassium 3.5 - 5.1 mmol/L 4.1 - 4.7  Chloride 98 - 111 mmol/L 110 - 103  CO2 22 - 32 mmol/L 25 - 24  Calcium 8.9 - 10.3 mg/dL 8.8(L) - 9.6  Total Protein 6.5 - 8.1 g/dL 6.1(L) - -  Total Bilirubin 0.3 - 1.2 mg/dL 0.6 - -  Alkaline Phos 38 - 126 U/L 31(L) - -  AST 15 - 41 U/L 23 - -  ALT 0 - 44 U/L 18 - -    Lab Results  Component Value Date   WBC 5.1 09/26/2020   HGB 13.7 09/26/2020   HCT 42.4 09/26/2020   MCV 103.4 (H) 09/26/2020   PLT 231 09/26/2020   NEUTROABS 4.1 06/08/2014    ASSESSMENT & PLAN:  Malignant neoplasm of lower-inner quadrant of right female  breast Right breast DCIS ER/PR positive diagnosed March 2015 7 mm focus on MRI and alert lumpectomy 06/13/2014 2 mm focus of DCIS negative margins, ER 70%, PR 80% positive status post radiation therapy;   05/07/2017:Left lumpectomy: Complex sclerosing lesion with usual ductal hyperplasia, focal atypical lobular hyperplasia, no invasive cancer identified Adjuvant radiation therapy Priortreatment: Tamoxifen 20 mg daily2015-2020  Osteopenia:Was previously on Boniva. Not taking it any further.Calcium and vitamin D Bone density 10 2015: T score -1.8. Bone Density July 2020: T score -2  Biopsy 09/04/2020: Right breast medial: IDC with DCIS grade 2, ER greater than 95%, PR 30%, Ki-67 20%HER-2 not done because of insufficient tissue;right breast LOQ: DCIS  10/12/2020:Right lumpectomy Donne Hazel): intermediate grade DCIS, clear margins,  1 right axillary lymph node negative for carcinoma treatment  .  Anastrozole Toxicities: Tolerating it well without any problems or concerns.  Denies any hot flashes or myalgias.   Breast Cancer Surveillance: Mammograms alternating with breast MRIs (to be done in September 2022). I ordered a bone density test to be done in July 2022.  Return to clinic in 1 year for follow-up.  Orders Placed This Encounter  Procedures  . MR BREAST BILATERAL W WO CONTRAST INC CAD    Standing Status:   Future    Standing Expiration Date:   01/21/2022    Order Specific Question:   If indicated for the ordered procedure, I authorize the administration of contrast media per Radiology protocol    Answer:   Yes    Order Specific Question:   What is the patient's sedation requirement?    Answer:   No Sedation    Order Specific Question:   Does the patient have a pacemaker or implanted devices?    Answer:   No    Order Specific Question:   Preferred imaging location?    Answer:   [facility] (table limit-550lbs)    Order Specific Question:   Release to patient     Answer:   Immediate  . DG Bone Density    Standing Status:   Future    Standing Expiration Date:   01/21/2022    Order Specific Question:   Reason for Exam (SYMPTOM  OR DIAGNOSIS REQUIRED)    Answer:   Osteopenia post menopausal    Order Specific Question:   Preferred imaging location?    Answer:   [facility]    Order Specific Question:   Release to patient    Answer:   Immediate   The patient has a good understanding of the overall plan. she agrees with it. she will call with any problems that may develop before the next visit here.  Total time spent: 30 mins including face to face time and time spent for planning, charting and coordination of care  Rulon Eisenmenger, MD, MPH 01/21/2021  I, Cloyde Reams Dorshimer, am acting as scribe for Dr. Nicholas Lose.  I have reviewed the above documentation for accuracy and completeness, and I agree with the above.

## 2021-01-21 ENCOUNTER — Other Ambulatory Visit: Payer: Self-pay

## 2021-01-21 ENCOUNTER — Inpatient Hospital Stay: Payer: PPO | Attending: Hematology and Oncology | Admitting: Hematology and Oncology

## 2021-01-21 VITALS — BP 123/66 | HR 82 | Temp 98.1°F | Resp 19 | Ht 67.0 in | Wt 152.3 lb

## 2021-01-21 DIAGNOSIS — C50311 Malignant neoplasm of lower-inner quadrant of right female breast: Secondary | ICD-10-CM | POA: Diagnosis not present

## 2021-01-21 DIAGNOSIS — Z79899 Other long term (current) drug therapy: Secondary | ICD-10-CM | POA: Diagnosis not present

## 2021-01-21 DIAGNOSIS — Z79811 Long term (current) use of aromatase inhibitors: Secondary | ICD-10-CM | POA: Insufficient documentation

## 2021-01-21 DIAGNOSIS — M8580 Other specified disorders of bone density and structure, unspecified site: Secondary | ICD-10-CM | POA: Diagnosis not present

## 2021-01-21 DIAGNOSIS — Z923 Personal history of irradiation: Secondary | ICD-10-CM | POA: Insufficient documentation

## 2021-01-21 DIAGNOSIS — Z170 Estrogen receptor positive status [ER+]: Secondary | ICD-10-CM | POA: Diagnosis not present

## 2021-01-30 ENCOUNTER — Ambulatory Visit: Payer: PPO | Admitting: Psychiatry

## 2021-01-31 DIAGNOSIS — F411 Generalized anxiety disorder: Secondary | ICD-10-CM | POA: Diagnosis not present

## 2021-02-07 ENCOUNTER — Ambulatory Visit
Admission: RE | Admit: 2021-02-07 | Discharge: 2021-02-07 | Disposition: A | Discharge status: Home / Self Care | Payer: PPO | Source: Ambulatory Visit | Attending: Internal Medicine | Admitting: Internal Medicine

## 2021-02-07 ENCOUNTER — Other Ambulatory Visit: Payer: Self-pay

## 2021-02-07 ENCOUNTER — Other Ambulatory Visit: Payer: Self-pay | Admitting: Internal Medicine

## 2021-02-07 DIAGNOSIS — Z1231 Encounter for screening mammogram for malignant neoplasm of breast: Secondary | ICD-10-CM

## 2021-02-07 DIAGNOSIS — Z9889 Other specified postprocedural states: Secondary | ICD-10-CM

## 2021-02-08 ENCOUNTER — Ambulatory Visit: Payer: PPO

## 2021-02-26 ENCOUNTER — Ambulatory Visit
Admission: RE | Admit: 2021-02-26 | Discharge: 2021-02-26 | Disposition: A | Discharge status: Home / Self Care | Payer: PPO | Source: Ambulatory Visit | Attending: Internal Medicine | Admitting: Internal Medicine

## 2021-02-26 ENCOUNTER — Other Ambulatory Visit: Payer: Self-pay

## 2021-02-26 DIAGNOSIS — R922 Inconclusive mammogram: Secondary | ICD-10-CM | POA: Diagnosis not present

## 2021-02-26 DIAGNOSIS — Z9889 Other specified postprocedural states: Secondary | ICD-10-CM

## 2021-03-01 DIAGNOSIS — H04123 Dry eye syndrome of bilateral lacrimal glands: Secondary | ICD-10-CM | POA: Diagnosis not present

## 2021-03-18 ENCOUNTER — Other Ambulatory Visit: Payer: Self-pay

## 2021-03-18 ENCOUNTER — Ambulatory Visit (INDEPENDENT_AMBULATORY_CARE_PROVIDER_SITE_OTHER): Payer: PPO | Admitting: Psychiatry

## 2021-03-18 ENCOUNTER — Encounter: Payer: Self-pay | Admitting: Psychiatry

## 2021-03-18 DIAGNOSIS — F411 Generalized anxiety disorder: Secondary | ICD-10-CM

## 2021-03-18 DIAGNOSIS — F5105 Insomnia due to other mental disorder: Secondary | ICD-10-CM

## 2021-03-18 MED ORDER — GABAPENTIN 300 MG PO CAPS: 300.0000 mg | ORAL_CAPSULE | Freq: Three times a day (TID) | ORAL | 0 refills | Status: DC

## 2021-03-18 MED ORDER — ALPRAZOLAM 0.5 MG PO TABS: 0.2500 mg | ORAL_TABLET | Freq: Two times a day (BID) | ORAL | 1 refills | Status: DC | PRN

## 2021-03-18 MED ORDER — ZOLPIDEM TARTRATE 5 MG PO TABS: 2.5000 mg | ORAL_TABLET | Freq: Every day | ORAL | 2 refills | Status: DC

## 2021-03-18 MED ORDER — BUSPIRONE HCL 10 MG PO TABS: ORAL_TABLET | ORAL | 0 refills | Status: DC

## 2021-03-18 MED ORDER — SERTRALINE HCL 50 MG PO TABS: ORAL_TABLET | ORAL | 0 refills | Status: DC

## 2021-03-18 NOTE — Progress Notes (Signed)
Crucita [name] [id number] 1946-03-01 75 y.o.   Subjective:   Patient ID:  Linda [name] is a 75 y.o. (DOB [birth year]) female.  Chief Complaint:  Chief Complaint  Patient presents with  . Follow-up  . Generalized anxiety disorder  . Anxiety    HPI Linda [name] presents to the office today for follow-up of generalized anxiety disorder. First seen February 21, 2020 Lexapro 5 mg was added to buspirone.  She continued Ambien 5 mg prn.  05/02/2020 appointment the following is noted:  Still anxious without change with Lexapro.  No SE.  Asks about genetic testing.   Takes Xanax only about weekly but wants to continue it.  Ambien 2.5 mg regularluy. Plan: If increase Lexapro does not help then do [facility] testing. Continue Lexapro 10 mg daily Continue buspirone 10 mg 3 times daily Gabapentin 300 TID Xanax 0.5 mg twice daily as needed anxiety.  06/28/2120 appt with the following noted: Doesn't think much difference with Lexapro 10.  Initially sleepy but it resolved.  Chronic worry and can't stop my head.  No SE. Wonders about [employer] Needs Ambien 2.5 mg HS to sleep. Still on others noted and wants better benefit.  Not depressed. Patient reports stable mood and denies depressed or irritable moods.With meds Patient denies difficulty with sleep initiation or maintenance. Denies appetite disturbance.  Patient reports that energy and motivation have been good.  Patient denies any difficulty with concentration.  Patient denies any suicidal ideation. Plan: Rec increase  Lexapro 15 mg daily Continue buspirone 10 mg 3 times daily Xanax 0.5 mg twice daily as needed anxiety. [facility] testing  08/29/20 appt noted: No particular improvements with incrase in Lexapro and is a little more sleepy. Anxiety is about 7/10.  Not depressed. Switch Lexapro to Viibryd 20. Continue buspirone 10 mg 3 times daily Xanax 0.5 mg twice daily as needed anxiety.  10/16/20 appt with following  noted: Sleepiness better off Lexapro 15.  No SE or benefit with Viibryd. No depressed.  Anxiety 5/10.   Sleep is good.   Plan: Increase Viibryd to 30 mg for 2 week then increase to 40 mg daily. Continue buspirone 10 mg 3 times daily Xanax 0.5 mg twice daily as needed anxiety.  12/11/2020 appt with following noted: No sig change anxiety so far.  Rare lightheaded.  Plan: Increase gabapentin 400 TID off label for anxiety Give Viibryd 40 mg more time  03/18/2021 appointment with the following noted: Got lightheaded and forgetful with Viibryd 40 after awhaile and cut dose in 1/2.  Never saw a difference in axiety with the 2 doses.  Brain fog better with less Viibryd. No improvement in anxiety with gabapentin either. Still on buspirone for a long time without much change either.  2 sons responded to Wellbutrin and other on Zoloft with Wellbutrin and asks about it.  Past Psychiatric History:  Therapy [facility]. prescriber Pauline Good & PCP Past Psychiatric Medication Trials:  Buspirone 10 TID NR but a little lightheaded Lexapro 15 sleepy About 5 years ago tried citalopram briefly No Zoloft, paxil, duloxetine, fluoxetine.  Gabapentin 300 TID for neuropathy Xanax. [facility].  Review of Systems:  Review of Systems  Constitutional: Positive for unexpected weight change.  Gastrointestinal: Negative for abdominal pain, constipation, diarrhea and nausea.  Neurological: Negative for dizziness, tremors and weakness.       Neuropathy    Medications: I have reviewed the patient's current medications.  Current Outpatient Medications  Medication Sig Dispense Refill  . acetaminophen (TYLENOL)  500 MG tablet Take 1,000 mg by mouth every 6 (six) hours as needed.    Marland Kitchen anastrozole (ARIMIDEX) 1 MG tablet Take 1 tablet (1 mg total) by mouth daily. 90 tablet 3  . Ascorbic Acid (VITAMIN C) 1000 MG tablet Take 1,000 mg by mouth 2 (two) times daily.    Marland Kitchen b complex vitamins tablet Take 1 tablet by mouth  at bedtime.    . calcium-vitamin D 250-100 MG-UNIT tablet Take 1 tablet by mouth 2 (two) times daily.    Marland Kitchen conjugated estrogens (PREMARIN) vaginal cream Place 1 Applicatorful vaginally 3 (three) times a week. (Patient taking differently: Place 1 Applicatorful vaginally 2 (two) times a week.)    . hydrocortisone (ANUSOL-HC) 2.5 % rectal cream Place 1 application rectally daily as needed for hemorrhoids or anal itching.    . Multiple Vitamin (MULTIVITAMIN WITH MINERALS) TABS tablet Take 1 tablet by mouth at bedtime.    . naproxen sodium (ALEVE) 220 MG tablet Take 440 mg by mouth 2 (two) times daily as needed (for pain.).     Marland Kitchen rosuvastatin (CRESTOR) 10 MG tablet Take 1 tablet (10 mg total) by mouth daily. (Patient taking differently: Take 10 mg by mouth at bedtime.)    . ALPRAZolam (XANAX) 0.5 MG tablet Take 0.5 tablets (0.25 mg total) by mouth 2 (two) times daily as needed for anxiety or sleep. 60 tablet 1  . busPIRone (BUSPAR) 10 MG tablet TAKE 1 TABLET(10 MG) BY MOUTH THREE TIMES DAILY 270 tablet 0  . cycloSPORINE (RESTASIS) 0.05 % ophthalmic emulsion 1 drop 2 (two) times daily. (Patient not taking: Reported on 03/18/2021)    . gabapentin (NEURONTIN) 300 MG capsule Take 1 capsule (300 mg total) by mouth 3 (three) times daily. 270 capsule 0  . sertraline (ZOLOFT) 50 MG tablet 1/2 daily for 1 week then 1 daily 90 tablet 0  . zolpidem (AMBIEN) 5 MG tablet Take 0.5 tablets (2.5 mg total) by mouth at bedtime. 30 tablet 2   No current facility-administered medications for this visit.    Medication Side Effects: None  Allergies:  Allergies  Allergen Reactions  . Other Other (See Comments)    ? Seasonal allergies - classic symptoms    Past Medical History:  Diagnosis Date  . Anxiety   . Arthritis   . Atrophic vaginitis   . Breast cancer ([place])    right (DCIS) s/p lumpectomy 7/15  . Cataract    BILATERAL  . Cervical dysplasia 1975  . DCIS (ductal carcinoma in situ)    RIGHT  . Depression    . Dysrhythmia    PVCs  . Family history of adverse reaction to anesthesia    Pt stated that her mother had difficulty waking up after anesthesia  . Family history of breast cancer   . Family history of colon cancer   . Family history of prostate cancer   . Hx of radiation therapy 08/02/14- 08/24/14   right reast 4256 cGy i 16 sessions, no boost  . Hypercholesteremia   . Insomnia   . Osteopenia 11/2011   T score -1.7 FRAX not calculated due to history of bisphosphonates and current Evista  . Personal history of radiation therapy 2015   right side  . PVC's (premature ventricular contractions)    PMH  . Seasonal allergies   . Skin cancer 2006   basal cell of face    Family History  Problem Relation Age of Onset  . Breast cancer Mother  DIAGNOSED IN HER 80'S  . Hyperlipidemia Mother   . Osteoporosis Mother   . Breast cancer Sister        DIAGNOSED AT AGE 58  . Hypertension Maternal Grandmother   . Breast cancer Maternal Grandmother        possibly breast cancer; had mastectomy in her 30s-40s, but don't know if it was due to cancer  . Heart disease Maternal Grandfather   . Stroke Maternal Grandfather   . Diabetes Son        TYPE 1 DIABETES  . Colon cancer Paternal Grandfather   . Cancer Paternal Grandfather        colon  . Heart failure Son   . Kidney cancer Paternal Grandmother        bladder/kidney cancer  . Prostate cancer Maternal Uncle     Social History   Socioeconomic History  . Marital status: Married    Spouse name: Not on file  . Number of children: Not on file  . Years of education: Not on file  . Highest education level: Not on file  Occupational History  . Not on file  Tobacco Use  . Smoking status: Former Smoker    Quit date: 05/19/1974    Years since quitting: 46.8  . Smokeless tobacco: Never Used  . Tobacco comment: smoked in college  Vaping Use  . Vaping Use: Never used  Substance and Sexual Activity  . Alcohol use: Yes    Alcohol/week:  5.0 standard drinks    Types: 5 Standard drinks or equivalent per week    Comment: daily ETOH cocktail nightly  . Drug use: No  . Sexual activity: Yes    Birth control/protection: Post-menopausal    Comment: G4 P4   Other Topics Concern  . Not on file  Social History Narrative   Pt lives in [place] with spouse.  4 children and 8 grandchildren.   House wife   Social Determinants of Radio broadcast assistant Strain: Not on file  Food Insecurity: Not on file  Transportation Needs: Not on file  Physical Activity: Not on file  Stress: Not on file  Social Connections: Not on file  Intimate Partner Violence: Not on file    Past Medical History, Surgical history, Social history, and Family history were reviewed and updated as appropriate.   Please see review of systems for further details on the patient's review from today.   Objective:   Physical Exam:  There were no vitals taken for this visit.  Physical Exam Constitutional:      General: She is not in acute distress. Musculoskeletal:        General: No deformity.  Neurological:     Mental Status: She is alert and oriented to person, place, and time.     Cranial Nerves: No dysarthria.     Coordination: Coordination normal.  Psychiatric:        Attention and Perception: Attention and perception normal. She does not perceive auditory or visual hallucinations.        Mood and Affect: Mood is anxious. Mood is not depressed. Affect is not labile, blunt, angry, tearful or inappropriate.        Speech: Speech normal.        Behavior: Behavior normal. Behavior is cooperative.        Thought Content: Thought content normal. Thought content is not paranoid or delusional. Thought content does not include homicidal or suicidal ideation. Thought content does not include homicidal or suicidal  plan.        Cognition and Memory: Cognition and memory normal.        Judgment: Judgment normal.     Comments: Insight intact     Lab  Review:     Component Value Date/Time   NA 140 10/01/2018 0628   NA 140 06/30/2017 0904   K 4.1 10/01/2018 0628   CL 110 10/01/2018 0628   CO2 25 10/01/2018 0628   GLUCOSE 90 10/01/2018 0628   BUN 17 10/01/2018 0628   BUN 23 06/30/2017 0904   CREATININE 0.94 10/01/2018 0628   CALCIUM 8.8 (L) 10/01/2018 0628   PROT 6.1 (L) 10/01/2018 0628   ALBUMIN 3.6 10/01/2018 0628   AST 23 10/01/2018 0628   ALT 18 10/01/2018 0628   ALKPHOS 31 (L) 10/01/2018 0628   BILITOT 0.6 10/01/2018 0628   GFRNONAA 59 (L) 10/01/2018 0628   GFRAA >60 10/01/2018 0628       Component Value Date/Time   WBC 5.1 09/26/2020 0942   RBC 4.10 09/26/2020 0942   HGB 13.7 09/26/2020 0942   HCT 42.4 09/26/2020 0942   PLT 231 09/26/2020 0942   MCV 103.4 (H) 09/26/2020 0942   MCH 33.4 09/26/2020 0942   MCHC 32.3 09/26/2020 0942   RDW 12.9 09/26/2020 0942   LYMPHSABS 1.6 06/08/2014 1330   MONOABS 0.6 06/08/2014 1330   EOSABS 0.2 06/08/2014 1330   BASOSABS 0.0 06/08/2014 1330    No results found for: POCLITH, LITHIUM   No results found for: PHENYTOIN, PHENOBARB, VALPROATE, CBMZ   .res Assessment: Plan:    Carolyne was seen today for follow-up, generalized anxiety disorder and anxiety.  Diagnoses and all orders for this visit:  Generalized anxiety disorder -     gabapentin (NEURONTIN) 300 MG capsule; Take 1 capsule (300 mg total) by mouth 3 (three) times daily. -     sertraline (ZOLOFT) 50 MG tablet; 1/2 daily for 1 week then 1 daily -     busPIRone (BUSPAR) 10 MG tablet; TAKE 1 TABLET(10 MG) BY MOUTH THREE TIMES DAILY -     ALPRAZolam (XANAX) 0.5 MG tablet; Take 0.5 tablets (0.25 mg total) by mouth 2 (two) times daily as needed for anxiety or sleep.  Insomnia due to mental condition -     zolpidem (AMBIEN) 5 MG tablet; Take 0.5 tablets (2.5 mg total) by mouth at bedtime.    Discussed diagnosis and treatment plan.  Again.  SSRIs are generally preferred but after Carney Bern site testing we have gone with  Viibryd.  So far at 5 weeks on 40 mg daily she has not noted a significant change but she is tolerating it well.  She had some weight gain of about 7 pounds on Lexapro and has not lost it yet.  [facility] testing results disc at length again.  Suggest Viibryd but never helped after several weeks.   Did genetic testing 08/29/20 per her request..  Again reviewed the results with her in some detail.  This was explored in greater detail today per her request after her review of it independently.  We covered each of the normal and abnormal for [facility] dynamic genes and pharmacokinetic genes as well as which meds are metabolized by which genes and how that is applied.  It was discussed that this is not the complete in total genetic information that is relevant to the use of psychiatric meds but it contains the currently available genetic information.  Option sertraline bc son benefits Option increase buspirone.  Wean Viibryd Dt NR. On 20 mg for 3 weeks Switch to sertraline 25 then 50 mg daily. OK continue AmBien 2.5 helps sleep  Continue buspirone 10 mg 3 times daily ( option wean it, or increase it.) Xanax 0.5 mg 1/2 twice daily as needed anxiety.  Answered questions about Xanax use. We discussed the short-term risks associated with benzodiazepines including sedation and increased fall risk among others.  Discussed long-term side effect risk including dependence, potential withdrawal symptoms, and the potential eventual dose-related risk of dementia.  But recent studies from 2020 dispute this association between benzodiazepines and dementia risk. Newer studies in 2020 do not support an association with dementia.  Reduce gabapentin back to 300 TID off label for anxiety, bc higher dose 400 didn't help. Discussed side effects including dizziness in particular and balance trouble as well as fatigue and potential for weight gain.  FU 8 weeks.  Romana Juniper, MD, DFAPA   Please see After Visit Summary  for patient specific instructions.  Future Appointments  Date Time Provider [place]  05/29/2021  2:30 PM Nunzio Cobbs, MD [facility] None  05/31/2021 11:30 AM Nicholas Lose, MD [facility] None  07/02/2021  7:30 AM [facility] DX DEXA 1 [facility]  01/21/2022 11:45 AM Nicholas Lose, MD [facility] None    No orders of the defined types were placed in this encounter.   -------------------------------

## 2021-03-18 NOTE — Patient Instructions (Addendum)
Reduce Viibryd to 10 mg daily and stop it At the same time, start sertraline 1/2 tablet of 50 mg tablet for 1 week then 1 tablet daily  Reduce gabapentin back to 300 mg daily.

## 2021-03-21 DIAGNOSIS — F411 Generalized anxiety disorder: Secondary | ICD-10-CM | POA: Diagnosis not present

## 2021-04-06 DIAGNOSIS — M20011 Mallet finger of right finger(s): Secondary | ICD-10-CM | POA: Diagnosis not present

## 2021-04-11 DIAGNOSIS — M20011 Mallet finger of right finger(s): Secondary | ICD-10-CM | POA: Diagnosis not present

## 2021-04-12 ENCOUNTER — Other Ambulatory Visit: Payer: Self-pay | Admitting: Psychiatry

## 2021-04-12 DIAGNOSIS — F411 Generalized anxiety disorder: Secondary | ICD-10-CM

## 2021-04-24 DIAGNOSIS — F411 Generalized anxiety disorder: Secondary | ICD-10-CM | POA: Diagnosis not present

## 2021-05-13 ENCOUNTER — Other Ambulatory Visit: Payer: Self-pay | Admitting: Psychiatry

## 2021-05-13 DIAGNOSIS — F411 Generalized anxiety disorder: Secondary | ICD-10-CM

## 2021-05-14 ENCOUNTER — Encounter: Payer: Self-pay | Admitting: Psychiatry

## 2021-05-14 ENCOUNTER — Other Ambulatory Visit: Payer: Self-pay

## 2021-05-14 ENCOUNTER — Ambulatory Visit: Payer: PPO | Admitting: Psychiatry

## 2021-05-14 DIAGNOSIS — F411 Generalized anxiety disorder: Secondary | ICD-10-CM | POA: Diagnosis not present

## 2021-05-14 DIAGNOSIS — F5105 Insomnia due to other mental disorder: Secondary | ICD-10-CM

## 2021-05-14 MED ORDER — SERTRALINE HCL 100 MG PO TABS: 100.0000 mg | ORAL_TABLET | Freq: Every day | ORAL | 0 refills | Status: DC

## 2021-05-14 NOTE — Progress Notes (Signed)
Linda [name] [id number] 07/23/1946 75 y.o.   Subjective:   Patient ID:  Linda [name] is a 75 y.o. (DOB 1946-10-16) female.  Chief Complaint:  Chief Complaint  Patient presents with   Follow-up   Anxiety    HPI Linda [name] presents to the office today for follow-up of generalized anxiety disorder. First seen February 21, 2020 Lexapro 5 mg was added to buspirone.  She continued Ambien 5 mg prn.  05/02/2020 appointment the following is noted:  Still anxious without change with Lexapro.  No SE.  Asks about genetic testing.   Takes Xanax only about weekly but wants to continue it.  Ambien 2.5 mg regularluy. Plan: If increase Lexapro does not help then do [facility] testing. Continue Lexapro 10 mg daily Continue buspirone 10 mg 3 times daily Gabapentin 300 TID Xanax 0.5 mg twice daily as needed anxiety.  06/28/2120 appt with the following noted: Doesn't think much difference with Lexapro 10.  Initially sleepy but it resolved.  Chronic worry and can't stop my head.  No SE. Wonders about [employer] Needs Ambien 2.5 mg HS to sleep. Still on others noted and wants better benefit.  Not depressed. Patient reports stable mood and denies depressed or irritable moods.With meds Patient denies difficulty with sleep initiation or maintenance. Denies appetite disturbance.  Patient reports that energy and motivation have been [name].  Patient denies any difficulty with concentration.  Patient denies any suicidal ideation. Plan: Rec increase  Lexapro 15 mg daily Continue buspirone 10 mg 3 times daily Xanax 0.5 mg twice daily as needed anxiety. [facility] testing  08/29/20 appt noted: No particular improvements with incrase in Lexapro and is a little more sleepy. Anxiety is about 7/10.  Not depressed. Switch Lexapro to Viibryd 20. Continue buspirone 10 mg 3 times daily Xanax 0.5 mg twice daily as needed anxiety.  10/16/20 appt with following noted: Sleepiness better off Lexapro 15.   No SE or benefit with Viibryd. No depressed.  Anxiety 5/10.   Sleep is [name].   Plan: Increase Viibryd to 30 mg for 2 week then increase to 40 mg daily. Continue buspirone 10 mg 3 times daily Xanax 0.5 mg twice daily as needed anxiety.  12/11/2020 appt with following noted: No sig change anxiety so far.  Rare lightheaded.  Plan: Increase gabapentin 400 TID off label for anxiety Give Viibryd 40 mg more time  03/18/2021 appointment with the following noted: Got lightheaded and forgetful with Viibryd 40 after awhaile and cut dose in 1/2.  Never saw a difference in axiety with the 2 doses.  Brain fog better with less Viibryd. No improvement in anxiety with gabapentin either. Still on buspirone for a long time without much change either. Plan: Wean Viibryd Dt NR. On 20 mg for 3 weeks Switch to sertraline 25 then 50 mg daily. Reduce gabapentin to 300 mg TID  05/14/21 appt noted: No SE.  Limited effect from sertraline 50 mg daily. Still anxious.   Wonders about whether should buspirone benefit. ? Effect.  Same dose for 3 years. Sleep fine with Ambien.  2 sons responded to Wellbutrin and other on Zoloft with Wellbutrin and asks about it.  Past Psychiatric History:  Therapy Linda [name]. prescriber Linda [name] & PCP Past Psychiatric Medication Trials:  Buspirone 10 TID NR but a little lightheaded Lexapro 15 sleepy About 5 years ago tried citalopram briefly No Zoloft, paxil, duloxetine, fluoxetine.  Gabapentin 300 TID for neuropathy Xanax. [facility].  Review of Systems:  Review of Systems  Neurological:  Negative for tremors and weakness.   Medications: I have reviewed the patient's current medications.  Current Outpatient Medications  Medication Sig Dispense Refill   acetaminophen (TYLENOL) 500 MG tablet Take 1,000 mg by mouth every 6 (six) hours as needed.     ALPRAZolam (XANAX) 0.5 MG tablet Take 0.5 tablets (0.25 mg total) by mouth 2 (two) times daily as needed for anxiety or  sleep. (Patient taking differently: Take 0.25 mg by mouth 2 (two) times daily as needed for anxiety or sleep. 1/2 tab PRN) 60 tablet 1   anastrozole (ARIMIDEX) 1 MG tablet Take 1 tablet (1 mg total) by mouth daily. 90 tablet 3   Ascorbic Acid (VITAMIN C) 1000 MG tablet Take 1,000 mg by mouth 2 (two) times daily.     b complex vitamins tablet Take 1 tablet by mouth at bedtime.     busPIRone (BUSPAR) 10 MG tablet TAKE 1 TABLET(10 MG) BY MOUTH THREE TIMES DAILY 270 tablet 0   calcium-vitamin D 250-100 MG-UNIT tablet Take 1 tablet by mouth 2 (two) times daily.     conjugated estrogens (PREMARIN) vaginal cream Place 1 Applicatorful vaginally 3 (three) times a week. (Patient taking differently: Place 1 Applicatorful vaginally 2 (two) times a week.)     gabapentin (NEURONTIN) 300 MG capsule Take 1 capsule (300 mg total) by mouth 3 (three) times daily. 270 capsule 0   hydrocortisone (ANUSOL-HC) 2.5 % rectal cream Place 1 application rectally daily as needed for hemorrhoids or anal itching.     Multiple Vitamin (MULTIVITAMIN WITH MINERALS) TABS tablet Take 1 tablet by mouth at bedtime.     rosuvastatin (CRESTOR) 10 MG tablet Take 1 tablet (10 mg total) by mouth daily. (Patient taking differently: Take 10 mg by mouth at bedtime.)     sertraline (ZOLOFT) 100 MG tablet Take 1 tablet (100 mg total) by mouth daily. 90 tablet 0   zolpidem (AMBIEN) 5 MG tablet Take 0.5 tablets (2.5 mg total) by mouth at bedtime. 30 tablet 2   cycloSPORINE (RESTASIS) 0.05 % ophthalmic emulsion 1 drop 2 (two) times daily. (Patient not taking: Reported on 05/14/2021)     naproxen sodium (ALEVE) 220 MG tablet Take 440 mg by mouth 2 (two) times daily as needed (for pain.).  (Patient not taking: Reported on 05/14/2021)     No current facility-administered medications for this visit.    Medication Side Effects: None  Allergies:  Allergies  Allergen Reactions   Other Other (See Comments)    ? Seasonal allergies - classic symptoms     Past Medical History:  Diagnosis Date   Anxiety    Arthritis    Atrophic vaginitis    Breast cancer ([place])    right (DCIS) s/p lumpectomy 7/15   Cataract    BILATERAL   Cervical dysplasia 1975   DCIS (ductal carcinoma in situ)    RIGHT   Depression    Dysrhythmia    PVCs   Family history of adverse reaction to anesthesia    Pt stated that her mother had difficulty waking up after anesthesia   Family history of breast cancer    Family history of colon cancer    Family history of prostate cancer    Hx of radiation therapy 08/02/14- 08/24/14   right reast 4256 cGy i 16 sessions, no boost   Hypercholesteremia    Insomnia    Osteopenia 11/2011   T score -1.7 FRAX not calculated due to history of bisphosphonates and current Evista  Personal history of radiation therapy 2015   right side   PVC's (premature ventricular contractions)    PMH   Seasonal allergies    Skin cancer 2006   basal cell of face    Family History  Problem Relation Age of Onset   Breast cancer Mother        DIAGNOSED IN HER 80'S   Hyperlipidemia Mother    Osteoporosis Mother    Breast cancer Sister        DIAGNOSED AT AGE 10   Hypertension Maternal Grandmother    Breast cancer Maternal Grandmother        possibly breast cancer; had mastectomy in her 30s-40s, but don't know if it was due to cancer   Heart disease Maternal Grandfather    Stroke Maternal Grandfather    Diabetes Son        TYPE 1 DIABETES   Colon cancer Paternal Grandfather    Cancer Paternal Grandfather        colon   Heart failure Son    Kidney cancer Paternal Grandmother        bladder/kidney cancer   Prostate cancer Maternal Uncle     Social History   Socioeconomic History   Marital status: Married    Spouse name: Not on file   Number of children: Not on file   Years of education: Not on file   Highest education level: Not on file  Occupational History   Not on file  Tobacco Use   Smoking status: Former    Pack  years: 0.00    Types: Cigarettes    Quit date: 05/19/1974    Years since quitting: 47.0   Smokeless tobacco: Never   Tobacco comments:    smoked in college  Vaping Use   Vaping Use: Never used  Substance and Sexual Activity   Alcohol use: Yes    Alcohol/week: 5.0 standard drinks    Types: 5 Standard drinks or equivalent per week    Comment: daily ETOH cocktail nightly   Drug use: No   Sexual activity: Yes    Birth control/protection: Post-menopausal    Comment: G4 P4   Other Topics Concern   Not on file  Social History Narrative   Pt lives in [place] with spouse.  4 children and 8 grandchildren.   House wife   Social Determinants of Radio broadcast assistant Strain: Not on file  Food Insecurity: Not on file  Transportation Needs: Not on file  Physical Activity: Not on file  Stress: Not on file  Social Connections: Not on file  Intimate Partner Violence: Not on file    Past Medical History, Surgical history, Social history, and Family history were reviewed and updated as appropriate.   Please see review of systems for further details on the patient's review from today.   Objective:   Physical Exam:  There were no vitals taken for this visit.  Physical Exam Constitutional:      General: She is not in acute distress. Musculoskeletal:        General: No deformity.  Neurological:     Coordination: Coordination normal.  Psychiatric:        Attention and Perception: Attention normal. She does not perceive auditory or visual hallucinations.        Mood and Affect: Mood is anxious. Affect is not labile, blunt, angry or inappropriate.        Speech: Speech normal.        Behavior: Behavior  normal.        Thought Content: Thought content normal.        Cognition and Memory: Cognition normal.        Judgment: Judgment normal.    Lab Review:     Component Value Date/Time   NA 140 10/01/2018 0628   NA 140 06/30/2017 0904   K 4.1 10/01/2018 0628   CL 110  10/01/2018 0628   CO2 25 10/01/2018 0628   GLUCOSE 90 10/01/2018 0628   BUN 17 10/01/2018 0628   BUN 23 06/30/2017 0904   CREATININE 0.94 10/01/2018 0628   CALCIUM 8.8 (L) 10/01/2018 0628   PROT 6.1 (L) 10/01/2018 0628   ALBUMIN 3.6 10/01/2018 0628   AST 23 10/01/2018 0628   ALT 18 10/01/2018 0628   ALKPHOS 31 (L) 10/01/2018 0628   BILITOT 0.6 10/01/2018 0628   GFRNONAA 59 (L) 10/01/2018 0628   GFRAA >60 10/01/2018 0628       Component Value Date/Time   WBC 5.1 09/26/2020 0942   RBC 4.10 09/26/2020 0942   HGB 13.7 09/26/2020 0942   HCT 42.4 09/26/2020 0942   PLT 231 09/26/2020 0942   MCV 103.4 (H) 09/26/2020 0942   MCH 33.4 09/26/2020 0942   MCHC 32.3 09/26/2020 0942   RDW 12.9 09/26/2020 0942   LYMPHSABS 1.6 06/08/2014 1330   MONOABS 0.6 06/08/2014 1330   EOSABS 0.2 06/08/2014 1330   BASOSABS 0.0 06/08/2014 1330    No results found for: POCLITH, LITHIUM   No results found for: PHENYTOIN, PHENOBARB, VALPROATE, CBMZ   .res Assessment: Plan:    Linda [name] was seen today for follow-up and anxiety.  Diagnoses and all orders for this visit:  Generalized anxiety disorder -     sertraline (ZOLOFT) 100 MG tablet; Take 1 tablet (100 mg total) by mouth daily.  Insomnia due to mental condition   Discussed diagnosis and treatment plan.  Again.  SSRIs are generally preferred but after Linda [name] site testing we have gone with Viibryd.  So far at 5 weeks on 40 mg daily she has not noted a significant change but she is tolerating it well.  She had some weight gain of about 7 pounds on Lexapro and has not lost it yet.  [facility] testing results disc at length again.  Suggest Viibryd but never helped after several weeks.   Did genetic testing 08/29/20 per her request..  Again reviewed the results with her in some detail.  This was explored in greater detail today per her request after her review of it independently.  We covered each of the normal and abnormal for Linda [name] dynamic genes and  pharmacokinetic genes as well as which meds are metabolized by which genes and how that is applied.  It was discussed that this is not the complete in total genetic information that is relevant to the use of psychiatric meds but it contains the currently available genetic information.  Option sertraline bc son benefits Option increase buspirone.  Increase sertraline to 100 mg daily. OK continue AmBien 2.5 helps sleep  Continue buspirone 10 mg 3 times daily ( option wean it, or increase it.) Option wean buspirone once benefit of sertraline. Xanax 0.5 mg 1/2 twice daily as needed anxiety.  Answered questions about Xanax use. Worries over dependence and disc use up to 4 days/week. We discussed the short-term risks associated with benzodiazepines including sedation and increased fall risk among others.  Discussed long-term side effect risk including dependence, potential withdrawal symptoms, and the potential  eventual dose-related risk of dementia.  But recent studies from 2020 dispute this association between benzodiazepines and dementia risk. Newer studies in 2020 do not support an association with dementia.  Continue gabapentin back to 300 TID which she's taking for neuropathy. No sleeepiness it. Discussed side effects including dizziness in particular and balance trouble as well as fatigue and potential for weight gain.  FU 8 weeks.  Lynder Parents, MD, DFAPA   Please see After Visit Summary for patient specific instructions.  Future Appointments  Date Time Provider [place]  05/29/2021  2:30 PM Nunzio Cobbs, MD [facility] None  05/31/2021 11:30 AM Nicholas Lose, MD [facility] None  07/02/2021  7:30 AM [facility] DX DEXA 1 [facility]  07/22/2021 10:00 AM Shirley Friar, PA-C [facility]  01/21/2022 11:45 AM Nicholas Lose, MD [facility] None    No orders of the defined types were placed in this encounter.   -------------------------------

## 2021-05-15 ENCOUNTER — Telehealth: Payer: Self-pay | Admitting: Hematology and Oncology

## 2021-05-15 NOTE — Telephone Encounter (Signed)
R/s appt per 6/22 sch msg. Pt aware.  

## 2021-05-16 DIAGNOSIS — M20011 Mallet finger of right finger(s): Secondary | ICD-10-CM | POA: Diagnosis not present

## 2021-05-29 ENCOUNTER — Encounter: Payer: Self-pay | Admitting: Obstetrics and Gynecology

## 2021-05-29 ENCOUNTER — Telehealth: Payer: Self-pay | Admitting: Obstetrics and Gynecology

## 2021-05-29 ENCOUNTER — Ambulatory Visit (INDEPENDENT_AMBULATORY_CARE_PROVIDER_SITE_OTHER): Payer: PPO | Admitting: Obstetrics and Gynecology

## 2021-05-29 ENCOUNTER — Other Ambulatory Visit (HOSPITAL_COMMUNITY)
Admission: RE | Admit: 2021-05-29 | Discharge: 2021-05-29 | Disposition: A | Discharge status: Home / Self Care | Payer: PPO | Source: Ambulatory Visit | Attending: Obstetrics and Gynecology | Admitting: Obstetrics and Gynecology

## 2021-05-29 ENCOUNTER — Other Ambulatory Visit: Payer: Self-pay

## 2021-05-29 VITALS — BP 100/60 | HR 69 | Ht 66.25 in | Wt 148.0 lb

## 2021-05-29 DIAGNOSIS — Z1151 Encounter for screening for human papillomavirus (HPV): Secondary | ICD-10-CM | POA: Insufficient documentation

## 2021-05-29 DIAGNOSIS — Z01419 Encounter for gynecological examination (general) (routine) without abnormal findings: Secondary | ICD-10-CM | POA: Insufficient documentation

## 2021-05-29 DIAGNOSIS — N6320 Unspecified lump in the left breast, unspecified quadrant: Secondary | ICD-10-CM

## 2021-05-29 DIAGNOSIS — Z008 Encounter for other general examination: Secondary | ICD-10-CM

## 2021-05-29 DIAGNOSIS — Z853 Personal history of malignant neoplasm of breast: Secondary | ICD-10-CM | POA: Diagnosis not present

## 2021-05-29 DIAGNOSIS — Z9289 Personal history of other medical treatment: Secondary | ICD-10-CM

## 2021-05-29 DIAGNOSIS — Z124 Encounter for screening for malignant neoplasm of cervix: Secondary | ICD-10-CM

## 2021-05-29 DIAGNOSIS — Z1239 Encounter for other screening for malignant neoplasm of breast: Secondary | ICD-10-CM

## 2021-05-29 DIAGNOSIS — F411 Generalized anxiety disorder: Secondary | ICD-10-CM | POA: Diagnosis not present

## 2021-05-29 NOTE — Telephone Encounter (Signed)
Please schedule a left breast dx mammogram and left breast US at the [facility].   Patient has a left breast mass (ridge) at 3:00 and a history of right breast DCIS.  Thank you.

## 2021-05-29 NOTE — Progress Notes (Signed)
GYNECOLOGY  VISIT   HPI: 75 y.o.   Married  Caucasian  female   [phone] with No LMP recorded. Patient is postmenopausal.   here for Breast and pelvic. Previous patient of Dr.Gottsegens's.    Patient is here for evaluation and a physical exam.  She is not comfortable with her GYN evaluation earlier this year.   Has dx of  DCIS 10/12/20.  Had lumpectomy, Anastrozole. Sees Dr. Lindi Adie.  Prior dx of DCIS 04/19/2014. Had lumpectomy, XRT, Tamoxifen.       Alternates mammogram and with MRI every 6 months.   Had genetic testing which was negative.      No vaginal bleeding.   Uses Premarin cream if she is feeling uncomfortable.  Uses this once a week, about 0.5 grams.         Had colonoscopy 1 - 1.5 years ago.   Diverticulosis and small fissure.   GYNECOLOGIC HISTORY: No LMP recorded. Patient is postmenopausal. Contraception:  PMP Menopausal hormone therapy: Premarin cream occ. Last mammogram: mmg 02-26-21 diag.Bil.;s/p Rt.Br.lumpectomy11-2021--SEE EPIC Last pap smear: 06/17/18 - neg, neg HR HPV.  12-04-11 Neg--Hx of cryotherapy 40 years ago.  Hx ASUS-H + endometrial cells.  Colposcopy negative.  EMB? BMD - Dr. Lindi Adie monitoring.   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   Lichen plano-pilaris 12/27/2015   Genetic testing 12/20/2014   Family history of breast cancer    Family history of prostate cancer    Family history of colon cancer    Palpitations 10/30/2014   Premature ventricular contraction 10/30/2014   Hyperlipidemia 10/30/2014   Osteopenia 07/10/2014   Malignant neoplasm of lower-inner quadrant of right female breast ([place]) 05/19/2014   Ductal carcinoma in situ (DCIS) of right breast 05/03/2014   Abnormal mammography 03/07/2014   ASCUS  (atypical squamous cells of undetermined significance) on Pap smear 02/16/2014   Unexplained endometrial cells on cervical cytology 02/16/2014   Atrophic vaginitis 01/03/2014   Constipation 01/03/2014   Insomnia 05/19/2013   Cervical dysplasia    Hypercholesteremia    ANXIETY 09/11/2009   ANAL FISSURE 09/11/2009   ARTHRITIS 09/11/2009   DIVERTICULOSIS, COLON 09/06/2009   RECTAL BLEEDING 09/06/2009   RECTAL PAIN 09/06/2009   COLONIC POLYPS, HYPERPLASTIC, HX OF 09/06/2009    Past Medical History:  Diagnosis Date   Anxiety    Arthritis    Atrophic vaginitis    Breast cancer ([place])    right (DCIS) s/p lumpectomy 7/15   Cataract    BILATERAL   Cervical dysplasia 1975   DCIS (ductal carcinoma in situ)    RIGHT   Depression    Dysrhythmia    PVCs   Family history of adverse reaction to anesthesia    Pt stated that her mother had difficulty waking up after anesthesia   Family history of breast cancer    Family history of colon cancer    Family history of prostate cancer    Hx of radiation therapy 08/02/14- 08/24/14  right reast 4256 cGy i 16 sessions, no boost   Hypercholesteremia    Insomnia    Osteopenia 11/2011   T score -1.7 FRAX not calculated due to history of bisphosphonates and current Evista   Personal history of radiation therapy 2015   right side   PVC's (premature ventricular contractions)    PMH   Seasonal allergies    Skin cancer 2006   basal cell of face    Past Surgical History:  Procedure Laterality Date   APPENDECTOMY  1955   BREAST BIOPSY  2010   rt bx   BREAST BIOPSY  2009   rt   BREAST EXCISIONAL BIOPSY Left 2018   [place]   BREAST LUMPECTOMY  1983   BENIGN-rt   BREAST LUMPECTOMY Right 2015   dcis   BREAST LUMPECTOMY WITH RADIOACTIVE SEED AND SENTINEL LYMPH NODE BIOPSY Right 10/12/2020   Procedure: RIGHT BREAST LUMPECTOMY X 2 WITH RADIOACTIVE SEED AND SENTINEL LYMPH NODE BIOPSY;  Surgeon: Rolm Bookbinder, MD;   Location: [facility];  Service: General;  Laterality: Right;   BREAST LUMPECTOMY WITH RADIOACTIVE SEED LOCALIZATION Right 06/13/2014   Procedure: RIGHT BREAST  RADIOACTIVE SEED GUIDED LUMPECTOMY;  Surgeon: Rolm Bookbinder, MD;  Location: [facility];  Service: General;  Laterality: Right;for DCIS   CATARACT EXTRACTION     Both eyes   cataract surgery  2005   COLONOSCOPY     COLPOSCOPY     EXCISION OF BASAL CELL CA FROM [facility]  2006   FACELIFT  2014   [place]  2013   Arthroscopic-rt   RADIOACTIVE SEED GUIDED EXCISIONAL BREAST BIOPSY Left 05/07/2017   Procedure: LEFT RADIOACTIVE SEED GUIDED EXCISIONAL BREAST BIOPSY;  Surgeon: Rolm Bookbinder, MD;  Location: [facility];  Service: General;  Laterality: Left;   REPOSITION OF LENS Right 10/01/2018   Procedure: REPOSITION OF LENS RIGHT EYE;  Surgeon: Jalene Mullet, MD;  Location: [facility];  Service: Ophthalmology;  Laterality: Right;    Current Outpatient Medications  Medication Sig Dispense Refill   ALPRAZolam (XANAX) 0.5 MG tablet Take 0.5 tablets (0.25 mg total) by mouth 2 (two) times daily as needed for anxiety or sleep. (Patient taking differently: Take 0.25 mg by mouth 2 (two) times daily as needed for anxiety or sleep. 1/2 tab PRN) 60 tablet 1   anastrozole (ARIMIDEX) 1 MG tablet Take 1 tablet (1 mg total) by mouth daily. 90 tablet 3   Ascorbic Acid (VITAMIN C) 1000 MG tablet Take 1,000 mg by mouth 2 (two) times daily.     b complex vitamins tablet Take 1 tablet by mouth at bedtime.     busPIRone (BUSPAR) 10 MG tablet TAKE 1 TABLET(10 MG) BY MOUTH THREE TIMES DAILY 270 tablet 0   calcium-vitamin D 250-100 MG-UNIT tablet Take 1 tablet by mouth 2 (two) times daily.     conjugated estrogens (PREMARIN) vaginal cream Place 1 Applicatorful vaginally 3 (three) times a week. (Patient taking differently: Place 1 Applicatorful vaginally 2 (two)  times a week.)     fluocinonide (LIDEX) 0.05 % external solution      fluticasone (FLONASE) 50 MCG/ACT nasal spray 1 spray in each nostril     gabapentin (NEURONTIN) 300 MG capsule Take 1 capsule (300 mg total) by mouth 3 (three) times daily. 270 capsule 0   hydrocortisone (ANUSOL-HC) 2.5 % rectal cream Place 1 application rectally  daily as needed for hemorrhoids or anal itching.     loratadine (CLARITIN) 10 MG tablet Take 10 mg by mouth daily as needed for allergies.     Multiple Vitamin (MULTIVITAMIN WITH MINERALS) TABS tablet Take 1 tablet by mouth at bedtime.     rosuvastatin (CRESTOR) 10 MG tablet Take 1 tablet (10 mg total) by mouth daily. (Patient taking differently: Take 10 mg by mouth at bedtime.)     sertraline (ZOLOFT) 100 MG tablet Take 1 tablet (100 mg total) by mouth daily. 90 tablet 0   zolpidem (AMBIEN) 5 MG tablet Take 0.5 tablets (2.5 mg total) by mouth at bedtime. 30 tablet 2   No current facility-administered medications for this visit.     ALLERGIES: Other  Family History  Problem Relation Age of Onset   Breast cancer Mother        DIAGNOSED IN HER 78'S   Hyperlipidemia Mother    Osteoporosis Mother    Breast cancer Sister        DIAGNOSED AT AGE 62   Hypertension Maternal Grandmother    Breast cancer Maternal Grandmother        possibly breast cancer; had mastectomy in her 30s-40s, but don't know if it was due to cancer   Heart disease Maternal Grandfather    Stroke Maternal Grandfather    Diabetes Son        TYPE 1 DIABETES   Colon cancer Paternal Grandfather    Cancer Paternal Grandfather        colon   Heart failure Son    Kidney cancer Paternal Grandmother        bladder/kidney cancer   Prostate cancer Maternal Uncle     Social History   Socioeconomic History   Marital status: Married    Spouse name: Not on file   Number of children: Not on file   Years of education: Not on file   Highest education level: Not on file  Occupational History    Not on file  Tobacco Use   Smoking status: Former    Pack years: 0.00    Types: Cigarettes    Quit date: 05/19/1974    Years since quitting: 47.0   Smokeless tobacco: Never   Tobacco comments:    smoked in college  Vaping Use   Vaping Use: Never used  Substance and Sexual Activity   Alcohol use: Yes    Alcohol/week: 10.0 standard drinks    Types: 10 Standard drinks or equivalent per week    Comment: daily ETOH cocktail nightly   Drug use: No   Sexual activity: Not Currently    Birth control/protection: Post-menopausal    Comment: G4 P4   Other Topics Concern   Not on file  Social History Narrative   Pt lives in [place] with spouse.  4 children and 8 grandchildren.   House wife   Social Determinants of Radio broadcast assistant Strain: Not on file  Food Insecurity: Not on file  Transportation Needs: Not on file  Physical Activity: Not on file  Stress: Not on file  Social Connections: Not on file  Intimate Partner Violence: Not on file    Review of Systems  All other systems reviewed and are negative.  PHYSICAL EXAMINATION:    BP 100/60   Pulse 69   Ht 5' 6.25" (1.683 m)   Wt 148 lb (67.1 kg)   SpO2 100%   BMI 23.71 kg/m     General appearance: alert, cooperative  and appears stated age Head: Normocephalic, without obvious abnormality, atraumatic Neck: no adenopathy, supple, symmetrical, trachea midline and thyroid normal to inspection and palpation Lungs: clear to auscultation bilaterally Breasts: left - normal appearance, ridge like mass at 3:00 or tenderness, No nipple retraction or dimpling, No nipple discharge or bleeding, No axillary or supraclavicular adenopathy Right - retracted nipple.  Heart: regular rate and rhythm Abdomen: soft, non-tender, no masses,  no organomegaly Extremities: extremities normal, atraumatic, no cyanosis or edema Skin: Skin color, texture, turgor normal. No rashes or lesions Lymph nodes: Cervical, supraclavicular, and  axillary nodes normal. No abnormal inguinal nodes palpated Neurologic: Grossly normal  Pelvic: External genitalia:  no lesions              Urethra:  normal appearing urethra with no masses, tenderness or lesions              Bartholins and Skenes: normal                 Vagina: normal appearing vagina with normal color and discharge, no lesions              Cervix: no lesions                Bimanual Exam:  Uterus:  normal size, contour, position, consistency, mobility, non-tender              Adnexa: no mass, fullness, tenderness              Rectal exam: Yes.  .  Confirms.              Anus:  decreased tone, no lesions  Chaperone was present for exam.  ASSESSMENT  Right breast cancer.   Negative genetic testing.  FH breast cancer mother and sister.  Left breast ridge at 3:00.  Hx ACUS-H pap.   PLAN  Will proceed with dx left mammogram and left breast US.  Pap and HR HPV testing done.  Fu in 2 years and prn.   45 min total time was spent for this patient encounter, including preparation, face-to-face counseling with the patient, coordination of care, and documentation of the encounter.

## 2021-05-29 NOTE — Patient Instructions (Signed)

## 2021-05-30 NOTE — Telephone Encounter (Signed)
Patient scheduled at the breast center on 07/05/21 @ 8:00 am. Left message for patient to call.

## 2021-05-30 NOTE — Telephone Encounter (Signed)
Patient informed with below note. Aware she can call daily/weekly to check for any cancellations.

## 2021-05-31 ENCOUNTER — Ambulatory Visit: Payer: PPO | Admitting: Hematology and Oncology

## 2021-06-04 LAB — CYTOLOGY - PAP
Comment: NEGATIVE
Diagnosis: NEGATIVE
High risk HPV: NEGATIVE

## 2021-06-12 DIAGNOSIS — H35371 Puckering of macula, right eye: Secondary | ICD-10-CM | POA: Diagnosis not present

## 2021-06-12 DIAGNOSIS — H04123 Dry eye syndrome of bilateral lacrimal glands: Secondary | ICD-10-CM | POA: Diagnosis not present

## 2021-06-12 DIAGNOSIS — H11221 Conjunctival granuloma, right eye: Secondary | ICD-10-CM | POA: Diagnosis not present

## 2021-06-12 DIAGNOSIS — H11431 Conjunctival hyperemia, right eye: Secondary | ICD-10-CM | POA: Diagnosis not present

## 2021-06-12 NOTE — Progress Notes (Signed)
Patient Care Team: Ginger Organ., MD as PCP - General (Internal Medicine) Thompson Grayer, MD as PCP - Cardiology (Cardiology) Mauro Kaufmann, RN as Oncology Nurse Navigator Rockwell Germany, RN as Oncology Nurse Navigator  DIAGNOSIS:    ICD-10-CM   1. Malignant neoplasm of lower-inner quadrant of right breast of female, estrogen receptor positive ([place])  C50.311    Z17.0       SUMMARY OF ONCOLOGIC HISTORY: Oncology History  Malignant neoplasm of lower-inner quadrant of right female breast ([place])  02/03/2014 Breast MRI   7 X 6X 5 mm right breast abnormality as 2 immediately adjacent 3 mm foci of enhancement    05/19/2014 Initial Diagnosis   Malignant neoplasm of lower-inner quadrant of female breast DCIS    06/13/2014 Surgery   R Lumpectomy DCIS 2 mm size Grade 1 Er 70%, PR 80%, Margins Neg    07/25/2014 - 08/25/2014 Radiation Therapy   Adjuvant radiation therapy    10/18/2014 Procedure   genetic testing was normal no mutations identified    05/07/2017 Surgery   Left lumpectomy: Complex sclerosing lesion with usual ductal hyperplasia, focal atypical lobular hyperplasia, no invasive cancer identified   08/10/2017 - 06/02/2019 Anti-estrogen oral therapy   Tamoxifen 20 mg daily x5 years   08/11/2018 Breast MRI   Persistent non-mass enhancement within the left LOQ at anterior depth, at the site of patient's earlier MRI-guided biopsy revealing complex sclerosing lesion, atypical lobular hyperplasia and pseudoangiomatous stromal hyperplasia. This non-mass enhancement is not significantly changed in extent compared to the previous MRI of 02/05/2017 (pre biopsy). No evidence of malignancy within right breast.      09/04/2020 Relapse/Recurrence   Right breast biopsy medial: IDC with DCIS, grade 2, ER greater than 95%, PR 30%, Ki-67 20%, insufficient tumor for HER-2 testing   10/12/2020 Surgery   Right lumpectomy Donne Hazel): intermediate grade DCIS, clear margins, 1 right axillary  lymph node negative for carcinoma.   10/24/2020 -  Anti-estrogen oral therapy   Anastrozole     CHIEF COMPLIANT: Follow-up of right breast cancer on anastrozole  INTERVAL HISTORY: Linda [name] is a 75 y.o. with above-mentioned history of recurrent right breast cancer who underwent a lumpectomy and is currently on antiestrogen therapy with anastrozole. Mammogram on 02/26/21 showed no evidence of malignancy bilaterally. She presents to the clinic today for follow-up.  Her right nipple is inverted.  Her gynecologist detected a nodule in the left breast and they are getting a mammogram to evaluate that.  ALLERGIES:  is allergic to other.  MEDICATIONS:  Current Outpatient Medications  Medication Sig Dispense Refill   ALPRAZolam (XANAX) 0.5 MG tablet Take 0.5 tablets (0.25 mg total) by mouth 2 (two) times daily as needed for anxiety or sleep. (Patient taking differently: Take 0.25 mg by mouth 2 (two) times daily as needed for anxiety or sleep. 1/2 tab PRN) 60 tablet 1   anastrozole (ARIMIDEX) 1 MG tablet Take 1 tablet (1 mg total) by mouth daily. 90 tablet 3   Ascorbic Acid (VITAMIN C) 1000 MG tablet Take 1,000 mg by mouth 2 (two) times daily.     b complex vitamins tablet Take 1 tablet by mouth at bedtime.     busPIRone (BUSPAR) 10 MG tablet TAKE 1 TABLET(10 MG) BY MOUTH THREE TIMES DAILY 270 tablet 0   calcium-vitamin D 250-100 MG-UNIT tablet Take 1 tablet by mouth 2 (two) times daily.     fluocinonide (LIDEX) 0.05 % external solution  fluticasone (FLONASE) 50 MCG/ACT nasal spray 1 spray in each nostril     gabapentin (NEURONTIN) 300 MG capsule Take 1 capsule (300 mg total) by mouth 3 (three) times daily. 270 capsule 0   hydrocortisone (ANUSOL-HC) 2.5 % rectal cream Place 1 application rectally daily as needed for hemorrhoids or anal itching.     loratadine (CLARITIN) 10 MG tablet Take 10 mg by mouth daily as needed for allergies.     Multiple Vitamin (MULTIVITAMIN WITH MINERALS) TABS  tablet Take 1 tablet by mouth at bedtime.     rosuvastatin (CRESTOR) 10 MG tablet Take 1 tablet (10 mg total) by mouth daily. (Patient taking differently: Take 10 mg by mouth at bedtime.)     sertraline (ZOLOFT) 100 MG tablet Take 1 tablet (100 mg total) by mouth daily. 90 tablet 0   zolpidem (AMBIEN) 5 MG tablet Take 0.5 tablets (2.5 mg total) by mouth at bedtime. 30 tablet 2   No current facility-administered medications for this visit.    PHYSICAL EXAMINATION: ECOG PERFORMANCE STATUS: 1 - Symptomatic but completely ambulatory  Vitals:   06/13/21 1116  BP: (!) 122/59  Pulse: 72  Resp: 18  Temp: 98 F (36.7 C)  SpO2: 95%   Filed Weights   06/13/21 1116  Weight: 147 lb 3.2 oz (66.8 kg)    BREAST: No palpable masses or nodules in either right or left breasts. No palpable axillary supraclavicular or infraclavicular adenopathy no breast tenderness or nipple discharge. (exam performed in the presence of a chaperone)  LABORATORY DATA:  I have reviewed the data as listed CMP Latest Ref Rng & Units 10/01/2018 08/11/2018 06/30/2017  Glucose 70 - 99 mg/dL 90 - 92  BUN 8 - 23 mg/dL 17 - 23  Creatinine 0.44 - 1.00 mg/dL 0.94 1.10(H) 0.87  Sodium 135 - 145 mmol/L 140 - 140  Potassium 3.5 - 5.1 mmol/L 4.1 - 4.7  Chloride 98 - 111 mmol/L 110 - 103  CO2 22 - 32 mmol/L 25 - 24  Calcium 8.9 - 10.3 mg/dL 8.8(L) - 9.6  Total Protein 6.5 - 8.1 g/dL 6.1(L) - -  Total Bilirubin 0.3 - 1.2 mg/dL 0.6 - -  Alkaline Phos 38 - 126 U/L 31(L) - -  AST 15 - 41 U/L 23 - -  ALT 0 - 44 U/L 18 - -    Lab Results  Component Value Date   WBC 5.1 09/26/2020   HGB 13.7 09/26/2020   HCT 42.4 09/26/2020   MCV 103.4 (H) 09/26/2020   PLT 231 09/26/2020   NEUTROABS 4.1 06/08/2014    ASSESSMENT & PLAN:  Malignant neoplasm of lower-inner quadrant of right female breast Right breast DCIS ER/PR positive diagnosed March 2015 7 mm focus on MRI and alert lumpectomy 06/13/2014 2 mm focus of DCIS negative margins,  ER 70%, PR 80% positive status post radiation therapy;   05/07/2017:Left lumpectomy: Complex sclerosing lesion with usual ductal hyperplasia, focal atypical lobular hyperplasia, no invasive cancer identified Adjuvant radiation therapy Prior treatment: Tamoxifen 20 mg daily 2015- 2020   Osteopenia: Was previously on Boniva.  Not taking it any further. Calcium and vitamin D Bone density 10 2015: T score -1.8.  Bone Density July 2020: T score -2   Biopsy 09/04/2020: Right breast medial: IDC with DCIS grade 2, ER greater than 95%, PR 30%, Ki-67 20% HER-2 not done because of insufficient tissue; right breast LOQ: DCIS   10/12/2020:Right lumpectomy Donne Hazel): intermediate grade DCIS, clear margins, 1 right axillary lymph node  negative for carcinoma treatment  .    Anastrozole Toxicities: Tolerating it well without any problems or concerns.  Denies any hot flashes or myalgias.    Breast Cancer Surveillance:  Mammograms 02/26/2021: Benign breast density category C breast MRIs (to be done in September 2022). Bone density to be done in August 2022 (we will need to connect after bone density to discuss bone density results and decide if she needs to go on any bisphosphonate therapy)   Return to clinic in 1 year for follow-up.    No orders of the defined types were placed in this encounter.  The patient has a good understanding of the overall plan. she agrees with it. she will call with any problems that may develop before the next visit here.  Total time spent: 20 mins including face to face time and time spent for planning, charting and coordination of care  Rulon Eisenmenger, MD, MPH 06/13/2021  I, Thana Ates, am acting as scribe for Dr. Nicholas Lose.  I have reviewed the above documentation for accuracy and completeness, and I agree with the above.

## 2021-06-13 ENCOUNTER — Inpatient Hospital Stay: Payer: PPO | Attending: Hematology and Oncology | Admitting: Hematology and Oncology

## 2021-06-13 ENCOUNTER — Other Ambulatory Visit: Payer: Self-pay

## 2021-06-13 DIAGNOSIS — M8580 Other specified disorders of bone density and structure, unspecified site: Secondary | ICD-10-CM | POA: Insufficient documentation

## 2021-06-13 DIAGNOSIS — Z79811 Long term (current) use of aromatase inhibitors: Secondary | ICD-10-CM | POA: Insufficient documentation

## 2021-06-13 DIAGNOSIS — Z170 Estrogen receptor positive status [ER+]: Secondary | ICD-10-CM | POA: Diagnosis not present

## 2021-06-13 DIAGNOSIS — M20011 Mallet finger of right finger(s): Secondary | ICD-10-CM | POA: Diagnosis not present

## 2021-06-13 DIAGNOSIS — Z923 Personal history of irradiation: Secondary | ICD-10-CM | POA: Diagnosis not present

## 2021-06-13 DIAGNOSIS — C50311 Malignant neoplasm of lower-inner quadrant of right female breast: Secondary | ICD-10-CM | POA: Diagnosis not present

## 2021-06-13 DIAGNOSIS — Z79899 Other long term (current) drug therapy: Secondary | ICD-10-CM | POA: Insufficient documentation

## 2021-06-13 NOTE — Assessment & Plan Note (Signed)
Right breast DCIS ER/PR positive diagnosed March 2015 7 mm focus on MRI and alert lumpectomy 06/13/2014 2 mm focus of DCIS negative margins, ER 70%, PR 80% positive status post radiation therapy;  05/07/2017:Left lumpectomy: Complex sclerosing lesion with usual ductal hyperplasia, focal atypical lobular hyperplasia, no invasive cancer identified Adjuvant radiation therapy Priortreatment: Tamoxifen 20 mg daily2015-2020  Osteopenia:Was previously on Boniva. Not taking it any further.Calcium and vitamin D Bone density 10 2015: T score -1.8. Bone Density July 2020: T score -2  Biopsy 09/04/2020: Right breast medial: IDC with DCIS grade 2, ER greater than 95%, PR 30%, Ki-67 20%HER-2 not done because of insufficient tissue;right breast LOQ: DCIS  10/12/2020:Right lumpectomy Donne Hazel): intermediate grade DCIS, clear margins, 1 right axillary lymph node negative for carcinomatreatment  .  Anastrozole Toxicities: Tolerating it well without any problems or concerns.  Denies any hot flashes or myalgias.  Breast Cancer Surveillance:  Mammograms 02/26/2021: Benign breast density category C breast MRIs (to be done in September 2022). Bone density to be done in August 2022  Return to clinic in 1 year for follow-up.

## 2021-06-26 DIAGNOSIS — F411 Generalized anxiety disorder: Secondary | ICD-10-CM | POA: Diagnosis not present

## 2021-07-02 ENCOUNTER — Other Ambulatory Visit: Payer: Self-pay

## 2021-07-02 ENCOUNTER — Ambulatory Visit
Admission: RE | Admit: 2021-07-02 | Discharge: 2021-07-02 | Disposition: A | Discharge status: Home / Self Care | Payer: PPO | Source: Ambulatory Visit | Attending: Hematology and Oncology | Admitting: Hematology and Oncology

## 2021-07-02 DIAGNOSIS — Z780 Asymptomatic menopausal state: Secondary | ICD-10-CM | POA: Diagnosis not present

## 2021-07-02 DIAGNOSIS — M8580 Other specified disorders of bone density and structure, unspecified site: Secondary | ICD-10-CM

## 2021-07-02 DIAGNOSIS — M8589 Other specified disorders of bone density and structure, multiple sites: Secondary | ICD-10-CM | POA: Diagnosis not present

## 2021-07-05 ENCOUNTER — Inpatient Hospital Stay: Admission: RE | Admit: 2021-07-05 | Payer: PPO | Source: Ambulatory Visit

## 2021-07-11 ENCOUNTER — Other Ambulatory Visit: Payer: Self-pay | Admitting: Psychiatry

## 2021-07-11 DIAGNOSIS — F411 Generalized anxiety disorder: Secondary | ICD-10-CM

## 2021-07-11 DIAGNOSIS — M20011 Mallet finger of right finger(s): Secondary | ICD-10-CM | POA: Diagnosis not present

## 2021-07-19 DIAGNOSIS — R7301 Impaired fasting glucose: Secondary | ICD-10-CM | POA: Diagnosis not present

## 2021-07-19 DIAGNOSIS — M859 Disorder of bone density and structure, unspecified: Secondary | ICD-10-CM | POA: Diagnosis not present

## 2021-07-19 DIAGNOSIS — E785 Hyperlipidemia, unspecified: Secondary | ICD-10-CM | POA: Diagnosis not present

## 2021-07-21 NOTE — Progress Notes (Signed)
PCP:  Ginger Organ., MD Primary Cardiologist: Thompson Grayer, MD Electrophysiologist: Thompson Grayer, MD   [place] is a 75 y.o. female seen today for Thompson Grayer, MD for routine electrophysiology followup.  Since last being seen in our clinic the patient reports doing well overall. She has "clusters" of palpitations that show PVCs by her apple watch. They are mostly infrequent but do show up to bigeminy at times. She states the most recent cluster was for about 10 days, 3 weeks ago. She has had none since. She was out in the heat more, and doesn't hydrate well. She exercises daily. Caffeine intake is consistent, she knows that it is a trigger for her if she has more than normal.  she denies chest pain, dyspnea, PND, orthopnea, nausea, vomiting, dizziness, syncope, edema, weight gain, or early satiety.  Past Medical History:  Diagnosis Date   Anxiety    Arthritis    Atrophic vaginitis    Breast cancer ([place])    right (DCIS) s/p lumpectomy 7/15   Cataract    BILATERAL   Cervical dysplasia 1975   DCIS (ductal carcinoma in situ)    RIGHT   Depression    Dysrhythmia    PVCs   Family history of adverse reaction to anesthesia    Pt stated that her mother had difficulty waking up after anesthesia   Family history of breast cancer    Family history of colon cancer    Family history of prostate cancer    Hx of radiation therapy 08/02/14- 08/24/14   right reast 4256 cGy i 16 sessions, no boost   Hypercholesteremia    Insomnia    Osteopenia 11/2011   T score -1.7 FRAX not calculated due to history of bisphosphonates and current Evista   Personal history of radiation therapy 2015   right side   PVC's (premature ventricular contractions)    PMH   Seasonal allergies    Skin cancer 2006   basal cell of face   Past Surgical History:  Procedure Laterality Date   APPENDECTOMY  1955   BREAST BIOPSY  2010   rt bx   BREAST BIOPSY  2009   rt   BREAST EXCISIONAL BIOPSY Left 2018    [place]   BREAST LUMPECTOMY  1983   BENIGN-rt   BREAST LUMPECTOMY Right 2015   dcis   BREAST LUMPECTOMY WITH RADIOACTIVE SEED AND SENTINEL LYMPH NODE BIOPSY Right 10/12/2020   Procedure: RIGHT BREAST LUMPECTOMY X 2 WITH RADIOACTIVE SEED AND SENTINEL LYMPH NODE BIOPSY;  Surgeon: Rolm Bookbinder, MD;  Location: [facility];  Service: General;  Laterality: Right;   BREAST LUMPECTOMY WITH RADIOACTIVE SEED LOCALIZATION Right 06/13/2014   Procedure: RIGHT BREAST  RADIOACTIVE SEED GUIDED LUMPECTOMY;  Surgeon: Rolm Bookbinder, MD;  Location: [facility];  Service: General;  Laterality: Right;for DCIS   CATARACT EXTRACTION     Both eyes   cataract surgery  2005   COLONOSCOPY     COLPOSCOPY     EXCISION OF BASAL CELL CA FROM [facility]  2006   FACELIFT  2014   [place]  2013   Arthroscopic-rt   RADIOACTIVE SEED GUIDED EXCISIONAL BREAST BIOPSY Left 05/07/2017   Procedure: LEFT RADIOACTIVE SEED GUIDED EXCISIONAL BREAST BIOPSY;  Surgeon: Rolm Bookbinder, MD;  Location: [facility];  Service: General;  Laterality: Left;   REPOSITION OF  LENS Right 10/01/2018   Procedure: REPOSITION OF LENS RIGHT EYE;  Surgeon: Jalene Mullet, MD;  Location: [facility];  Service: Ophthalmology;  Laterality: Right;    Current Outpatient Medications  Medication Sig Dispense Refill   ALPRAZolam (XANAX) 0.5 MG tablet Take 0.5 tablets (0.25 mg total) by mouth 2 (two) times daily as needed for anxiety or sleep. (Patient taking differently: Take 0.25 mg by mouth 2 (two) times daily as needed for anxiety or sleep. 1/2 tab PRN) 60 tablet 1   anastrozole (ARIMIDEX) 1 MG tablet Take 1 tablet (1 mg total) by mouth daily. 90 tablet 3   Ascorbic Acid (VITAMIN C) 1000 MG tablet Take 1,000 mg by mouth 2 (two) times daily.     b complex vitamins tablet Take 1 tablet by mouth at bedtime.     busPIRone (BUSPAR) 10  MG tablet TAKE 1 TABLET(10 MG) BY MOUTH THREE TIMES DAILY 270 tablet 0   calcium-vitamin D 250-100 MG-UNIT tablet Take 1 tablet by mouth 2 (two) times daily.     fluocinonide (LIDEX) 0.05 % external solution      fluticasone (FLONASE) 50 MCG/ACT nasal spray 1 spray in each nostril     gabapentin (NEURONTIN) 300 MG capsule Take 1 capsule (300 mg total) by mouth 3 (three) times daily. 270 capsule 0   hydrocortisone (ANUSOL-HC) 2.5 % rectal cream Place 1 application rectally daily as needed for hemorrhoids or anal itching.     loratadine (CLARITIN) 10 MG tablet Take 10 mg by mouth daily as needed for allergies.     Multiple Vitamin (MULTIVITAMIN WITH MINERALS) TABS tablet Take 1 tablet by mouth at bedtime.     rosuvastatin (CRESTOR) 10 MG tablet Take 1 tablet (10 mg total) by mouth daily. (Patient taking differently: Take 10 mg by mouth at bedtime.)     sertraline (ZOLOFT) 100 MG tablet Take 1 tablet (100 mg total) by mouth daily. 90 tablet 0   zolpidem (AMBIEN) 5 MG tablet Take 0.5 tablets (2.5 mg total) by mouth at bedtime. 30 tablet 2   No current facility-administered medications for this visit.    Allergies  Allergen Reactions   Other Other (See Comments)    ? Seasonal allergies - classic symptoms    Social History   Socioeconomic History   Marital status: Married    Spouse name: Not on file   Number of children: Not on file   Years of education: Not on file   Highest education level: Not on file  Occupational History   Not on file  Tobacco Use   Smoking status: Former    Types: Cigarettes    Quit date: 05/19/1974    Years since quitting: 47.2   Smokeless tobacco: Never   Tobacco comments:    smoked in college  Vaping Use   Vaping Use: Never used  Substance and Sexual Activity   Alcohol use: Yes    Alcohol/week: 10.0 standard drinks    Types: 10 Standard drinks or equivalent per week    Comment: daily ETOH cocktail nightly   Drug use: No   Sexual activity: Not  Currently    Birth control/protection: Post-menopausal    Comment: G4 P4   Other Topics Concern   Not on file  Social History Narrative   Pt lives in [place] with spouse.  4 children and 8 grandchildren.   House wife   Social Determinants of Radio broadcast assistant Strain: Not on file  Food Insecurity: Not on file  Transportation Needs: Not on file  Physical Activity: Not on file  Stress: Not on file  Social Connections: Not on file  Intimate Partner Violence: Not on file    Review of Systems: All other systems reviewed and are otherwise negative except as noted above.  Physical Exam: Vitals:   07/22/21 0941  BP: 110/70  Pulse: 72  SpO2: 97%  Weight: 148 lb 9.6 oz (67.4 kg)  Height: 5' 0.25" (1.53 m)    GEN- The patient is well appearing, alert and oriented x 3 today.   HEENT: normocephalic, atraumatic; sclera clear, conjunctiva pink; hearing intact; oropharynx clear; neck supple, no JVP Lymph- no cervical lymphadenopathy Lungs- Clear to ausculation bilaterally, normal work of breathing.  No wheezes, rales, rhonchi Heart- Regular rate and rhythm, no murmurs, rubs or gallops, PMI not laterally displaced GI- soft, non-tender, non-distended, bowel sounds present, no hepatosplenomegaly Extremities- no clubbing, cyanosis, or edema; DP/PT/radial pulses 2+ bilaterally MS- no significant deformity or atrophy Skin- warm and dry, no rash or lesion Psych- euthymic mood, full affect Neuro- strength and sensation are intact  EKG is ordered. Personal review of EKG from today shows NSR at 72 bpm, No PVCs  Additional studies reviewed include: Previous EP office notes.   Assessment and Plan:  1. PVCs Re-assurance given. Encouraged hydration and discussed exertion in the heat and poor fluid intake as a possible trigger Hgb, WBC, Cr, K, LFTs, and TSH on WNL last week for PCP visit (patient pulled up on her phone) EF preserved on Echo 2021.  Will continue to follow for now.  She prefers to avoid medications if possible.  Could trial low dose BB if becomes increasingly symptomatic.    2. Breast Cancer S/p lumpectomy and radiation Did not require chemotherapy  RTC 6 months. Sooner with issues.  Shirley Friar, PA-C  07/22/21 10:05 AM

## 2021-07-22 ENCOUNTER — Ambulatory Visit: Payer: PPO | Admitting: Student

## 2021-07-22 ENCOUNTER — Encounter: Payer: Self-pay | Admitting: Student

## 2021-07-22 ENCOUNTER — Other Ambulatory Visit: Payer: Self-pay

## 2021-07-22 VITALS — BP 110/70 | HR 72 | Ht 60.25 in | Wt 148.6 lb

## 2021-07-22 DIAGNOSIS — C50919 Malignant neoplasm of unspecified site of unspecified female breast: Secondary | ICD-10-CM

## 2021-07-22 DIAGNOSIS — I493 Ventricular premature depolarization: Secondary | ICD-10-CM | POA: Diagnosis not present

## 2021-07-22 NOTE — Patient Instructions (Signed)

## 2021-07-25 DIAGNOSIS — F411 Generalized anxiety disorder: Secondary | ICD-10-CM | POA: Diagnosis not present

## 2021-07-25 DIAGNOSIS — G4700 Insomnia, unspecified: Secondary | ICD-10-CM | POA: Diagnosis not present

## 2021-07-25 DIAGNOSIS — M8580 Other specified disorders of bone density and structure, unspecified site: Secondary | ICD-10-CM | POA: Diagnosis not present

## 2021-07-25 DIAGNOSIS — Z1331 Encounter for screening for depression: Secondary | ICD-10-CM | POA: Diagnosis not present

## 2021-07-25 DIAGNOSIS — R7301 Impaired fasting glucose: Secondary | ICD-10-CM | POA: Diagnosis not present

## 2021-07-25 DIAGNOSIS — Z1389 Encounter for screening for other disorder: Secondary | ICD-10-CM | POA: Diagnosis not present

## 2021-07-25 DIAGNOSIS — E785 Hyperlipidemia, unspecified: Secondary | ICD-10-CM | POA: Diagnosis not present

## 2021-07-25 DIAGNOSIS — Z0000 Encounter for general adult medical examination without abnormal findings: Secondary | ICD-10-CM | POA: Diagnosis not present

## 2021-07-25 DIAGNOSIS — C801 Malignant (primary) neoplasm, unspecified: Secondary | ICD-10-CM | POA: Diagnosis not present

## 2021-07-25 DIAGNOSIS — R82998 Other abnormal findings in urine: Secondary | ICD-10-CM | POA: Diagnosis not present

## 2021-07-25 DIAGNOSIS — F1320 Sedative, hypnotic or anxiolytic dependence, uncomplicated: Secondary | ICD-10-CM | POA: Diagnosis not present

## 2021-07-25 DIAGNOSIS — Z23 Encounter for immunization: Secondary | ICD-10-CM | POA: Diagnosis not present

## 2021-07-25 DIAGNOSIS — D692 Other nonthrombocytopenic purpura: Secondary | ICD-10-CM | POA: Diagnosis not present

## 2021-07-25 DIAGNOSIS — G629 Polyneuropathy, unspecified: Secondary | ICD-10-CM | POA: Diagnosis not present

## 2021-08-02 DIAGNOSIS — F411 Generalized anxiety disorder: Secondary | ICD-10-CM | POA: Diagnosis not present

## 2021-08-12 ENCOUNTER — Encounter: Payer: Self-pay | Admitting: Psychiatry

## 2021-08-12 ENCOUNTER — Ambulatory Visit: Payer: PPO | Admitting: Psychiatry

## 2021-08-12 ENCOUNTER — Other Ambulatory Visit: Payer: Self-pay

## 2021-08-12 DIAGNOSIS — H04123 Dry eye syndrome of bilateral lacrimal glands: Secondary | ICD-10-CM | POA: Diagnosis not present

## 2021-08-12 DIAGNOSIS — F411 Generalized anxiety disorder: Secondary | ICD-10-CM

## 2021-08-12 DIAGNOSIS — F5105 Insomnia due to other mental disorder: Secondary | ICD-10-CM

## 2021-08-12 DIAGNOSIS — H524 Presbyopia: Secondary | ICD-10-CM | POA: Diagnosis not present

## 2021-08-12 MED ORDER — SERTRALINE HCL 100 MG PO TABS: 150.0000 mg | ORAL_TABLET | Freq: Every day | ORAL | 0 refills | Status: DC

## 2021-08-12 NOTE — Progress Notes (Signed)
Linda [name] [id number] Apr 23, 1946 75 y.o.   Subjective:   Patient ID:  Linda [name] is a 75 y.o. (DOB 31-Aug-1946) female.  Chief Complaint:  Chief Complaint  Patient presents with   Follow-up   Anxiety   Sleeping Problem    HPI Linda [name] presents to the office today for follow-up of generalized anxiety disorder. First seen February 21, 2020 Lexapro 5 mg was added to buspirone.  She continued Ambien 5 mg prn.  05/02/2020 appointment the following is noted:  Still anxious without change with Lexapro.  No SE.  Asks about genetic testing.   Takes Xanax only about weekly but wants to continue it.  Ambien 2.5 mg regularluy. Plan: If increase Lexapro does not help then do Genesight testing. Continue Lexapro 10 mg daily Continue buspirone 10 mg 3 times daily Gabapentin 300 TID Xanax 0.5 mg twice daily as needed anxiety.  06/28/2120 appt with the following noted: Doesn't think much difference with Lexapro 10.  Initially sleepy but it resolved.  Chronic worry and can't stop my head.  No SE. Wonders about [employer] Needs Ambien 2.5 mg HS to sleep. Still on others noted and wants better benefit.  Not depressed. Patient reports stable mood and denies depressed or irritable moods.With meds Patient denies difficulty with sleep initiation or maintenance. Denies appetite disturbance.  Patient reports that energy and motivation have been good.  Patient denies any difficulty with concentration.  Patient denies any suicidal ideation. Plan: Rec increase  Lexapro 15 mg daily Continue buspirone 10 mg 3 times daily Xanax 0.5 mg twice daily as needed anxiety. Gensight testing  08/29/20 appt noted: No particular improvements with incrase in Lexapro and is a little more sleepy. Anxiety is about 7/10.  Not depressed. Switch Lexapro to Viibryd 20. Continue buspirone 10 mg 3 times daily Xanax 0.5 mg twice daily as needed anxiety.  10/16/20 appt with following noted: Sleepiness  better off Lexapro 15.  No SE or benefit with Viibryd. No depressed.  Anxiety 5/10.   Sleep is good.   Plan: Increase Viibryd to 30 mg for 2 week then increase to 40 mg daily. Continue buspirone 10 mg 3 times daily Xanax 0.5 mg twice daily as needed anxiety.  12/11/2020 appt with following noted: No sig change anxiety so far.  Rare lightheaded.  Plan: Increase gabapentin 400 TID off label for anxiety Give Viibryd 40 mg more time  03/18/2021 appointment with the following noted: Got lightheaded and forgetful with Viibryd 40 after awhaile and cut dose in 1/2.  Never saw a difference in axiety with the 2 doses.  Brain fog better with less Viibryd. No improvement in anxiety with gabapentin either. Still on buspirone for a long time without much change either. Plan: Wean Viibryd Dt NR. On 20 mg for 3 weeks Switch to sertraline 25 then 50 mg daily. Reduce gabapentin to 300 mg TID  05/14/21 appt noted: No SE.  Limited effect from sertraline 50 mg daily. Still anxious.   Wonders about whether should buspirone benefit. ? Effect.  Same dose for 3 years. Sleep fine with Ambien. Plan: Increase sertraline to 100 mg daily. OK continue AmBien 2.5 helps sleep Continue gabapentin 300 mg 3 times daily for gabapentin Continue buspirone 10 mg 3 times daily ( option wean it, or increase it.) Option wean buspirone once benefit of sertraline. Xanax 0.5 mg 1/2 twice daily as needed anxiety.  08/12/2021 appointment with the following noted: I might be a little bit better with increase sertraline  to 100 but no SE.  Willing to increase. Sleep good with Ambien.  Weaned down over the year.s No SE. Xanax average 3 times weekly.   No physical sx of anxiety usually. Asks about propranolol per card for palpitations.   2 sons responded to Wellbutrin and other on Zoloft with Wellbutrin and asks about it.  Past Psychiatric History:  Therapy [facility]ou. prescriber Pauline Good & PCP Past Psychiatric Medication  Trials:  Buspirone 10 TID NR but a little lightheaded Lexapro 15 sleepy Viibryd 40 NR About 5 years ago tried citalopram briefly Zoloft 100, paxil, duloxetine, fluoxetine.  Gabapentin 300 TID for neuropathy Xanax. Genesight.  Review of Systems:  Review of Systems  Cardiovascular:  Negative for palpitations.  Neurological:  Negative for tremors and weakness.   Medications: I have reviewed the patient's current medications.  Current Outpatient Medications  Medication Sig Dispense Refill   ALPRAZolam (XANAX) 0.5 MG tablet Take 0.5 tablets (0.25 mg total) by mouth 2 (two) times daily as needed for anxiety or sleep. (Patient taking differently: Take 0.25 mg by mouth 2 (two) times daily as needed for anxiety or sleep. 1/2 tab PRN) 60 tablet 1   anastrozole (ARIMIDEX) 1 MG tablet Take 1 tablet (1 mg total) by mouth daily. 90 tablet 3   Ascorbic Acid (VITAMIN C) 1000 MG tablet Take 1,000 mg by mouth 2 (two) times daily.     b complex vitamins tablet Take 1 tablet by mouth at bedtime.     busPIRone (BUSPAR) 10 MG tablet TAKE 1 TABLET(10 MG) BY MOUTH THREE TIMES DAILY 270 tablet 0   calcium-vitamin D 250-100 MG-UNIT tablet Take 1 tablet by mouth 2 (two) times daily.     fluocinonide (LIDEX) 0.05 % external solution      fluticasone (FLONASE) 50 MCG/ACT nasal spray 1 spray in each nostril     gabapentin (NEURONTIN) 300 MG capsule Take 1 capsule (300 mg total) by mouth 3 (three) times daily. 270 capsule 0   hydrocortisone (ANUSOL-HC) 2.5 % rectal cream Place 1 application rectally daily as needed for hemorrhoids or anal itching.     loratadine (CLARITIN) 10 MG tablet Take 10 mg by mouth daily as needed for allergies.     Multiple Vitamin (MULTIVITAMIN WITH MINERALS) TABS tablet Take 1 tablet by mouth at bedtime.     rosuvastatin (CRESTOR) 10 MG tablet Take 1 tablet (10 mg total) by mouth daily. (Patient taking differently: Take 10 mg by mouth at bedtime.)     zolpidem (AMBIEN) 5 MG tablet Take 0.5  tablets (2.5 mg total) by mouth at bedtime. 30 tablet 2   sertraline (ZOLOFT) 100 MG tablet Take 1.5 tablets (150 mg total) by mouth daily. 135 tablet 0   No current facility-administered medications for this visit.    Medication Side Effects: None  Allergies:  Allergies  Allergen Reactions   Other Other (See Comments)    ? Seasonal allergies - classic symptoms    Past Medical History:  Diagnosis Date   Anxiety    Arthritis    Atrophic vaginitis    Breast cancer ([place])    right (DCIS) s/p lumpectomy 7/15   Cataract    BILATERAL   Cervical dysplasia 1975   DCIS (ductal carcinoma in situ)    RIGHT   Depression    Dysrhythmia    PVCs   Family history of adverse reaction to anesthesia    Pt stated that her mother had difficulty waking up after anesthesia   Family  history of breast cancer    Family history of colon cancer    Family history of prostate cancer    Hx of radiation therapy 08/02/14- 08/24/14   right reast 4256 cGy i 16 sessions, no boost   Hypercholesteremia    Insomnia    Osteopenia 11/2011   T score -1.7 FRAX not calculated due to history of bisphosphonates and current Evista   Personal history of radiation therapy 2015   right side   PVC's (premature ventricular contractions)    PMH   Seasonal allergies    Skin cancer 2006   basal cell of face    Family History  Problem Relation Age of Onset   Breast cancer Mother        DIAGNOSED IN HER 80'S   Hyperlipidemia Mother    Osteoporosis Mother    Breast cancer Sister        DIAGNOSED AT AGE 56   Hypertension Maternal Grandmother    Breast cancer Maternal Grandmother        possibly breast cancer; had mastectomy in her 30s-40s, but don't know if it was due to cancer   Heart disease Maternal Grandfather    Stroke Maternal Grandfather    Diabetes Son        TYPE 1 DIABETES   Colon cancer Paternal Grandfather    Cancer Paternal Grandfather        colon   Heart failure Son    Kidney cancer Paternal  Grandmother        bladder/kidney cancer   Prostate cancer Maternal Uncle     Social History   Socioeconomic History   Marital status: Married    Spouse name: Not on file   Number of children: Not on file   Years of education: Not on file   Highest education level: Not on file  Occupational History   Not on file  Tobacco Use   Smoking status: Former    Types: Cigarettes    Quit date: 05/19/1974    Years since quitting: 47.2   Smokeless tobacco: Never   Tobacco comments:    smoked in college  Vaping Use   Vaping Use: Never used  Substance and Sexual Activity   Alcohol use: Yes    Alcohol/week: 10.0 standard drinks    Types: 10 Standard drinks or equivalent per week    Comment: daily ETOH cocktail nightly   Drug use: No   Sexual activity: Not Currently    Birth control/protection: Post-menopausal    Comment: G4 P4   Other Topics Concern   Not on file  Social History Narrative   Pt lives in [place] with spouse.  4 children and 8 grandchildren.   House wife   Social Determinants of Radio broadcast assistant Strain: Not on file  Food Insecurity: Not on file  Transportation Needs: Not on file  Physical Activity: Not on file  Stress: Not on file  Social Connections: Not on file  Intimate Partner Violence: Not on file    Past Medical History, Surgical history, Social history, and Family history were reviewed and updated as appropriate.   Please see review of systems for further details on the patient's review from today.   Objective:   Physical Exam:  There were no vitals taken for this visit.  Physical Exam Constitutional:      General: She is not in acute distress. Musculoskeletal:        General: No deformity.  Neurological:  Coordination: Coordination normal.  Psychiatric:        Attention and Perception: Attention normal. She does not perceive auditory or visual hallucinations.        Mood and Affect: Mood is anxious. Affect is not labile,  blunt, angry or inappropriate.        Speech: Speech normal.        Behavior: Behavior normal.        Thought Content: Thought content normal.        Cognition and Memory: Cognition normal.        Judgment: Judgment normal.    Lab Review:     Component Value Date/Time   NA 140 10/01/2018 0628   NA 140 06/30/2017 0904   K 4.1 10/01/2018 0628   CL 110 10/01/2018 0628   CO2 25 10/01/2018 0628   GLUCOSE 90 10/01/2018 0628   BUN 17 10/01/2018 0628   BUN 23 06/30/2017 0904   CREATININE 0.94 10/01/2018 0628   CALCIUM 8.8 (L) 10/01/2018 0628   PROT 6.1 (L) 10/01/2018 0628   ALBUMIN 3.6 10/01/2018 0628   AST 23 10/01/2018 0628   ALT 18 10/01/2018 0628   ALKPHOS 31 (L) 10/01/2018 0628   BILITOT 0.6 10/01/2018 0628   GFRNONAA 59 (L) 10/01/2018 0628   GFRAA >60 10/01/2018 0628       Component Value Date/Time   WBC 5.1 09/26/2020 0942   RBC 4.10 09/26/2020 0942   HGB 13.7 09/26/2020 0942   HCT 42.4 09/26/2020 0942   PLT 231 09/26/2020 0942   MCV 103.4 (H) 09/26/2020 0942   MCH 33.4 09/26/2020 0942   MCHC 32.3 09/26/2020 0942   RDW 12.9 09/26/2020 0942   LYMPHSABS 1.6 06/08/2014 1330   MONOABS 0.6 06/08/2014 1330   EOSABS 0.2 06/08/2014 1330   BASOSABS 0.0 06/08/2014 1330    No results found for: POCLITH, LITHIUM   No results found for: PHENYTOIN, PHENOBARB, VALPROATE, CBMZ   .res Assessment: Plan:    Ailish was seen today for follow-up, anxiety and sleeping problem.  Diagnoses and all orders for this visit:  Generalized anxiety disorder -     sertraline (ZOLOFT) 100 MG tablet; Take 1.5 tablets (150 mg total) by mouth daily.  Insomnia due to mental condition   Discussed diagnosis and treatment plan.  Again.  SSRIs are generally preferred but after Gene Site testing we have gone with Viibryd.  l.  She had some weight gain of about 7 pounds on Lexapro and has not lost it yet.  Genesight testing results disc at length again.  Suggest Viibryd but never helped after  several weeks.   Did genetic testing 08/29/20 per her request..  Have reviewed the results with her in some detail.  This was explored in greater detail today per her request after her review of it independently.  We covered each of the normal and abnormal for [facility] dynamic genes and pharmacokinetic genes as well as which meds are metabolized by which genes and how that is applied.  It was discussed that this is not the complete in total genetic information that is relevant to the use of psychiatric meds but it contains the currently available genetic information.  Option increase  sertraline bc son benefits Option increase buspirone.  Increase sertraline to 150 mg daily. OK continue AmBien 2.5 helps sleep  Increase buspirone 20 mg 3 times daily to see if it could work better. Option wean buspirone once benefit of sertraline. Xanax 0.5 mg 1/2 twice daily as  needed anxiety.  Answered questions about Xanax use. Worries over dependence and disc use up to 4 days/week. We discussed the short-term risks associated with benzodiazepines including sedation and increased fall risk among others.  Discussed long-term side effect risk including dependence, potential withdrawal symptoms, and the potential eventual dose-related risk of dementia.  But recent studies from 2020 dispute this association between benzodiazepines and dementia risk. Newer studies in 2020 do not support an association with dementia.  Continue gabapentin back to 300 TID which she's taking for neuropathy. No sleeepiness it. Discussed side effects including dizziness in particular and balance trouble as well as fatigue and potential for weight gain.  FU 8 weeks.  Lynder Parents, MD, DFAPA   Please see After Visit Summary for patient specific instructions.  Future Appointments  Date Time Provider [place]  08/13/2021  8:50 AM GI-315 MR 1 [id number] [facility]  01/21/2022 10:00 AM Shirley Friar, PA-C [email or web address]  06/13/2022 11:45 AM Nicholas Lose, MD [facility] None    No orders of the defined types were placed in this encounter.   -------------------------------

## 2021-08-13 ENCOUNTER — Ambulatory Visit
Admission: RE | Admit: 2021-08-13 | Discharge: 2021-08-13 | Disposition: A | Discharge status: Home / Self Care | Payer: PPO | Source: Ambulatory Visit | Attending: Hematology and Oncology | Admitting: Hematology and Oncology

## 2021-08-13 DIAGNOSIS — Z853 Personal history of malignant neoplasm of breast: Secondary | ICD-10-CM | POA: Diagnosis not present

## 2021-08-13 DIAGNOSIS — Z170 Estrogen receptor positive status [ER+]: Secondary | ICD-10-CM

## 2021-08-13 DIAGNOSIS — Z9889 Other specified postprocedural states: Secondary | ICD-10-CM | POA: Diagnosis not present

## 2021-08-13 MED ORDER — GADOBUTROL 1 MMOL/ML IV SOLN
6.0000 mL | Freq: Once | INTRAVENOUS | Status: AC | PRN
  Administered 2021-08-13: 6 mL via INTRAVENOUS

## 2021-08-14 ENCOUNTER — Other Ambulatory Visit: Payer: Self-pay | Admitting: Psychiatry

## 2021-08-14 DIAGNOSIS — F411 Generalized anxiety disorder: Secondary | ICD-10-CM

## 2021-08-15 ENCOUNTER — Encounter: Payer: Self-pay | Admitting: Hematology and Oncology

## 2021-08-16 ENCOUNTER — Telehealth: Payer: Self-pay | Admitting: Hematology and Oncology

## 2021-08-16 NOTE — Telephone Encounter (Signed)
I left a voicemail stating that the breast MRI shows stable findings and therefore continued observation is reasonable.  However if she wishes to undergo surgery there is also a reasonable option.

## 2021-08-21 ENCOUNTER — Other Ambulatory Visit: Payer: PPO

## 2021-08-31 ENCOUNTER — Other Ambulatory Visit: Payer: Self-pay | Admitting: Psychiatry

## 2021-08-31 DIAGNOSIS — F411 Generalized anxiety disorder: Secondary | ICD-10-CM

## 2021-08-31 DIAGNOSIS — F5105 Insomnia due to other mental disorder: Secondary | ICD-10-CM

## 2021-09-02 NOTE — Telephone Encounter (Signed)
Last filled 9/10 appt on 11/29

## 2021-09-06 DIAGNOSIS — F411 Generalized anxiety disorder: Secondary | ICD-10-CM | POA: Diagnosis not present

## 2021-10-06 ENCOUNTER — Other Ambulatory Visit: Payer: Self-pay | Admitting: Hematology and Oncology

## 2021-10-22 ENCOUNTER — Other Ambulatory Visit: Payer: Self-pay

## 2021-10-22 ENCOUNTER — Encounter: Payer: Self-pay | Admitting: Psychiatry

## 2021-10-22 ENCOUNTER — Ambulatory Visit: Payer: PPO | Admitting: Psychiatry

## 2021-10-22 DIAGNOSIS — F411 Generalized anxiety disorder: Secondary | ICD-10-CM

## 2021-10-22 DIAGNOSIS — F5105 Insomnia due to other mental disorder: Secondary | ICD-10-CM

## 2021-10-22 MED ORDER — SERTRALINE HCL 100 MG PO TABS: 200.0000 mg | ORAL_TABLET | Freq: Every day | ORAL | 0 refills | Status: DC

## 2021-10-22 NOTE — Progress Notes (Signed)
Linda [name] [id number] [birth year] 75 y.o.   Subjective:   Patient ID:  Linda [name] is a 75 y.o. (DOB 18-Sep-1946) female.  Chief Complaint:  Chief Complaint  Patient presents with   Follow-up   Anxiety    HPI Winnifred Dufford presents to the office today for follow-up of generalized anxiety disorder. First seen February 21, 2020 Lexapro 5 mg was added to buspirone.  She continued Ambien 5 mg prn.  05/02/2020 appointment the following is noted:  Still anxious without change with Lexapro.  No SE.  Asks about genetic testing.   Takes Xanax only about weekly but wants to continue it.  Ambien 2.5 mg regularluy. Plan: If increase Lexapro does not help then do Genesight testing. Continue Lexapro 10 mg daily Continue buspirone 10 mg 3 times daily Gabapentin 300 TID Xanax 0.5 mg twice daily as needed anxiety.  06/28/2120 appt with the following noted: Doesn't think much difference with Lexapro 10.  Initially sleepy but it resolved.  Chronic worry and can't stop my head.  No SE. Wonders about [employer] Needs Ambien 2.5 mg HS to sleep. Still on others noted and wants better benefit.  Not depressed. Patient reports stable mood and denies depressed or irritable moods.With meds Patient denies difficulty with sleep initiation or maintenance. Denies appetite disturbance.  Patient reports that energy and motivation have been good.  Patient denies any difficulty with concentration.  Patient denies any suicidal ideation. Plan: Rec increase  Lexapro 15 mg daily Continue buspirone 10 mg 3 times daily Xanax 0.5 mg twice daily as needed anxiety. Gensight testing  08/29/20 appt noted: No particular improvements with incrase in Lexapro and is a little more sleepy. Anxiety is about 7/10.  Not depressed. Switch Lexapro to Viibryd 20. Continue buspirone 10 mg 3 times daily Xanax 0.5 mg twice daily as needed anxiety.  10/16/20 appt with following noted: Sleepiness better off Lexapro 15.   No SE or benefit with Viibryd. No depressed.  Anxiety 5/10.   Sleep is good.   Plan: Increase Viibryd to 30 mg for 2 week then increase to 40 mg daily. Continue buspirone 10 mg 3 times daily Xanax 0.5 mg twice daily as needed anxiety.  12/11/2020 appt with following noted: No sig change anxiety so far.  Rare lightheaded.  Plan: Increase gabapentin 400 TID off label for anxiety Give Viibryd 40 mg more time  03/18/2021 appointment with the following noted: Got lightheaded and forgetful with Viibryd 40 after awhaile and cut dose in 1/2.  Never saw a difference in axiety with the 2 doses.  Brain fog better with less Viibryd. No improvement in anxiety with gabapentin either. Still on buspirone for a long time without much change either. Plan: Wean Viibryd Dt NR. On 20 mg for 3 weeks Switch to sertraline 25 then 50 mg daily. Reduce gabapentin to 300 mg TID  05/14/21 appt noted: No SE.  Limited effect from sertraline 50 mg daily. Still anxious.   Wonders about whether should buspirone benefit. ? Effect.  Same dose for 3 years. Sleep fine with Ambien. Plan: Increase sertraline to 100 mg daily. OK continue AmBien 2.5 helps sleep Continue gabapentin 300 mg 3 times daily for gabapentin Continue buspirone 10 mg 3 times daily ( option wean it, or increase it.) Option wean buspirone once benefit of sertraline. Xanax 0.5 mg 1/2 twice daily as needed anxiety.  08/12/2021 appointment with the following noted: I might be a little bit better with increase sertraline to 100 but no  SE.  Willing to increase. Sleep good with Ambien.  Weaned down over the year.s No SE. Xanax average 3 times weekly.   No physical sx of anxiety usually. Asks about propranolol per card for palpitations.  Plan: Increase sertraline to 150 mg daily. OK continue AmBien 2.5 helps sleep Increase buspirone 20 mg 3 times daily to see if it could work better. Option wean buspirone once benefit of sertraline. Xanax 0.5 mg 1/2 twice  daily as needed anxiety.  10/22/21 appt noted: I think I have noticed a little bit better. SE mild sleepiness. Changed Sertraline 150 mg HS, buspirone 20 mg TID Still has anxiety and wonders if there are more options for her. Xanax really helps. Good sleeper.  Exercise helps.  Walks in the AM Sees anxiety in one of 76 YO GD.  2 sons responded to Wellbutrin and other on Zoloft with Wellbutrin and asks about it.  Past Psychiatric History:  Therapy [facility]. prescriber Pauline Good & PCP Past Psychiatric Medication Trials:  Buspirone 10 TID NR but a little lightheaded Lexapro 15 sleepy Viibryd 40 NR About 5 years ago tried citalopram briefly Zoloft 100, paxil, duloxetine, fluoxetine.  Gabapentin 300 TID for neuropathy Xanax. Genesight.  Review of Systems:  Review of Systems  Cardiovascular:  Negative for chest pain and palpitations.  Neurological:  Negative for tremors and weakness.   Medications: I have reviewed the patient's current medications.  Current Outpatient Medications  Medication Sig Dispense Refill   ALPRAZolam (XANAX) 0.5 MG tablet Take 0.5 tablets (0.25 mg total) by mouth 2 (two) times daily as needed for anxiety or sleep. (Patient taking differently: Take 0.25 mg by mouth 2 (two) times daily as needed for anxiety or sleep. 1/2 tab PRN) 60 tablet 1   anastrozole (ARIMIDEX) 1 MG tablet TAKE 1 TABLET(1 MG) BY MOUTH DAILY 90 tablet 3   Ascorbic Acid (VITAMIN C) 1000 MG tablet Take 1,000 mg by mouth 2 (two) times daily.     b complex vitamins tablet Take 1 tablet by mouth at bedtime.     busPIRone (BUSPAR) 10 MG tablet TAKE 1 TABLET(10 MG) BY MOUTH THREE TIMES DAILY 270 tablet 0   calcium-vitamin D 250-100 MG-UNIT tablet Take 1 tablet by mouth 2 (two) times daily.     fluocinonide (LIDEX) 0.05 % external solution      fluticasone (FLONASE) 50 MCG/ACT nasal spray 1 spray in each nostril     gabapentin (NEURONTIN) 300 MG capsule TAKE 1 CAPSULE(300 MG) BY MOUTH THREE  TIMES DAILY 270 capsule 0   hydrocortisone (ANUSOL-HC) 2.5 % rectal cream Place 1 application rectally daily as needed for hemorrhoids or anal itching.     loratadine (CLARITIN) 10 MG tablet Take 10 mg by mouth daily as needed for allergies.     Multiple Vitamin (MULTIVITAMIN WITH MINERALS) TABS tablet Take 1 tablet by mouth at bedtime.     rosuvastatin (CRESTOR) 10 MG tablet Take 1 tablet (10 mg total) by mouth daily. (Patient taking differently: Take 10 mg by mouth at bedtime.)     zolpidem (AMBIEN) 5 MG tablet TAKE 1/2 TABLET(2.5 MG) BY MOUTH AT BEDTIME 30 tablet 1   sertraline (ZOLOFT) 100 MG tablet Take 2 tablets (200 mg total) by mouth daily. 180 tablet 0   No current facility-administered medications for this visit.    Medication Side Effects: None  Allergies:  Allergies  Allergen Reactions   Other Other (See Comments)    ? Seasonal allergies - classic symptoms  Past Medical History:  Diagnosis Date   Anxiety    Arthritis    Atrophic vaginitis    Breast cancer ([place])    right (DCIS) s/p lumpectomy 7/15   Cataract    BILATERAL   Cervical dysplasia 1975   DCIS (ductal carcinoma in situ)    RIGHT   Depression    Dysrhythmia    PVCs   Family history of adverse reaction to anesthesia    Pt stated that her mother had difficulty waking up after anesthesia   Family history of breast cancer    Family history of colon cancer    Family history of prostate cancer    Hx of radiation therapy 08/02/14- 08/24/14   right reast 4256 cGy i 16 sessions, no boost   Hypercholesteremia    Insomnia    Osteopenia 11/2011   T score -1.7 FRAX not calculated due to history of bisphosphonates and current Evista   Personal history of radiation therapy 2015   right side   PVC's (premature ventricular contractions)    PMH   Seasonal allergies    Skin cancer 2006   basal cell of face    Family History  Problem Relation Age of Onset   Breast cancer Mother        DIAGNOSED IN HER 80'S    Hyperlipidemia Mother    Osteoporosis Mother    Breast cancer Sister        DIAGNOSED AT AGE 81   Hypertension Maternal Grandmother    Breast cancer Maternal Grandmother        possibly breast cancer; had mastectomy in her 30s-40s, but don't know if it was due to cancer   Heart disease Maternal Grandfather    Stroke Maternal Grandfather    Diabetes Son        TYPE 1 DIABETES   Colon cancer Paternal Grandfather    Cancer Paternal Grandfather        colon   Heart failure Son    Kidney cancer Paternal Grandmother        bladder/kidney cancer   Prostate cancer Maternal Uncle     Social History   Socioeconomic History   Marital status: Married    Spouse name: Not on file   Number of children: Not on file   Years of education: Not on file   Highest education level: Not on file  Occupational History   Not on file  Tobacco Use   Smoking status: Former    Types: Cigarettes    Quit date: 05/19/1974    Years since quitting: 47.4   Smokeless tobacco: Never   Tobacco comments:    smoked in college  Vaping Use   Vaping Use: Never used  Substance and Sexual Activity   Alcohol use: Yes    Alcohol/week: 10.0 standard drinks    Types: 10 Standard drinks or equivalent per week    Comment: daily ETOH cocktail nightly   Drug use: No   Sexual activity: Not Currently    Birth control/protection: Post-menopausal    Comment: G4 P4   Other Topics Concern   Not on file  Social History Narrative   Pt lives in [place] with spouse.  4 children and 8 grandchildren.   House wife   Social Determinants of Radio broadcast assistant Strain: Not on file  Food Insecurity: Not on file  Transportation Needs: Not on file  Physical Activity: Not on file  Stress: Not on file  Social Connections: Not on  file  Intimate Partner Violence: Not on file    Past Medical History, Surgical history, Social history, and Family history were reviewed and updated as appropriate.   Please see review of  systems for further details on the patient's review from today.   Objective:   Physical Exam:  There were no vitals taken for this visit.  Physical Exam Constitutional:      General: She is not in acute distress. Musculoskeletal:        General: No deformity.  Neurological:     Coordination: Coordination normal.  Psychiatric:        Attention and Perception: Attention normal. She does not perceive auditory or visual hallucinations.        Mood and Affect: Mood is anxious. Affect is not labile, blunt, angry or inappropriate.        Speech: Speech normal.        Behavior: Behavior normal.        Thought Content: Thought content normal. Thought content does not include homicidal or suicidal ideation.        Cognition and Memory: Cognition normal.        Judgment: Judgment normal.    Lab Review:     Component Value Date/Time   NA 140 10/01/2018 0628   NA 140 06/30/2017 0904   K 4.1 10/01/2018 0628   CL 110 10/01/2018 0628   CO2 25 10/01/2018 0628   GLUCOSE 90 10/01/2018 0628   BUN 17 10/01/2018 0628   BUN 23 06/30/2017 0904   CREATININE 0.94 10/01/2018 0628   CALCIUM 8.8 (L) 10/01/2018 0628   PROT 6.1 (L) 10/01/2018 0628   ALBUMIN 3.6 10/01/2018 0628   AST 23 10/01/2018 0628   ALT 18 10/01/2018 0628   ALKPHOS 31 (L) 10/01/2018 0628   BILITOT 0.6 10/01/2018 0628   GFRNONAA 59 (L) 10/01/2018 0628   GFRAA >60 10/01/2018 0628       Component Value Date/Time   WBC 5.1 09/26/2020 0942   RBC 4.10 09/26/2020 0942   HGB 13.7 09/26/2020 0942   HCT 42.4 09/26/2020 0942   PLT 231 09/26/2020 0942   MCV 103.4 (H) 09/26/2020 0942   MCH 33.4 09/26/2020 0942   MCHC 32.3 09/26/2020 0942   RDW 12.9 09/26/2020 0942   LYMPHSABS 1.6 06/08/2014 1330   MONOABS 0.6 06/08/2014 1330   EOSABS 0.2 06/08/2014 1330   BASOSABS 0.0 06/08/2014 1330    No results found for: POCLITH, LITHIUM   No results found for: PHENYTOIN, PHENOBARB, VALPROATE, CBMZ   .res Assessment: Plan:     Tiea was seen today for follow-up and anxiety.  Diagnoses and all orders for this visit:  Generalized anxiety disorder -     sertraline (ZOLOFT) 100 MG tablet; Take 2 tablets (200 mg total) by mouth daily.  Insomnia due to mental condition   Discussed diagnosis and treatment plan.  Again.    Genesight testing results disc at length again.  Suggested Viibryd but never helped after several weeks.   Did genetic testing 08/29/20 per her request..  Have reviewed the results with her in some detail.  This was explored in greater detail today per her request after her review of it independently.  We covered each of the normal and abnormal for [facility] dynamic genes and pharmacokinetic genes as well as which meds are metabolized by which genes and how that is applied.  It was discussed that this is not the complete in total genetic information that is relevant to  the use of psychiatric meds but it contains the currently available genetic information.  Some benefit from sertaline 150 mg daily  Option propranolol Option increase  sertraline bc son benefits Option increase buspirone.  Increase sertraline to 200 mg daily. OK continue AmBien 2.5 helps sleep  Continue buspirone 20 mg 3 times daily tfor now . Option wean buspirone once benefit of sertraline. Xanax 0.5 mg 1/2 twice daily as needed anxiety. Option Xanax XR Answered questions about Xanax use. Worries over dependence and disc use up to 4 days/week. We discussed the short-term risks associated with benzodiazepines including sedation and increased fall risk among others.  Discussed long-term side effect risk including dependence, potential withdrawal symptoms, and the potential eventual dose-related risk of dementia.  But recent studies from 2020 dispute this association between benzodiazepines and dementia risk. Newer studies in 2020 do not support an association with dementia.  Continue gabapentin back to 300 TID which she's taking  for neuropathy. No sleeepiness it. Discussed side effects including dizziness in particular and balance trouble as well as fatigue and potential for weight gain.  FU 8 weeks.  Lynder Parents, MD, DFAPA   Please see After Visit Summary for patient specific instructions.  Future Appointments  Date Time Provider [place]  01/21/2022 10:00 AM Shirley Friar, PA-C [facility]  06/13/2022 11:45 AM Nicholas Lose, MD [facility] None    No orders of the defined types were placed in this encounter.   -------------------------------

## 2021-10-30 ENCOUNTER — Other Ambulatory Visit: Payer: Self-pay | Admitting: Psychiatry

## 2021-10-30 DIAGNOSIS — F411 Generalized anxiety disorder: Secondary | ICD-10-CM

## 2021-11-01 ENCOUNTER — Other Ambulatory Visit: Payer: Self-pay | Admitting: Psychiatry

## 2021-11-01 DIAGNOSIS — F411 Generalized anxiety disorder: Secondary | ICD-10-CM

## 2021-11-06 ENCOUNTER — Other Ambulatory Visit: Payer: Self-pay | Admitting: Psychiatry

## 2021-11-06 DIAGNOSIS — F411 Generalized anxiety disorder: Secondary | ICD-10-CM

## 2021-11-26 DIAGNOSIS — C50311 Malignant neoplasm of lower-inner quadrant of right female breast: Secondary | ICD-10-CM | POA: Diagnosis not present

## 2021-11-26 DIAGNOSIS — Z170 Estrogen receptor positive status [ER+]: Secondary | ICD-10-CM | POA: Diagnosis not present

## 2021-11-26 DIAGNOSIS — D0511 Intraductal carcinoma in situ of right breast: Secondary | ICD-10-CM | POA: Diagnosis not present

## 2021-11-29 DIAGNOSIS — F411 Generalized anxiety disorder: Secondary | ICD-10-CM | POA: Diagnosis not present

## 2021-12-02 ENCOUNTER — Other Ambulatory Visit: Payer: Self-pay | Admitting: Psychiatry

## 2021-12-02 DIAGNOSIS — F411 Generalized anxiety disorder: Secondary | ICD-10-CM

## 2021-12-03 ENCOUNTER — Other Ambulatory Visit: Payer: Self-pay

## 2021-12-03 DIAGNOSIS — F411 Generalized anxiety disorder: Secondary | ICD-10-CM

## 2021-12-03 MED ORDER — BUSPIRONE HCL 10 MG PO TABS: ORAL_TABLET | ORAL | 0 refills | Status: DC

## 2021-12-07 ENCOUNTER — Other Ambulatory Visit: Payer: Self-pay | Admitting: Psychiatry

## 2021-12-07 DIAGNOSIS — F411 Generalized anxiety disorder: Secondary | ICD-10-CM

## 2021-12-24 ENCOUNTER — Encounter: Payer: Self-pay | Admitting: Psychiatry

## 2021-12-24 ENCOUNTER — Ambulatory Visit: Payer: PPO | Admitting: Psychiatry

## 2021-12-24 ENCOUNTER — Other Ambulatory Visit: Payer: Self-pay | Admitting: Psychiatry

## 2021-12-24 ENCOUNTER — Other Ambulatory Visit: Payer: Self-pay

## 2021-12-24 DIAGNOSIS — F5105 Insomnia due to other mental disorder: Secondary | ICD-10-CM | POA: Diagnosis not present

## 2021-12-24 DIAGNOSIS — F411 Generalized anxiety disorder: Secondary | ICD-10-CM

## 2021-12-24 MED ORDER — SERTRALINE HCL 100 MG PO TABS: 200.0000 mg | ORAL_TABLET | Freq: Every day | ORAL | 0 refills | Status: DC

## 2021-12-24 MED ORDER — CLONIDINE HCL 0.1 MG PO TABS: ORAL_TABLET | ORAL | 2 refills | Status: DC

## 2021-12-24 NOTE — Patient Instructions (Signed)
Start clonidine 1/2 tablet at night for 3 nights, then 1 at night for 4 nights, then 1/2 in AM and 1 at night Reduce buspirone to 1 tablet 3 times daily for 1 week and stop it.

## 2021-12-24 NOTE — Progress Notes (Signed)
Linda [name] [id number] 09-11-1946 76 y.o.   Subjective:   Patient ID:  Linda [name] is a 76 y.o. (DOB June 21, 1946) female.  Chief Complaint:  Chief Complaint  Patient presents with   Follow-up   Anxiety    HPI Linda [name] presents to the office today for follow-up of generalized anxiety disorder. First seen February 21, 2020 Lexapro 5 mg was added to buspirone.  She continued Ambien 5 mg prn.  05/02/2020 appointment the following is noted:  Still anxious without change with Lexapro.  No SE.  Asks about genetic testing.   Takes Xanax only about weekly but wants to continue it.  Ambien 2.5 mg regularluy. Plan: If increase Lexapro does not help then do Genesight testing. Continue Lexapro 10 mg daily Continue buspirone 10 mg 3 times daily Gabapentin 300 TID Xanax 0.5 mg twice daily as needed anxiety.  06/28/2120 appt with the following noted: Doesn't think much difference with Lexapro 10.  Initially sleepy but it resolved.  Chronic worry and can't stop my head.  No SE. Wonders about [employer] Needs Ambien 2.5 mg HS to sleep. Still on others noted and wants better benefit.  Not depressed. Patient reports stable mood and denies depressed or irritable moods.With meds Patient denies difficulty with sleep initiation or maintenance. Denies appetite disturbance.  Patient reports that energy and motivation have been good.  Patient denies any difficulty with concentration.  Patient denies any suicidal ideation. Plan: Rec increase  Lexapro 15 mg daily Continue buspirone 10 mg 3 times daily Xanax 0.5 mg twice daily as needed anxiety. Gensight testing  08/29/20 appt noted: No particular improvements with incrase in Lexapro and is a little more sleepy. Anxiety is about 7/10.  Not depressed. Switch Lexapro to Viibryd 20. Continue buspirone 10 mg 3 times daily Xanax 0.5 mg twice daily as needed anxiety.  10/16/20 appt with following noted: Sleepiness better off Lexapro 15.   No SE or benefit with Viibryd. No depressed.  Anxiety 5/10.   Sleep is good.   Plan: Increase Viibryd to 30 mg for 2 week then increase to 40 mg daily. Continue buspirone 10 mg 3 times daily Xanax 0.5 mg twice daily as needed anxiety.  12/11/2020 appt with following noted: No sig change anxiety so far.  Rare lightheaded.  Plan: Increase gabapentin 400 TID off label for anxiety Give Viibryd 40 mg more time  03/18/2021 appointment with the following noted: Got lightheaded and forgetful with Viibryd 40 after awhaile and cut dose in 1/2.  Never saw a difference in axiety with the 2 doses.  Brain fog better with less Viibryd. No improvement in anxiety with gabapentin either. Still on buspirone for a long time without much change either. Plan: Wean Viibryd Dt NR. On 20 mg for 3 weeks Switch to sertraline 25 then 50 mg daily. Reduce gabapentin to 300 mg TID  05/14/21 appt noted: No SE.  Limited effect from sertraline 50 mg daily. Still anxious.   Wonders about whether should buspirone benefit. ? Effect.  Same dose for 3 years. Sleep fine with Ambien. Plan: Increase sertraline to 100 mg daily. OK continue AmBien 2.5 helps sleep Continue gabapentin 300 mg 3 times daily for gabapentin Continue buspirone 10 mg 3 times daily ( option wean it, or increase it.) Option wean buspirone once benefit of sertraline. Xanax 0.5 mg 1/2 twice daily as needed anxiety.  08/12/2021 appointment with the following noted: I might be a little bit better with increase sertraline to 100 but no  SE.  Willing to increase. Sleep good with Ambien.  Weaned down over the year.s No SE. Xanax average 3 times weekly.   No physical sx of anxiety usually. Asks about propranolol per card for palpitations.  Plan: Increase sertraline to 150 mg daily. OK continue AmBien 2.5 helps sleep Increase buspirone 20 mg 3 times daily to see if it could work better. Option wean buspirone once benefit of sertraline. Xanax 0.5 mg 1/2 twice  daily as needed anxiety.  10/22/21 appt noted: I think I have noticed a little bit better. SE mild sleepiness. Changed Sertraline 150 mg HS, buspirone 20 mg TID Still has anxiety and wonders if there are more options for her. Xanax really helps. Good sleeper.  Exercise helps.  Walks in the AM Sees anxiety in one of 76 YO GD. Plan: Increase sertraline to 200 mg daily. OK continue AmBien 2.5 helps sleep Continue buspirone 20 mg 3 times daily tfor now . Option wean buspirone once benefit of sertraline. Xanax 0.5 mg 1/2 twice daily as needed anxiety.  12/24/2021 appointment with the following noted: Seeing some improvement with increase sertraline 200 mg daily. Wonders if med interactions might occur. Anxiety usually underlying consistent sort of thing.  With stress may take 1/2 of Xanax if feesl pressured with time, responsibilities.   Anxiety is clearly better about 60% with sertraline. No SE   2 sons responded to Wellbutrin and other on Zoloft with Wellbutrin and asks about it.  Past Psychiatric History:  Therapy [facility] Chou. prescriber Pauline Good & PCP Past Psychiatric Medication Trials:  Buspirone 10 TID NR but a little lightheaded Lexapro 15 sleepy Viibryd 40 NR About 5 years ago tried citalopram briefly Zoloft 100, paxil, duloxetine, fluoxetine.  Gabapentin 300 TID for neuropathy Xanax. Genesight.  Review of Systems:  Review of Systems  Cardiovascular:  Positive for palpitations. Negative for chest pain.  Neurological:  Negative for tremors and weakness.   Medications: I have reviewed the patient's current medications.  Current Outpatient Medications  Medication Sig Dispense Refill   ALPRAZolam (XANAX) 0.5 MG tablet TAKE 1/2 TABLET(0.25 MG) BY MOUTH TWICE DAILY AS NEEDED FOR ANXIETY OR SLEEP 60 tablet 2   anastrozole (ARIMIDEX) 1 MG tablet TAKE 1 TABLET(1 MG) BY MOUTH DAILY 90 tablet 3   Ascorbic Acid (VITAMIN C) 1000 MG tablet Take 1,000 mg by mouth 2 (two) times  daily.     b complex vitamins tablet Take 1 tablet by mouth at bedtime.     busPIRone (BUSPAR) 10 MG tablet TAKE 2 TABLETS (20 MG) BY MOUTH THREE TIMES DAILY 180 tablet 0   calcium-vitamin D 250-100 MG-UNIT tablet Take 1 tablet by mouth 2 (two) times daily.     fluocinonide (LIDEX) 0.05 % external solution      fluticasone (FLONASE) 50 MCG/ACT nasal spray 1 spray in each nostril     gabapentin (NEURONTIN) 300 MG capsule TAKE 1 CAPSULE(300 MG) BY MOUTH THREE TIMES DAILY 270 capsule 0   hydrocortisone (ANUSOL-HC) 2.5 % rectal cream Place 1 application rectally daily as needed for hemorrhoids or anal itching.     loratadine (CLARITIN) 10 MG tablet Take 10 mg by mouth daily as needed for allergies.     Multiple Vitamin (MULTIVITAMIN WITH MINERALS) TABS tablet Take 1 tablet by mouth at bedtime.     rosuvastatin (CRESTOR) 10 MG tablet Take 1 tablet (10 mg total) by mouth daily. (Patient taking differently: Take 10 mg by mouth at bedtime.)     sertraline (ZOLOFT) 100  MG tablet Take 2 tablets (200 mg total) by mouth daily. 180 tablet 0   zolpidem (AMBIEN) 5 MG tablet TAKE 1/2 TABLET(2.5 MG) BY MOUTH AT BEDTIME 30 tablet 1   No current facility-administered medications for this visit.    Medication Side Effects: None  Allergies:  Allergies  Allergen Reactions   Other Other (See Comments)    ? Seasonal allergies - classic symptoms    Past Medical History:  Diagnosis Date   Anxiety    Arthritis    Atrophic vaginitis    Breast cancer ([place])    right (DCIS) s/p lumpectomy 7/15   Cataract    BILATERAL   Cervical dysplasia 1975   DCIS (ductal carcinoma in situ)    RIGHT   Depression    Dysrhythmia    PVCs   Family history of adverse reaction to anesthesia    Pt stated that her mother had difficulty waking up after anesthesia   Family history of breast cancer    Family history of colon cancer    Family history of prostate cancer    Hx of radiation therapy 08/02/14- 08/24/14   right reast  4256 cGy i 16 sessions, no boost   Hypercholesteremia    Insomnia    Osteopenia 11/2011   T score -1.7 FRAX not calculated due to history of bisphosphonates and current Evista   Personal history of radiation therapy 2015   right side   PVC's (premature ventricular contractions)    PMH   Seasonal allergies    Skin cancer 2006   basal cell of face    Family History  Problem Relation Age of Onset   Breast cancer Mother        DIAGNOSED IN HER 80'S   Hyperlipidemia Mother    Osteoporosis Mother    Breast cancer Sister        DIAGNOSED AT AGE 55   Hypertension Maternal Grandmother    Breast cancer Maternal Grandmother        possibly breast cancer; had mastectomy in her 30s-40s, but don't know if it was due to cancer   Heart disease Maternal Grandfather    Stroke Maternal Grandfather    Diabetes Son        TYPE 1 DIABETES   Colon cancer Paternal Grandfather    Cancer Paternal Grandfather        colon   Heart failure Son    Kidney cancer Paternal Grandmother        bladder/kidney cancer   Prostate cancer Maternal Uncle     Social History   Socioeconomic History   Marital status: Married    Spouse name: Not on file   Number of children: Not on file   Years of education: Not on file   Highest education level: Not on file  Occupational History   Not on file  Tobacco Use   Smoking status: Former    Types: Cigarettes    Quit date: 05/19/1974    Years since quitting: 47.6   Smokeless tobacco: Never   Tobacco comments:    smoked in college  Vaping Use   Vaping Use: Never used  Substance and Sexual Activity   Alcohol use: Yes    Alcohol/week: 10.0 standard drinks    Types: 10 Standard drinks or equivalent per week    Comment: daily ETOH cocktail nightly   Drug use: No   Sexual activity: Not Currently    Birth control/protection: Post-menopausal    Comment: G4 P4  Other Topics Concern   Not on file  Social History Narrative   Pt lives in [place] with spouse.   4 children and 8 grandchildren.   House wife   Social Determinants of Radio broadcast assistant Strain: Not on file  Food Insecurity: Not on file  Transportation Needs: Not on file  Physical Activity: Not on file  Stress: Not on file  Social Connections: Not on file  Intimate Partner Violence: Not on file    Past Medical History, Surgical history, Social history, and Family history were reviewed and updated as appropriate.   Please see review of systems for further details on the patient's review from today.   Objective:   Physical Exam:  There were no vitals taken for this visit.  Physical Exam Constitutional:      General: She is not in acute distress. Musculoskeletal:        General: No deformity.  Neurological:     Coordination: Coordination normal.  Psychiatric:        Attention and Perception: Attention normal. She does not perceive auditory or visual hallucinations.        Mood and Affect: Mood is anxious. Affect is not labile, blunt, angry or inappropriate.        Speech: Speech normal.        Behavior: Behavior normal.        Thought Content: Thought content normal. Thought content is not delusional. Thought content does not include homicidal or suicidal ideation.        Cognition and Memory: Cognition normal.        Judgment: Judgment normal.     Comments: Anxity better not gone    Lab Review:     Component Value Date/Time   NA 140 10/01/2018 0628   NA 140 06/30/2017 0904   K 4.1 10/01/2018 0628   CL 110 10/01/2018 0628   CO2 25 10/01/2018 0628   GLUCOSE 90 10/01/2018 0628   BUN 17 10/01/2018 0628   BUN 23 06/30/2017 0904   CREATININE 0.94 10/01/2018 0628   CALCIUM 8.8 (L) 10/01/2018 0628   PROT 6.1 (L) 10/01/2018 0628   ALBUMIN 3.6 10/01/2018 0628   AST 23 10/01/2018 0628   ALT 18 10/01/2018 0628   ALKPHOS 31 (L) 10/01/2018 0628   BILITOT 0.6 10/01/2018 0628   GFRNONAA 59 (L) 10/01/2018 0628   GFRAA >60 10/01/2018 0628       Component  Value Date/Time   WBC 5.1 09/26/2020 0942   RBC 4.10 09/26/2020 0942   HGB 13.7 09/26/2020 0942   HCT 42.4 09/26/2020 0942   PLT 231 09/26/2020 0942   MCV 103.4 (H) 09/26/2020 0942   MCH 33.4 09/26/2020 0942   MCHC 32.3 09/26/2020 0942   RDW 12.9 09/26/2020 0942   LYMPHSABS 1.6 06/08/2014 1330   MONOABS 0.6 06/08/2014 1330   EOSABS 0.2 06/08/2014 1330   BASOSABS 0.0 06/08/2014 1330    No results found for: POCLITH, LITHIUM   No results found for: PHENYTOIN, PHENOBARB, VALPROATE, CBMZ   .res Assessment: Plan:    [place] was seen today for follow-up and anxiety.  Diagnoses and all orders for this visit:  Generalized anxiety disorder  Insomnia due to mental condition    Discussed diagnosis and treatment plan.  Again.  Answered questions about the different mechanisms of different medications and their mechanisms specifically for anxiety including the sertraline, buspirone, clonidine.  Discussed the side effects at length.  Overall she has had about 60% reduction  in anxiety with sertraline 200 mg daily but like to have further improvement if possible.  Genesight testing results disc at length again.  Suggested Viibryd but never helped after several weeks.   Did genetic testing 08/29/20 per her request..  Have reviewed the results with her in some detail.  We covered each of the normal and abnormal for kpharmodynamic genes and pharmacokinetic genes as well as which meds are metabolized by which genes and how that is applied.  It was discussed that this is not the complete in total genetic information that is relevant to the use of psychiatric meds but it contains the currently available genetic information.  Option propranolol  continue sertraline to 200 mg daily. OK continue AmBien 2.5 helps sleep  wean buspirone due to an adequate response Trial clonidine off label for anxiety 0.1 mg tablets 1/2 tablet at night for 3 nights, then 1 at night for 4 nights, then 1/2 in AM and  1 at night Recommend check blood pressure at home to be sure it is okay.  She has a blood pressure cuff. Answered questions about benign PVCs which have not required treatment according to her cardiologist.  She will not combine beta-blockers or other hypertensive medications with clonidine without consent by her cardiologist.  Xanax 0.5 mg 1/2 twice daily as needed anxiety. Option Xanax XR Answered questions about Xanax use. Worries over dependence and disc use up to 4 days/week. We discussed the short-term risks associated with benzodiazepines including sedation and increased fall risk among others.  Discussed long-term side effect risk including dependence, potential withdrawal symptoms, and the potential eventual dose-related risk of dementia.  But recent studies from 2020 dispute this association between benzodiazepines and dementia risk. Newer studies in 2020 do not support an association with dementia.  Continue gabapentin back to 300 TID which she's taking for neuropathy. No sleeepiness it. Discussed side effects including dizziness in particular and balance trouble as well as fatigue and potential for weight gain.  FU 8 weeks.  Lynder Parents, MD, DFAPA   Please see After Visit Summary for patient specific instructions.  Future Appointments  Date Time Provider [place]  01/21/2022 10:00 AM Shirley Friar, PA-C [facility]  06/13/2022 11:45 AM Nicholas Lose, MD [facility] None    No orders of the defined types were placed in this encounter.    -------------------------------

## 2021-12-25 ENCOUNTER — Other Ambulatory Visit: Payer: Self-pay | Admitting: Psychiatry

## 2021-12-25 DIAGNOSIS — F411 Generalized anxiety disorder: Secondary | ICD-10-CM

## 2022-01-07 ENCOUNTER — Encounter (HOSPITAL_COMMUNITY): Payer: Self-pay

## 2022-01-10 DIAGNOSIS — F411 Generalized anxiety disorder: Secondary | ICD-10-CM | POA: Diagnosis not present

## 2022-01-14 ENCOUNTER — Other Ambulatory Visit: Payer: Self-pay | Admitting: Internal Medicine

## 2022-01-14 ENCOUNTER — Other Ambulatory Visit: Payer: Self-pay | Admitting: Hematology and Oncology

## 2022-01-14 DIAGNOSIS — Z9889 Other specified postprocedural states: Secondary | ICD-10-CM

## 2022-01-20 NOTE — Progress Notes (Signed)
PCP:  Ginger Organ., MD Primary Cardiologist: Thompson Grayer, MD Electrophysiologist: Thompson Grayer, MD   Linda [name] is a 76 y.o. female seen today for Thompson Grayer, MD for routine electrophysiology followup.  Since last being seen in our clinic the patient reports doing well overall. Still has palpitations most days. Not life altering or significant.  she denies chest pain, dyspnea, PND, orthopnea, nausea, vomiting, dizziness, syncope, edema, weight gain, or early satiety.  Past Medical History:  Diagnosis Date   Anxiety    Arthritis    Atrophic vaginitis    Breast cancer ([place])    right (DCIS) s/p lumpectomy 7/15   Cataract    BILATERAL   Cervical dysplasia 1975   DCIS (ductal carcinoma in situ)    RIGHT   Depression    Dysrhythmia    PVCs   Family history of adverse reaction to anesthesia    Pt stated that her mother had difficulty waking up after anesthesia   Family history of breast cancer    Family history of colon cancer    Family history of prostate cancer    Hx of radiation therapy 08/02/14- 08/24/14   right reast 4256 cGy i 16 sessions, no boost   Hypercholesteremia    Insomnia    Osteopenia 11/2011   T score -1.7 FRAX not calculated due to history of bisphosphonates and current Evista   Personal history of radiation therapy 2015   right side   PVC's (premature ventricular contractions)    PMH   Seasonal allergies    Skin cancer 2006   basal cell of face   Past Surgical History:  Procedure Laterality Date   APPENDECTOMY  1955   BREAST BIOPSY  2010   rt bx   BREAST BIOPSY  2009   rt   BREAST EXCISIONAL BIOPSY Left 2018   [place]   BREAST LUMPECTOMY  1983   BENIGN-rt   BREAST LUMPECTOMY Right 2015   dcis   BREAST LUMPECTOMY WITH RADIOACTIVE SEED AND SENTINEL LYMPH NODE BIOPSY Right 10/12/2020   Procedure: RIGHT BREAST LUMPECTOMY X 2 WITH RADIOACTIVE SEED AND SENTINEL LYMPH NODE BIOPSY;  Surgeon: Rolm Bookbinder, MD;   Location: [facility];  Service: General;  Laterality: Right;   BREAST LUMPECTOMY WITH RADIOACTIVE SEED LOCALIZATION Right 06/13/2014   Procedure: RIGHT BREAST  RADIOACTIVE SEED GUIDED LUMPECTOMY;  Surgeon: Rolm Bookbinder, MD;  Location: [facility];  Service: General;  Laterality: Right;for DCIS   CATARACT EXTRACTION     Both eyes   cataract surgery  2005   COLONOSCOPY     COLPOSCOPY     EXCISION OF BASAL CELL CA FROM [facility]  2006   FACELIFT  2014   [place]  2013   Arthroscopic-rt   RADIOACTIVE SEED GUIDED EXCISIONAL BREAST BIOPSY Left 05/07/2017   Procedure: LEFT RADIOACTIVE SEED GUIDED EXCISIONAL BREAST BIOPSY;  Surgeon: Rolm Bookbinder, MD;  Location: [place];  Service: General;  Laterality: Left;   REPOSITION OF LENS Right 10/01/2018   Procedure: REPOSITION OF LENS RIGHT EYE;  Surgeon: Jalene Mullet, MD;  Location: [place];  Service: Ophthalmology;  Laterality: Right;    Current Outpatient Medications  Medication Sig Dispense Refill   ALPRAZolam (XANAX) 0.5 MG tablet TAKE 1/2 TABLET(0.25 MG) BY MOUTH TWICE DAILY AS NEEDED FOR ANXIETY OR SLEEP 60 tablet 2   anastrozole (ARIMIDEX) 1  MG tablet TAKE 1 TABLET(1 MG) BY MOUTH DAILY 90 tablet 3   Ascorbic Acid (VITAMIN C) 1000 MG tablet Take 1,000 mg by mouth 2 (two) times daily.     b complex vitamins tablet Take 1 tablet by mouth at bedtime.     calcium-vitamin D 250-100 MG-UNIT tablet Take 1 tablet by mouth 2 (two) times daily.     cloNIDine (CATAPRES) 0.1 MG tablet 1/2 tablet at night for 3 nights, then 1 at night for 4 nights, then 1/2 in AM and 1 at night 45 tablet 2   fluocinonide (LIDEX) 0.05 % external solution      fluticasone (FLONASE) 50 MCG/ACT nasal spray 1 spray in each nostril     gabapentin (NEURONTIN) 300 MG capsule TAKE 1 CAPSULE(300 MG) BY MOUTH THREE TIMES DAILY 180 capsule 0   hydrocortisone (ANUSOL-HC) 2.5 %  rectal cream Place 1 application rectally daily as needed for hemorrhoids or anal itching.     loratadine (CLARITIN) 10 MG tablet Take 10 mg by mouth daily as needed for allergies.     Multiple Vitamin (MULTIVITAMIN WITH MINERALS) TABS tablet Take 1 tablet by mouth at bedtime.     rosuvastatin (CRESTOR) 10 MG tablet Take 1 tablet (10 mg total) by mouth daily. (Patient taking differently: Take 10 mg by mouth at bedtime.)     sertraline (ZOLOFT) 100 MG tablet Take 2 tablets (200 mg total) by mouth daily. 180 tablet 0   zolpidem (AMBIEN) 5 MG tablet TAKE 1/2 TABLET(2.5 MG) BY MOUTH AT BEDTIME 30 tablet 1   No current facility-administered medications for this visit.    Allergies  Allergen Reactions   Other Other (See Comments)    ? Seasonal allergies - classic symptoms    Social History   Socioeconomic History   Marital status: Married    Spouse name: Not on file   Number of children: Not on file   Years of education: Not on file   Highest education level: Not on file  Occupational History   Not on file  Tobacco Use   Smoking status: Former    Types: Cigarettes    Quit date: 05/19/1974    Years since quitting: 47.7   Smokeless tobacco: Never   Tobacco comments:    smoked in college  Vaping Use   Vaping Use: Never used  Substance and Sexual Activity   Alcohol use: Yes    Alcohol/week: 10.0 standard drinks    Types: 10 Standard drinks or equivalent per week    Comment: daily ETOH cocktail nightly   Drug use: No   Sexual activity: Not Currently    Birth control/protection: Post-menopausal    Comment: G4 P4   Other Topics Concern   Not on file  Social History Narrative   Pt lives in [place] with spouse.  4 children and 8 grandchildren.   House wife   Social Determinants of Radio broadcast assistant Strain: Not on file  Food Insecurity: Not on file  Transportation Needs: Not on file  Physical Activity: Not on file  Stress: Not on file  Social Connections: Not on  file  Intimate Partner Violence: Not on file   Review of Systems: All other systems reviewed and are otherwise negative except as noted above.  Physical Exam: Vitals:   01/21/22 1011  BP: 116/62  Pulse: 64  SpO2: 96%  Weight: 146 lb (66.2 kg)  Height: 5\' 7"  (1.702 m)    GEN- The patient is well appearing, alert  and oriented x 3 today.   HEENT: normocephalic, atraumatic; sclera clear, conjunctiva pink; hearing intact; oropharynx clear; neck supple, no JVP Lymph- no cervical lymphadenopathy Lungs- Clear to ausculation bilaterally, normal work of breathing.  No wheezes, rales, rhonchi Heart- Regular rate and rhythm, no murmurs, rubs or gallops, PMI not laterally displaced GI- soft, non-tender, non-distended, bowel sounds present, no hepatosplenomegaly Extremities- no clubbing, cyanosis, or edema; DP/PT/radial pulses 2+ bilaterally MS- no significant deformity or atrophy Skin- warm and dry, no rash or lesion Psych- euthymic mood, full affect Neuro- strength and sensation are intact  EKG is not ordered. Pt has declined medical therapy for PVCs and is stable symptomatically. Will update at next visit (annual)  Additional studies reviewed include: Previous EP office notes.   Assessment and Plan:  1. PVCs Re-assurance given.  Encouraged hydration and discussed exertion in the heat and poor fluid intake as a possible trigger Previously labwork stable.  EF preserved on Echo 2021.  Prefers to avoid medications.    2. Breast Cancer S/p lumpectomy and radiation Did not require chemotherapy No change.  3. Health Screening Her sister recent had a stroke.  She would like a coronary calcium score. We will order today.   Follow up with Dr. Rayann Heman in 6 months   Shirley Friar, PA-C  01/21/22 10:26 AM

## 2022-01-21 ENCOUNTER — Other Ambulatory Visit: Payer: Self-pay

## 2022-01-21 ENCOUNTER — Encounter: Payer: Self-pay | Admitting: Student

## 2022-01-21 ENCOUNTER — Ambulatory Visit: Payer: PPO | Admitting: Hematology and Oncology

## 2022-01-21 ENCOUNTER — Ambulatory Visit: Payer: PPO | Admitting: Student

## 2022-01-21 VITALS — BP 116/62 | HR 64 | Ht 67.0 in | Wt 146.0 lb

## 2022-01-21 DIAGNOSIS — I493 Ventricular premature depolarization: Secondary | ICD-10-CM

## 2022-01-21 DIAGNOSIS — C50919 Malignant neoplasm of unspecified site of unspecified female breast: Secondary | ICD-10-CM

## 2022-01-21 NOTE — Patient Instructions (Signed)
Medication Instructions:  Your physician recommends that you continue on your current medications as directed. Please refer to the Current Medication list given to you today.  *If you need a refill on your cardiac medications before your next appointment, please call your pharmacy*   Lab Work: None If you have labs (blood work) drawn today and your tests are completely normal, you will receive your results only by: [place] (if you have MyChart) OR A paper copy in the mail If you have any lab test that is abnormal or we need to change your treatment, we will call you to review the results.   Testing/Procedures: Calcium Score   Follow-Up: At [facility], you and your health needs are our priority.  As part of our continuing mission to provide you with exceptional heart care, we have created designated Provider Care Teams.  These Care Teams include your primary Cardiologist (physician) and Advanced Practice Providers (APPs -  Physician Assistants and Nurse Practitioners) who all work together to provide you with the care you need, when you need it.  We recommend signing up for the patient portal called "MyChart".  Sign up information is provided on this After Visit Summary.  MyChart is used to connect with patients for Virtual Visits (Telemedicine).  Patients are able to view lab/test results, encounter notes, upcoming appointments, etc.  Non-urgent messages can be sent to your provider as well.   To learn more about what you can do with MyChart, go to [email or web address].    Your next appointment:   6 month(s)  The format for your next appointment:   In Person  Provider:   You may see Thompson Grayer, MD or one of the following Advanced Practice Providers on your designated Care Team:   Tommye Standard, [place] Legrand Como "[facility]" [place], [place]

## 2022-01-28 DIAGNOSIS — L821 Other seborrheic keratosis: Secondary | ICD-10-CM | POA: Diagnosis not present

## 2022-01-28 DIAGNOSIS — D225 Melanocytic nevi of trunk: Secondary | ICD-10-CM | POA: Diagnosis not present

## 2022-01-28 DIAGNOSIS — Z85828 Personal history of other malignant neoplasm of skin: Secondary | ICD-10-CM | POA: Diagnosis not present

## 2022-01-28 DIAGNOSIS — L661 Lichen planopilaris: Secondary | ICD-10-CM | POA: Diagnosis not present

## 2022-01-28 DIAGNOSIS — D2271 Melanocytic nevi of right lower limb, including hip: Secondary | ICD-10-CM | POA: Diagnosis not present

## 2022-02-20 ENCOUNTER — Ambulatory Visit: Payer: PPO | Admitting: Psychiatry

## 2022-02-20 ENCOUNTER — Other Ambulatory Visit: Payer: Self-pay | Admitting: Psychiatry

## 2022-02-20 DIAGNOSIS — F411 Generalized anxiety disorder: Secondary | ICD-10-CM

## 2022-02-21 NOTE — Telephone Encounter (Signed)
Please call to schedule an appt. Canceled her last one.  ?

## 2022-02-27 ENCOUNTER — Ambulatory Visit
Admission: RE | Admit: 2022-02-27 | Discharge: 2022-02-27 | Disposition: A | Discharge status: Home / Self Care | Payer: PPO | Source: Ambulatory Visit | Attending: Hematology and Oncology | Admitting: Hematology and Oncology

## 2022-02-27 DIAGNOSIS — Z9889 Other specified postprocedural states: Secondary | ICD-10-CM

## 2022-02-27 DIAGNOSIS — R922 Inconclusive mammogram: Secondary | ICD-10-CM | POA: Diagnosis not present

## 2022-03-03 ENCOUNTER — Other Ambulatory Visit: Payer: Self-pay | Admitting: Psychiatry

## 2022-03-03 DIAGNOSIS — F411 Generalized anxiety disorder: Secondary | ICD-10-CM

## 2022-03-05 ENCOUNTER — Encounter: Payer: Self-pay | Admitting: Psychiatry

## 2022-03-05 ENCOUNTER — Ambulatory Visit: Payer: PPO | Admitting: Psychiatry

## 2022-03-05 DIAGNOSIS — F5105 Insomnia due to other mental disorder: Secondary | ICD-10-CM | POA: Diagnosis not present

## 2022-03-05 DIAGNOSIS — F411 Generalized anxiety disorder: Secondary | ICD-10-CM

## 2022-03-05 MED ORDER — ZOLPIDEM TARTRATE 5 MG PO TABS: 5.0000 mg | ORAL_TABLET | Freq: Every evening | ORAL | 2 refills | Status: DC | PRN

## 2022-03-05 MED ORDER — ALPRAZOLAM 0.5 MG PO TABS: ORAL_TABLET | ORAL | 2 refills | Status: DC

## 2022-03-05 MED ORDER — CLONIDINE HCL 0.1 MG PO TABS: ORAL_TABLET | ORAL | 2 refills | Status: DC

## 2022-03-05 MED ORDER — SERTRALINE HCL 100 MG PO TABS: 200.0000 mg | ORAL_TABLET | Freq: Every day | ORAL | 0 refills | Status: DC

## 2022-03-05 NOTE — Progress Notes (Signed)
Peyten Punches ?[id number] ?1946-10-27 ?76 y.o. ? ? ?Subjective:  ? ?Patient ID:  Linda [name] is a 76 y.o. (DOB Jul 16, 1946) female. ? ?Chief Complaint:  ?Chief Complaint  ?Patient presents with  ? Follow-up  ? Anxiety  ? Sleeping Problem  ? ? ?HPI ?Linda [name] presents to the office today for follow-up of generalized anxiety disorder. ?First seen February 21, 2020 Lexapro 5 mg was added to buspirone.  She continued Ambien 5 mg prn. ? ?05/02/2020 appointment the following is noted: ? Still anxious without change with Lexapro.  No SE.  ?Asks about genetic testing.   ?Takes Xanax only about weekly but wants to continue it.  ?Ambien 2.5 mg regularluy. ?Plan: If increase Lexapro does not help then do Genesight testing. ?Continue Lexapro 10 mg daily ?Continue buspirone 10 mg 3 times daily ?Gabapentin 300 TID ?Xanax 0.5 mg twice daily as needed anxiety. ? ?06/28/2120 appt with the following noted: ?Doesn't think much difference with Lexapro 10.  Initially sleepy but it resolved.  Chronic worry and can't stop my head.  No SE. ?Wonders about [employer] ?Needs Ambien 2.5 mg HS to sleep. ?Still on others noted and wants better benefit.  Not depressed. ?Patient reports stable mood and denies depressed or irritable moods.With meds Patient denies difficulty with sleep initiation or maintenance. Denies appetite disturbance.  Patient reports that energy and motivation have been good.  Patient denies any difficulty with concentration.  Patient denies any suicidal ideation. ?Plan: Rec increase  Lexapro 15 mg daily ?Continue buspirone 10 mg 3 times daily ?Xanax 0.5 mg twice daily as needed anxiety. ?Gensight testing ? ?08/29/20 appt noted: ?No particular improvements with incrase in Lexapro and is a little more sleepy. ?Anxiety is about 7/10.  Not depressed. ?Switch Lexapro to Viibryd 20. ?Continue buspirone 10 mg 3 times daily ?Xanax 0.5 mg twice daily as needed anxiety. ? ?10/16/20 appt with following noted: ?Sleepiness  better off Lexapro 15.  No SE or benefit with Viibryd. ?No depressed.  Anxiety 5/10.   ?Sleep is good.   ?Plan: Increase Viibryd to 30 mg for 2 week then increase to 40 mg daily. ?Continue buspirone 10 mg 3 times daily ?Xanax 0.5 mg twice daily as needed anxiety. ? ?12/11/2020 appt with following noted: ?No sig change anxiety so far.  ?Rare lightheaded.  ?Plan: Increase gabapentin 400 TID off label for anxiety ?Give Viibryd 40 mg more time ? ?03/18/2021 appointment with the following noted: ?Got lightheaded and forgetful with Viibryd 40 after awhaile and cut dose in 1/2.  Never saw a difference in axiety with the 2 doses.  Brain fog better with less Viibryd. ?No improvement in anxiety with gabapentin either. ?Still on buspirone for a long time without much change either. ?Plan: Wean Viibryd Dt NR. On 20 mg for 3 weeks ?Switch to sertraline 25 then 50 mg daily. ?Reduce gabapentin to 300 mg TID ? ?05/14/21 appt noted: ?No SE.  Limited effect from sertraline 50 mg daily. ?Still anxious.   ?Wonders about whether should buspirone benefit. ? Effect.  Same dose for 3 years. ?Sleep fine with Ambien. ?Plan: Increase sertraline to 100 mg daily. ?OK continue AmBien 2.5 helps sleep ?Continue gabapentin 300 mg 3 times daily for gabapentin ?Continue buspirone 10 mg 3 times daily ( option wean it, or increase it.) Option wean buspirone once benefit of sertraline. ?Xanax 0.5 mg 1/2 twice daily as needed anxiety. ? ?08/12/2021 appointment with the following noted: ?I might be a little bit better with increase sertraline  to 100 but no SE.  Willing to increase. ?Sleep good with Ambien.  Weaned down over the year.s ?No SE. ?Xanax average 3 times weekly.   ?No physical sx of anxiety usually. ?Asks about propranolol per card for palpitations.  ?Plan: Increase sertraline to 150 mg daily. ?OK continue AmBien 2.5 helps sleep ?Increase buspirone 20 mg 3 times daily to see if it could work better. Option wean buspirone once benefit of  sertraline. ?Xanax 0.5 mg 1/2 twice daily as needed anxiety. ? ?10/22/21 appt noted: ?I think I have noticed a little bit better. ?SE mild sleepiness. ?Changed Sertraline 150 mg HS, buspirone 20 mg TID ?Still has anxiety and wonders if there are more options for her. ?Xanax really helps. ?Good sleeper.  Exercise helps.  Walks in the AM ?Sees anxiety in one of 76 YO GD. ?Plan: Increase sertraline to 200 mg daily. ?OK continue AmBien 2.5 helps sleep ?Continue buspirone 20 mg 3 times daily tfor now ?Marland Kitchen Option wean buspirone once benefit of sertraline. ?Xanax 0.5 mg 1/2 twice daily as needed anxiety. ? ?12/24/2021 appointment with the following noted: ?Seeing some improvement with increase sertraline 200 mg daily. ?Wonders if med interactions might occur. ?Anxiety usually underlying consistent sort of thing.  With stress may take 1/2 of Xanax if feesl pressured with time, responsibilities.   ?Anxiety is clearly better about 60% with sertraline. ?No SE ?Plan: wean buspirone due to an adequate response ?Trial clonidine off label for anxiety 0.1 mg tablets ?1/2 tablet at night for 3 nights, then 1 at night for 4 nights, then 1/2 in AM and 1 at night ? ?03/05/2022 appointment with the following noted: ?Occ a little lightheaded and sleepy and dry mouth.  Has helped with stress.  [place] so up  and down so minor orthostasis.   ?Continues sertraline 200 mg daily ?Xanax 0.5 mg twice weekly prn anxiety. ?No avoidance bc anxiety.  Reduced enjoyment from anxiety mostly social things or demands. ?Anxiety went down from 8 to 6/10.   ?No panic.  ?Sleep really good.  Some weight gain. 7-8 # ?A lot of stress with sister in [place] who had a significant stroke.   ? ?2 sons responded to Wellbutrin and other on Zoloft with Wellbutrin and asks about it.  ?Past Psychiatric History:  Therapy [facility]. ?prescriber Pauline Good & PCP ?Past Psychiatric Medication Trials:  Buspirone 10 TID NR but a little lightheaded ?Lexapro 15  sleepy ?Viibryd 40 NR ?About 5 years ago tried citalopram briefly ?Zoloft 100, paxil, duloxetine, fluoxetine.  ?Clonidine for anxiety ?Gabapentin 300 TID for neuropathy for 2-3 years ?Xanax. ?[employer]ight. ? ?Review of Systems:  ?Review of Systems  ?Cardiovascular:  Negative for chest pain and palpitations.  ?Neurological:  Negative for tremors and weakness.  ? ?Medications: I have reviewed the patient's current medications. ? ?Current Outpatient Medications  ?Medication Sig Dispense Refill  ? anastrozole (ARIMIDEX) 1 MG tablet TAKE 1 TABLET(1 MG) BY MOUTH DAILY 90 tablet 3  ? Ascorbic Acid (VITAMIN C) 1000 MG tablet Take 1,000 mg by mouth daily.    ? b complex vitamins tablet Take 1 tablet by mouth at bedtime.    ? calcium-vitamin D 250-100 MG-UNIT tablet Take 1 tablet by mouth 2 (two) times daily.    ? fluticasone (FLONASE) 50 MCG/ACT nasal spray 1 spray in each nostril    ? gabapentin (NEURONTIN) 300 MG capsule TAKE 1 CAPSULE(300 MG) BY MOUTH THREE TIMES DAILY 180 capsule 0  ? ibandronate (BONIVA) 150 MG  tablet Take 150 mg by mouth every 30 (thirty) days. Take in the morning with a full glass of water, on an empty stomach, and do not take anything else by mouth or lie down for the next 30 min.    ? levocetirizine (XYZAL) 2.5 MG/5ML solution Take 5 mg by mouth every evening.    ? Multiple Vitamin (MULTIVITAMIN WITH MINERALS) TABS tablet Take 1 tablet by mouth at bedtime.    ? rosuvastatin (CRESTOR) 10 MG tablet Take 1 tablet (10 mg total) by mouth daily. (Patient taking differently: Take 10 mg by mouth at bedtime.)    ? ALPRAZolam (XANAX) 0.5 MG tablet TAKE 1/2 TABLET(0.25 MG) BY MOUTH TWICE DAILY AS NEEDED FOR ANXIETY OR SLEEP 60 tablet 2  ? cloNIDine (CATAPRES) 0.1 MG tablet 1/2 in AM and 1 at night 45 tablet 2  ? sertraline (ZOLOFT) 100 MG tablet Take 2 tablets (200 mg total) by mouth daily. 180 tablet 0  ? zolpidem (AMBIEN) 5 MG tablet Take 1 tablet (5 mg total) by mouth at bedtime as needed for sleep. 20 tablet  2  ? ?No current facility-administered medications for this visit.  ? ? ?Medication Side Effects: None ? ?Allergies:  ?Allergies  ?Allergen Reactions  ? Other Other (See Comments)  ?  ? Seasonal allergies - classic symptoms  ? ?

## 2022-03-06 ENCOUNTER — Ambulatory Visit (INDEPENDENT_AMBULATORY_CARE_PROVIDER_SITE_OTHER)
Admission: RE | Admit: 2022-03-06 | Discharge: 2022-03-06 | Disposition: A | Discharge status: Home / Self Care | Payer: Self-pay | Source: Ambulatory Visit | Attending: Student | Admitting: Student

## 2022-03-06 DIAGNOSIS — C50919 Malignant neoplasm of unspecified site of unspecified female breast: Secondary | ICD-10-CM

## 2022-03-06 DIAGNOSIS — I493 Ventricular premature depolarization: Secondary | ICD-10-CM

## 2022-04-09 ENCOUNTER — Ambulatory Visit: Payer: PPO | Admitting: Psychiatry

## 2022-04-22 ENCOUNTER — Other Ambulatory Visit: Payer: Self-pay | Admitting: Psychiatry

## 2022-04-22 DIAGNOSIS — F411 Generalized anxiety disorder: Secondary | ICD-10-CM

## 2022-04-28 DIAGNOSIS — F411 Generalized anxiety disorder: Secondary | ICD-10-CM | POA: Diagnosis not present

## 2022-06-11 ENCOUNTER — Telehealth: Payer: Self-pay | Admitting: Hematology and Oncology

## 2022-06-11 NOTE — Telephone Encounter (Signed)
Per 7/19 phone line pt called to r/s   Appointment r/s per her request

## 2022-06-13 ENCOUNTER — Inpatient Hospital Stay: Payer: PPO | Admitting: Hematology and Oncology

## 2022-06-17 DIAGNOSIS — H31091 Other chorioretinal scars, right eye: Secondary | ICD-10-CM | POA: Diagnosis not present

## 2022-06-17 DIAGNOSIS — H11431 Conjunctival hyperemia, right eye: Secondary | ICD-10-CM | POA: Diagnosis not present

## 2022-06-17 DIAGNOSIS — H35371 Puckering of macula, right eye: Secondary | ICD-10-CM | POA: Diagnosis not present

## 2022-06-17 DIAGNOSIS — H2189 Other specified disorders of iris and ciliary body: Secondary | ICD-10-CM | POA: Diagnosis not present

## 2022-06-19 ENCOUNTER — Encounter: Payer: Self-pay | Admitting: Psychiatry

## 2022-06-19 ENCOUNTER — Ambulatory Visit (INDEPENDENT_AMBULATORY_CARE_PROVIDER_SITE_OTHER): Payer: PPO | Admitting: Psychiatry

## 2022-06-19 DIAGNOSIS — F411 Generalized anxiety disorder: Secondary | ICD-10-CM | POA: Diagnosis not present

## 2022-06-19 DIAGNOSIS — F5105 Insomnia due to other mental disorder: Secondary | ICD-10-CM

## 2022-06-19 MED ORDER — CLONAZEPAM 0.5 MG PO TABS: ORAL_TABLET | ORAL | 1 refills | Status: DC

## 2022-06-19 NOTE — Progress Notes (Signed)
Eveleen Mcnear [id number] 1946/08/04 76 y.o.   Subjective:   Patient ID:  Nazifa Trinka is a 76 y.o. (DOB [birth year]) female.  Chief Complaint:  Chief Complaint  Patient presents with   Follow-up   Generalized anxiety disorder   Medication Problem    HPI Safiya Girdler presents to the office today for follow-up of generalized anxiety disorder. First seen February 21, 2020 Lexapro 5 mg was added to buspirone.  She continued Ambien 5 mg prn.  05/02/2020 appointment the following is noted:  Still anxious without change with Lexapro.  No SE.  Asks about genetic testing.   Takes Xanax only about weekly but wants to continue it.  Ambien 2.5 mg regularluy. Plan: If increase Lexapro does not help then do Genesight testing. Continue Lexapro 10 mg daily Continue buspirone 10 mg 3 times daily Gabapentin 300 TID Xanax 0.5 mg twice daily as needed anxiety.  06/28/2120 appt with the following noted: Doesn't think much difference with Lexapro 10.  Initially sleepy but it resolved.  Chronic worry and can't stop my head.  No SE. Wonders about [employer] Needs Ambien 2.5 mg HS to sleep. Still on others noted and wants better benefit.  Not depressed. Patient reports stable mood and denies depressed or irritable moods.With meds Patient denies difficulty with sleep initiation or maintenance. Denies appetite disturbance.  Patient reports that energy and motivation have been good.  Patient denies any difficulty with concentration.  Patient denies any suicidal ideation. Plan: Rec increase  Lexapro 15 mg daily Continue buspirone 10 mg 3 times daily Xanax 0.5 mg twice daily as needed anxiety. Gensight testing  08/29/20 appt noted: No particular improvements with incrase in Lexapro and is a little more sleepy. Anxiety is about 7/10.  Not depressed. Switch Lexapro to Viibryd 20. Continue buspirone 10 mg 3 times daily Xanax 0.5 mg twice daily as needed anxiety.  10/16/20 appt with following  noted: Sleepiness better off Lexapro 15.  No SE or benefit with Viibryd. No depressed.  Anxiety 5/10.   Sleep is good.   Plan: Increase Viibryd to 30 mg for 2 week then increase to 40 mg daily. Continue buspirone 10 mg 3 times daily Xanax 0.5 mg twice daily as needed anxiety.  12/11/2020 appt with following noted: No sig change anxiety so far.  Rare lightheaded.  Plan: Increase gabapentin 400 TID off label for anxiety Give Viibryd 40 mg more time  03/18/2021 appointment with the following noted: Got lightheaded and forgetful with Viibryd 40 after awhaile and cut dose in 1/2.  Never saw a difference in axiety with the 2 doses.  Brain fog better with less Viibryd. No improvement in anxiety with gabapentin either. Still on buspirone for a long time without much change either. Plan: Wean Viibryd Dt NR. On 20 mg for 3 weeks Switch to sertraline 25 then 50 mg daily. Reduce gabapentin to 300 mg TID  05/14/21 appt noted: No SE.  Limited effect from sertraline 50 mg daily. Still anxious.   Wonders about whether should buspirone benefit. ? Effect.  Same dose for 3 years. Sleep fine with Ambien. Plan: Increase sertraline to 100 mg daily. OK continue AmBien 2.5 helps sleep Continue gabapentin 300 mg 3 times daily for gabapentin Continue buspirone 10 mg 3 times daily ( option wean it, or increase it.) Option wean buspirone once benefit of sertraline. Xanax 0.5 mg 1/2 twice daily as needed anxiety.  08/12/2021 appointment with the following noted: I might be a little bit better with  increase sertraline to 100 but no SE.  Willing to increase. Sleep good with Ambien.  Weaned down over the year.s No SE. Xanax average 3 times weekly.   No physical sx of anxiety usually. Asks about propranolol per card for palpitations.  Plan: Increase sertraline to 150 mg daily. OK continue AmBien 2.5 helps sleep Increase buspirone 20 mg 3 times daily to see if it could work better. Option wean buspirone once  benefit of sertraline. Xanax 0.5 mg 1/2 twice daily as needed anxiety.  10/22/21 appt noted: I think I have noticed a little bit better. SE mild sleepiness. Changed Sertraline 150 mg HS, buspirone 20 mg TID Still has anxiety and wonders if there are more options for her. Xanax really helps. Good sleeper.  Exercise helps.  Walks in the AM Sees anxiety in one of 76 YO GD. Plan: Increase sertraline to 200 mg daily. OK continue AmBien 2.5 helps sleep Continue buspirone 20 mg 3 times daily tfor now . Option wean buspirone once benefit of sertraline. Xanax 0.5 mg 1/2 twice daily as needed anxiety.  12/24/2021 appointment with the following noted: Seeing some improvement with increase sertraline 200 mg daily. Wonders if med interactions might occur. Anxiety usually underlying consistent sort of thing.  With stress may take 1/2 of Xanax if feesl pressured with time, responsibilities.   Anxiety is clearly better about 60% with sertraline. No SE Plan: wean buspirone due to an adequate response Trial clonidine off label for anxiety 0.1 mg tablets 1/2 tablet at night for 3 nights, then 1 at night for 4 nights, then 1/2 in AM and 1 at night  03/05/2022 appointment with the following noted: Occ a little lightheaded and sleepy and dry mouth.  Has helped with stress.  [place] so up  and down so minor orthostasis.   Continues sertraline 200 mg daily Xanax 0.5 mg twice weekly prn anxiety. No avoidance bc anxiety.  Reduced enjoyment from anxiety mostly social things or demands. Anxiety went down from 8 to 6/10.   No panic.  Sleep really good.  Some weight gain. 7-8 # A lot of stress with sister in [place] who had a significant stroke.    06/19/22 appt noted: Anxiety about the same. SE lethargic and sleepy and occ lightheaded esp if stands quickly at church.  Dry mouth. Sleep well at night.  Still walks in the AM. Think that makes me feel the best is Xanax but fears addiction. Not sleepy.     2 sons responded to Wellbutrin and other on Zoloft with Wellbutrin and asks about it.  Past Psychiatric History:  Therapy [facility] Chou. prescriber Pauline Good & PCP Past Psychiatric Medication Trials:  Buspirone 10 TID NR but a little lightheaded Lexapro 15 sleepy Viibryd 40 NR About 5 years ago tried citalopram briefly Zoloft 100, paxil, duloxetine, fluoxetine.  Clonidine for anxiety Gabapentin 300 TID for neuropathy for 2-3 years Xanax. Genesight.  2 drinks nightly  Review of Systems:  Review of Systems  Cardiovascular:  Negative for chest pain and palpitations.  Neurological:  Negative for tremors and weakness.    Medications: I have reviewed the patient's current medications.  Current Outpatient Medications  Medication Sig Dispense Refill   anastrozole (ARIMIDEX) 1 MG tablet TAKE 1 TABLET(1 MG) BY MOUTH DAILY 90 tablet 3   Ascorbic Acid (VITAMIN C) 1000 MG tablet Take 1,000 mg by mouth daily.     b complex vitamins tablet Take 1 tablet by mouth at bedtime.  calcium-vitamin D 250-100 MG-UNIT tablet Take 1 tablet by mouth 2 (two) times daily.     fluticasone (FLONASE) 50 MCG/ACT nasal spray 1 spray in each nostril     gabapentin (NEURONTIN) 300 MG capsule TAKE 1 CAPSULE(300 MG) BY MOUTH THREE TIMES DAILY 180 capsule 0   ibandronate (BONIVA) 150 MG tablet Take 150 mg by mouth every 30 (thirty) days. Take in the morning with a full glass of water, on an empty stomach, and do not take anything else by mouth or lie down for the next 30 min.     levocetirizine (XYZAL) 2.5 MG/5ML solution Take 5 mg by mouth every evening.     Multiple Vitamin (MULTIVITAMIN WITH MINERALS) TABS tablet Take 1 tablet by mouth at bedtime.     rosuvastatin (CRESTOR) 10 MG tablet Take 1 tablet (10 mg total) by mouth daily. (Patient taking differently: Take 10 mg by mouth at bedtime.)     sertraline (ZOLOFT) 100 MG tablet Take 2 tablets (200 mg total) by mouth daily. 180 tablet 0   zolpidem (AMBIEN) 5  MG tablet Take 1 tablet (5 mg total) by mouth at bedtime as needed for sleep. (Patient taking differently: Take 2.5 mg by mouth at bedtime as needed for sleep.) 20 tablet 2   clonazePAM (KLONOPIN) 0.5 MG tablet Take 1/2 -1 tab po BID prn anxiety 90 tablet 1   No current facility-administered medications for this visit.    Medication Side Effects: None  Allergies:  Allergies  Allergen Reactions   Other Other (See Comments)    ? Seasonal allergies - classic symptoms    Past Medical History:  Diagnosis Date   Anxiety    Arthritis    Atrophic vaginitis    Breast cancer ([place])    right (DCIS) s/p lumpectomy 7/15   Cataract    BILATERAL   Cervical dysplasia 1975   DCIS (ductal carcinoma in situ)    RIGHT   Depression    Dysrhythmia    PVCs   Family history of adverse reaction to anesthesia    Pt stated that her mother had difficulty waking up after anesthesia   Family history of breast cancer    Family history of colon cancer    Family history of prostate cancer    Hx of radiation therapy 08/02/14- 08/24/14   right reast 4256 cGy i 16 sessions, no boost   Hypercholesteremia    Insomnia    Osteopenia 11/2011   T score -1.7 FRAX not calculated due to history of bisphosphonates and current Evista   Personal history of radiation therapy 2015   right side   PVC's (premature ventricular contractions)    PMH   Seasonal allergies    Skin cancer 2006   basal cell of face    Family History  Problem Relation Age of Onset   Breast cancer Mother        DIAGNOSED IN HER 80'S   Hyperlipidemia Mother    Osteoporosis Mother    Breast cancer Sister        DIAGNOSED AT AGE 40   Hypertension Maternal Grandmother    Breast cancer Maternal Grandmother        possibly breast cancer; had mastectomy in her 30s-40s, but don't know if it was due to cancer   Heart disease Maternal Grandfather    Stroke Maternal Grandfather    Diabetes Son        TYPE 1 DIABETES   Colon cancer Paternal  Grandfather  Cancer Paternal Grandfather        colon   Heart failure Son    Kidney cancer Paternal Grandmother        bladder/kidney cancer   Prostate cancer Maternal Uncle     Social History   Socioeconomic History   Marital status: Married    Spouse name: Not on file   Number of children: Not on file   Years of education: Not on file   Highest education level: Not on file  Occupational History   Not on file  Tobacco Use   Smoking status: Former    Types: Cigarettes    Quit date: 05/19/1974    Years since quitting: 48.1   Smokeless tobacco: Never   Tobacco comments:    smoked in college  Vaping Use   Vaping Use: Never used  Substance and Sexual Activity   Alcohol use: Yes    Alcohol/week: 10.0 standard drinks of alcohol    Types: 10 Standard drinks or equivalent per week    Comment: daily ETOH cocktail nightly   Drug use: No   Sexual activity: Not Currently    Birth control/protection: Post-menopausal    Comment: G4 P4   Other Topics Concern   Not on file  Social History Narrative   Pt lives in [place] with spouse.  4 children and 8 grandchildren.   House wife   Social Determinants of Radio broadcast assistant Strain: Not on file  Food Insecurity: Not on file  Transportation Needs: Not on file  Physical Activity: Not on file  Stress: Not on file  Social Connections: Not on file  Intimate Partner Violence: Not on file    Past Medical History, Surgical history, Social history, and Family history were reviewed and updated as appropriate.   Please see review of systems for further details on the patient's review from today.   Objective:   Physical Exam:  There were no vitals taken for this visit.  Physical Exam Constitutional:      General: She is not in acute distress. Musculoskeletal:        General: No deformity.  Neurological:     Coordination: Coordination normal.  Psychiatric:        Attention and Perception: Attention normal. She does  not perceive auditory or visual hallucinations.        Mood and Affect: Mood is anxious. Affect is not labile, blunt, angry, tearful or inappropriate.        Speech: Speech normal.        Behavior: Behavior normal.        Thought Content: Thought content normal. Thought content is not delusional. Thought content does not include homicidal or suicidal ideation.        Cognition and Memory: Cognition normal.        Judgment: Judgment normal.     Comments: Anxity better not gone     Lab Review:     Component Value Date/Time   NA 140 10/01/2018 0628   NA 140 06/30/2017 0904   K 4.1 10/01/2018 0628   CL 110 10/01/2018 0628   CO2 25 10/01/2018 0628   GLUCOSE 90 10/01/2018 0628   BUN 17 10/01/2018 0628   BUN 23 06/30/2017 0904   CREATININE 0.94 10/01/2018 0628   CALCIUM 8.8 (L) 10/01/2018 0628   PROT 6.1 (L) 10/01/2018 0628   ALBUMIN 3.6 10/01/2018 0628   AST 23 10/01/2018 0628   ALT 18 10/01/2018 0628   ALKPHOS 31 (L) 10/01/2018  0628   BILITOT 0.6 10/01/2018 0628   GFRNONAA 59 (L) 10/01/2018 0628   GFRAA >60 10/01/2018 0628       Component Value Date/Time   WBC 5.1 09/26/2020 0942   RBC 4.10 09/26/2020 0942   HGB 13.7 09/26/2020 0942   HCT 42.4 09/26/2020 0942   PLT 231 09/26/2020 0942   MCV 103.4 (H) 09/26/2020 0942   MCH 33.4 09/26/2020 0942   MCHC 32.3 09/26/2020 0942   RDW 12.9 09/26/2020 0942   LYMPHSABS 1.6 06/08/2014 1330   MONOABS 0.6 06/08/2014 1330   EOSABS 0.2 06/08/2014 1330   BASOSABS 0.0 06/08/2014 1330    No results found for: "POCLITH", "LITHIUM"   No results found for: "PHENYTOIN", "PHENOBARB", "VALPROATE", "CBMZ"   .res Assessment: Plan:    Zeta was seen today for follow-up, generalized anxiety disorder and medication problem.  Diagnoses and all orders for this visit:  Generalized anxiety disorder -     Discontinue: clonazePAM (KLONOPIN) 0.5 MG tablet; Take 1/2 -1 tab po BID prn anxiety -     clonazePAM (KLONOPIN) 0.5 MG tablet; Take 1/2  -1 tab po BID prn anxiety  Insomnia due to mental condition    Greater than 50% of 30 min face to face time with patient was spent on counseling and coordination of care. We Discussed diagnosis and treatment plan.  Again.  Answered questions about the different mechanisms of different medications and their mechanisms specifically for anxiety including the sertraline, clonidine and Xanax..  Discussed the side effects at length.  Overall she has had about 60% reduction in anxiety with sertraline 200 mg daily but like to have further improvement if possible.  Genesight testing results disc at length again.  Suggested Viibryd but never helped after several weeks.   Did genetic testing 08/29/20 per her request..  Have reviewed the results with her in some detail.  We covered each of the normal and abnormal for pharmodynamic genes and pharmacokinetic genes as well as which meds are metabolized by which genes and how that is applied.  It was discussed that this is not the complete in total genetic information that is relevant to the use of psychiatric meds but it contains the currently available genetic information.  Option propranolol  continue sertraline to 200 mg daily. OK continue AmBien 2.5 helps sleep  benefit clonidine off label for anxiety 0.1 mg tablets 1/2 in AM and 1 at night in the past but SE Wean it DT SE and replace with clonazepam Reduce clonidine to 1 at night for 1 week then 1/2 at night for 1 week then stop it. Recommend check blood pressure at home to be sure it is okay.  She has a blood pressure cuff. Answered questions about benign PVCs which have not required treatment according to her cardiologist.  She will not combine beta-blockers or other hypertensive medications with clonidine without consent by her cardiologist.  Option Xanax XR Answered questions about Xanax  and BZ use. Worries over dependence and disc use up to 4 days/week. We discussed the short-term risks  associated with benzodiazepines including sedation and increased fall risk among others.  Discussed long-term side effect risk including dependence, potential withdrawal symptoms, and the potential eventual dose-related risk of dementia.  But recent studies from 2020 dispute this association between benzodiazepines and dementia risk. Newer studies in 2020 do not support an association with dementia. She does exercise including balance issues.    Start clonazepam 0.5 mg tablet 1 once or twice daily  Continue gabapentin back to 300 TID which she's taking for neuropathy. No sleeepiness it. Discussed side effects including dizziness in particular and balance trouble as well as fatigue and potential for weight gain.  FU 8 weeks.  Lynder Parents, MD, DFAPA   Please see After Visit Summary for patient specific instructions.  Future Appointments  Date Time Provider [place]  06/23/2022 10:30 AM Nicholas Lose, MD [facility] None    No orders of the defined types were placed in this encounter.    -------------------------------

## 2022-06-19 NOTE — Progress Notes (Signed)
Patient Care Team: Ginger Organ., MD as PCP - General (Internal Medicine) Thompson Grayer, MD as PCP - Cardiology (Cardiology) Thompson Grayer, MD as PCP - Electrophysiology (Cardiology) Mauro Kaufmann, RN as Oncology Nurse Navigator Rockwell Germany, RN as Oncology Nurse Navigator  DIAGNOSIS: No diagnosis found.  SUMMARY OF ONCOLOGIC HISTORY: Oncology History  Malignant neoplasm of lower-inner quadrant of right female breast ([place])  02/03/2014 Breast MRI   7 X 6X 5 mm right breast abnormality as 2 immediately adjacent 3 mm foci of enhancement   05/19/2014 Initial Diagnosis   Malignant neoplasm of lower-inner quadrant of female breast DCIS   06/13/2014 Surgery   R Lumpectomy DCIS 2 mm size Grade 1 Er 70%, PR 80%, Margins Neg   07/25/2014 - 08/25/2014 Radiation Therapy   Adjuvant radiation therapy   10/18/2014 Procedure   genetic testing was normal no mutations identified   05/07/2017 Surgery   Left lumpectomy: Complex sclerosing lesion with usual ductal hyperplasia, focal atypical lobular hyperplasia, no invasive cancer identified   08/10/2017 - 06/02/2019 Anti-estrogen oral therapy   Tamoxifen 20 mg daily x5 years   08/11/2018 Breast MRI   Persistent non-mass enhancement within the left LOQ at anterior depth, at the site of patient's earlier MRI-guided biopsy revealing complex sclerosing lesion, atypical lobular hyperplasia and pseudoangiomatous stromal hyperplasia. This non-mass enhancement is not significantly changed in extent compared to the previous MRI of 02/05/2017 (pre biopsy). No evidence of malignancy within right breast.      09/04/2020 Relapse/Recurrence   Right breast biopsy medial: IDC with DCIS, grade 2, ER greater than 95%, PR 30%, Ki-67 20%, insufficient tumor for HER-2 testing   10/12/2020 Surgery   Right lumpectomy Donne Hazel): intermediate grade DCIS, clear margins, 1 right axillary lymph node negative for carcinoma.   10/24/2020 -  Anti-estrogen oral therapy    Anastrozole     CHIEF COMPLIANT: Follow-up of right breast cancer on anastrozole    INTERVAL HISTORY: Linda [name] is a a 76 y.o. with above-mentioned history of recurrent right breast cancer. She presents to the clinic today for a follow-up.   ALLERGIES:  is allergic to other.  MEDICATIONS:  Current Outpatient Medications  Medication Sig Dispense Refill   anastrozole (ARIMIDEX) 1 MG tablet TAKE 1 TABLET(1 MG) BY MOUTH DAILY 90 tablet 3   Ascorbic Acid (VITAMIN C) 1000 MG tablet Take 1,000 mg by mouth daily.     b complex vitamins tablet Take 1 tablet by mouth at bedtime.     calcium-vitamin D 250-100 MG-UNIT tablet Take 1 tablet by mouth 2 (two) times daily.     clonazePAM (KLONOPIN) 0.5 MG tablet Take 1/2 -1 tab po BID prn anxiety 90 tablet 1   fluticasone (FLONASE) 50 MCG/ACT nasal spray 1 spray in each nostril     gabapentin (NEURONTIN) 300 MG capsule TAKE 1 CAPSULE(300 MG) BY MOUTH THREE TIMES DAILY 180 capsule 0   ibandronate (BONIVA) 150 MG tablet Take 150 mg by mouth every 30 (thirty) days. Take in the morning with a full glass of water, on an empty stomach, and do not take anything else by mouth or lie down for the next 30 min.     levocetirizine (XYZAL) 2.5 MG/5ML solution Take 5 mg by mouth every evening.     Multiple Vitamin (MULTIVITAMIN WITH MINERALS) TABS tablet Take 1 tablet by mouth at bedtime.     rosuvastatin (CRESTOR) 10 MG tablet Take 1 tablet (10 mg total) by mouth daily. (Patient taking  differently: Take 10 mg by mouth at bedtime.)     sertraline (ZOLOFT) 100 MG tablet Take 2 tablets (200 mg total) by mouth daily. 180 tablet 0   zolpidem (AMBIEN) 5 MG tablet Take 1 tablet (5 mg total) by mouth at bedtime as needed for sleep. (Patient taking differently: Take 2.5 mg by mouth at bedtime as needed for sleep.) 20 tablet 2   No current facility-administered medications for this visit.    PHYSICAL EXAMINATION: ECOG PERFORMANCE STATUS: {CHL ONC ECOG  PS:[phone]}  There were no vitals filed for this visit. There were no vitals filed for this visit.  BREAST:*** No palpable masses or nodules in either right or left breasts. No palpable axillary supraclavicular or infraclavicular adenopathy no breast tenderness or nipple discharge. (exam performed in the presence of a chaperone)  LABORATORY DATA:  I have reviewed the data as listed    Latest Ref Rng & Units 10/01/2018    6:28 AM 08/11/2018    3:39 PM 06/30/2017    9:04 AM  CMP  Glucose 70 - 99 mg/dL 90   92   BUN 8 - 23 mg/dL 17   23   Creatinine 0.44 - 1.00 mg/dL 0.94  1.10  0.87   Sodium 135 - 145 mmol/L 140   140   Potassium 3.5 - 5.1 mmol/L 4.1   4.7   Chloride 98 - 111 mmol/L 110   103   CO2 22 - 32 mmol/L 25   24   Calcium 8.9 - 10.3 mg/dL 8.8   9.6   Total Protein 6.5 - 8.1 g/dL 6.1     Total Bilirubin 0.3 - 1.2 mg/dL 0.6     Alkaline Phos 38 - 126 U/L 31     AST 15 - 41 U/L 23     ALT 0 - 44 U/L 18       Lab Results  Component Value Date   WBC 5.1 09/26/2020   HGB 13.7 09/26/2020   HCT 42.4 09/26/2020   MCV 103.4 (H) 09/26/2020   PLT 231 09/26/2020   NEUTROABS 4.1 06/08/2014    ASSESSMENT & PLAN:  No problem-specific Assessment & Plan notes found for this encounter.    No orders of the defined types were placed in this encounter.  The patient has a good understanding of the overall plan. she agrees with it. she will call with any problems that may develop before the next visit here. Total time spent: 30 mins including face to face time and time spent for planning, charting and co-ordination of care   Suzzette Righter, [place] 06/19/22    I Gardiner Coins am scribing for Dr. Lindi Adie  ***

## 2022-06-19 NOTE — Patient Instructions (Addendum)
Reduce clonidine to 1 at night for 1 week then 1/2 at night for 1 week then stop it. Start clonazepam 0.5 mg tablet 1 once or twice daily

## 2022-06-23 ENCOUNTER — Inpatient Hospital Stay: Payer: PPO | Attending: Hematology and Oncology | Admitting: Hematology and Oncology

## 2022-06-23 ENCOUNTER — Other Ambulatory Visit: Payer: Self-pay

## 2022-06-23 VITALS — BP 110/68 | HR 73 | Temp 97.9°F | Resp 16 | Ht 67.0 in | Wt 151.4 lb

## 2022-06-23 DIAGNOSIS — Z170 Estrogen receptor positive status [ER+]: Secondary | ICD-10-CM | POA: Insufficient documentation

## 2022-06-23 DIAGNOSIS — Z79899 Other long term (current) drug therapy: Secondary | ICD-10-CM | POA: Insufficient documentation

## 2022-06-23 DIAGNOSIS — C50211 Malignant neoplasm of upper-inner quadrant of right female breast: Secondary | ICD-10-CM | POA: Insufficient documentation

## 2022-06-23 DIAGNOSIS — Z923 Personal history of irradiation: Secondary | ICD-10-CM | POA: Insufficient documentation

## 2022-06-23 DIAGNOSIS — M8580 Other specified disorders of bone density and structure, unspecified site: Secondary | ICD-10-CM | POA: Insufficient documentation

## 2022-06-23 DIAGNOSIS — C50311 Malignant neoplasm of lower-inner quadrant of right female breast: Secondary | ICD-10-CM

## 2022-06-23 MED ORDER — ANASTROZOLE 1 MG PO TABS: ORAL_TABLET | ORAL | 3 refills | Status: DC

## 2022-06-23 NOTE — Assessment & Plan Note (Addendum)
01/2014: Right breast DCIS ER/PR positive 7 mm focus on MRI  06/13/2014 Rt lumpectomy  2 mm focus of DCIS negative margins, ER 70%, PR 80% positive status post radiation therapy; 05/07/2017:Left lumpectomy: Complex sclerosing lesion with usual ductal hyperplasia, focal atypical lobular hyperplasia, no invasive cancer identified Adjuvant radiation therapy  Priortreatment: Tamoxifen 20 mg daily2015-2020  Osteopenia:Was previously on Boniva. Not taking it any further.Calcium and vitamin D Bone density 10 2015: T score -1.8. Bone Density July 2020: T score -2  Biopsy 09/04/2020: Right breast medial: IDC with DCIS grade 2, ER greater than 95%, PR 30%, Ki-67 20%HER-2 not done because of insufficient tissue;right breast LOQ: DCIS  10/12/2020:Right lumpectomy Donne Hazel): intermediate grade DCIS, clear margins, 1 right axillary lymph node negative for carcinomatreatment .  AnastrozoleToxicities:Tolerating it well without any problems or concerns. Denies any hot flashes or myalgias.  Breast Cancer Surveillance:  Mammograms  02/27/2022: Benign breast density category B breast MRIs 08/13/2021: Persistent stable non-mass enhancement left breast previously biopsied CSL and ALH. (Not resected) Bone density 07/02/2021: T score -1.8: Osteopenia colon cancer calcium vitamin D and weightbearing exercises   Return to clinic in 1 year for follow-up.

## 2022-06-24 ENCOUNTER — Telehealth: Payer: Self-pay | Admitting: Hematology and Oncology

## 2022-06-24 NOTE — Telephone Encounter (Signed)
Scheduled appointment per 7/31 los. Patient is aware.

## 2022-06-25 ENCOUNTER — Telehealth: Payer: Self-pay | Admitting: Psychiatry

## 2022-06-25 MED ORDER — CLONIDINE HCL 0.1 MG PO TABS: 0.1000 mg | ORAL_TABLET | Freq: Every day | ORAL | 0 refills | Status: DC

## 2022-06-25 NOTE — Telephone Encounter (Signed)
Pt left message weaning off Clonadine and need 3 more to complete the cycle. [facility] [address]. Contact pt # [phone]

## 2022-06-25 NOTE — Telephone Encounter (Signed)
Sent Rx 

## 2022-07-01 DIAGNOSIS — F411 Generalized anxiety disorder: Secondary | ICD-10-CM | POA: Diagnosis not present

## 2022-07-02 ENCOUNTER — Other Ambulatory Visit: Payer: Self-pay | Admitting: Psychiatry

## 2022-07-02 DIAGNOSIS — F411 Generalized anxiety disorder: Secondary | ICD-10-CM

## 2022-07-28 ENCOUNTER — Other Ambulatory Visit: Payer: Self-pay | Admitting: Psychiatry

## 2022-07-28 DIAGNOSIS — F411 Generalized anxiety disorder: Secondary | ICD-10-CM

## 2022-07-30 DIAGNOSIS — E785 Hyperlipidemia, unspecified: Secondary | ICD-10-CM | POA: Diagnosis not present

## 2022-07-30 DIAGNOSIS — R7301 Impaired fasting glucose: Secondary | ICD-10-CM | POA: Diagnosis not present

## 2022-07-30 DIAGNOSIS — M8580 Other specified disorders of bone density and structure, unspecified site: Secondary | ICD-10-CM | POA: Diagnosis not present

## 2022-07-30 DIAGNOSIS — F411 Generalized anxiety disorder: Secondary | ICD-10-CM | POA: Diagnosis not present

## 2022-07-30 DIAGNOSIS — R7989 Other specified abnormal findings of blood chemistry: Secondary | ICD-10-CM | POA: Diagnosis not present

## 2022-08-04 ENCOUNTER — Telehealth: Payer: Self-pay | Admitting: Psychiatry

## 2022-08-04 NOTE — Telephone Encounter (Signed)
Pt Lvm @ 10:11a.  She said she feels like the Klonopin is affecting her balance.  She would like  a call back.  Next appt 10/16

## 2022-08-04 NOTE — Telephone Encounter (Signed)
LVM to rtc 

## 2022-08-04 NOTE — Telephone Encounter (Signed)
I couldn't reach her but I will go ahead and send you this message.She reports klonopin is affecting her balance

## 2022-08-05 DIAGNOSIS — N39490 Overflow incontinence: Secondary | ICD-10-CM | POA: Diagnosis not present

## 2022-08-05 DIAGNOSIS — M8580 Other specified disorders of bone density and structure, unspecified site: Secondary | ICD-10-CM | POA: Diagnosis not present

## 2022-08-05 DIAGNOSIS — D692 Other nonthrombocytopenic purpura: Secondary | ICD-10-CM | POA: Diagnosis not present

## 2022-08-05 DIAGNOSIS — Z1339 Encounter for screening examination for other mental health and behavioral disorders: Secondary | ICD-10-CM | POA: Diagnosis not present

## 2022-08-05 DIAGNOSIS — E785 Hyperlipidemia, unspecified: Secondary | ICD-10-CM | POA: Diagnosis not present

## 2022-08-05 DIAGNOSIS — G629 Polyneuropathy, unspecified: Secondary | ICD-10-CM | POA: Diagnosis not present

## 2022-08-05 DIAGNOSIS — G4700 Insomnia, unspecified: Secondary | ICD-10-CM | POA: Diagnosis not present

## 2022-08-05 DIAGNOSIS — F411 Generalized anxiety disorder: Secondary | ICD-10-CM | POA: Diagnosis not present

## 2022-08-05 DIAGNOSIS — Z1331 Encounter for screening for depression: Secondary | ICD-10-CM | POA: Diagnosis not present

## 2022-08-05 DIAGNOSIS — R82998 Other abnormal findings in urine: Secondary | ICD-10-CM | POA: Diagnosis not present

## 2022-08-05 DIAGNOSIS — J309 Allergic rhinitis, unspecified: Secondary | ICD-10-CM | POA: Diagnosis not present

## 2022-08-05 DIAGNOSIS — F1320 Sedative, hypnotic or anxiolytic dependence, uncomplicated: Secondary | ICD-10-CM | POA: Diagnosis not present

## 2022-08-05 DIAGNOSIS — C801 Malignant (primary) neoplasm, unspecified: Secondary | ICD-10-CM | POA: Diagnosis not present

## 2022-08-05 DIAGNOSIS — Z0000 Encounter for general adult medical examination without abnormal findings: Secondary | ICD-10-CM | POA: Diagnosis not present

## 2022-08-05 DIAGNOSIS — R7301 Impaired fasting glucose: Secondary | ICD-10-CM | POA: Diagnosis not present

## 2022-08-05 NOTE — Telephone Encounter (Signed)
Pt stated she takes the 0.5 mg 1 tab in the morning and 1/2 at night.She said she feels light headed but not dizzy.She said sometimes her balance is affected when walking but not always and described that she feels "fuzzy".

## 2022-08-05 NOTE — Telephone Encounter (Signed)
As noted previously, clonazepam RX was written for a range in dose.  Pls find out how much she is actually taking.

## 2022-08-06 NOTE — Telephone Encounter (Signed)
Pt informed

## 2022-08-06 NOTE — Telephone Encounter (Signed)
Reduce the clonazepam to 1/2 of 0.5 mg twice daily.  if not better by Monday let us know.

## 2022-08-17 ENCOUNTER — Other Ambulatory Visit: Payer: Self-pay | Admitting: Psychiatry

## 2022-08-19 ENCOUNTER — Telehealth: Payer: Self-pay | Admitting: Psychiatry

## 2022-08-19 DIAGNOSIS — F411 Generalized anxiety disorder: Secondary | ICD-10-CM | POA: Diagnosis not present

## 2022-08-19 NOTE — Telephone Encounter (Signed)
Pt LVM requesting RF Clonazepam. Short 2 weeks from apt 10/16. Contact # [phone]

## 2022-08-19 NOTE — Telephone Encounter (Signed)
Called patient and she said she couldn't get from the pharmacy unless we called. I show she has a RF available. I called pharmacy and medication is backordered. They do show her having a RF available but said medication has been on backorder about a month and they don't know when they will get any in. Called the patient and informed her. Told her she could call other pharmacies to try to find in stock and let us know and we could send in an Rx.

## 2022-08-28 ENCOUNTER — Other Ambulatory Visit: Payer: Self-pay | Admitting: Psychiatry

## 2022-08-28 DIAGNOSIS — F411 Generalized anxiety disorder: Secondary | ICD-10-CM

## 2022-08-28 DIAGNOSIS — H903 Sensorineural hearing loss, bilateral: Secondary | ICD-10-CM | POA: Diagnosis not present

## 2022-09-01 ENCOUNTER — Ambulatory Visit
Admission: RE | Admit: 2022-09-01 | Discharge: 2022-09-01 | Disposition: A | Discharge status: Home / Self Care | Payer: PPO | Source: Ambulatory Visit | Attending: Hematology and Oncology | Admitting: Hematology and Oncology

## 2022-09-01 DIAGNOSIS — Z1239 Encounter for other screening for malignant neoplasm of breast: Secondary | ICD-10-CM | POA: Diagnosis not present

## 2022-09-01 DIAGNOSIS — Z853 Personal history of malignant neoplasm of breast: Secondary | ICD-10-CM | POA: Diagnosis not present

## 2022-09-01 DIAGNOSIS — Z170 Estrogen receptor positive status [ER+]: Secondary | ICD-10-CM

## 2022-09-01 MED ORDER — GADOBUTROL 1 MMOL/ML IV SOLN
7.0000 mL | Freq: Once | INTRAVENOUS | Status: AC | PRN
  Administered 2022-09-01: 7 mL via INTRAVENOUS

## 2022-09-05 ENCOUNTER — Ambulatory Visit: Payer: PPO | Attending: Student | Admitting: Student

## 2022-09-05 ENCOUNTER — Encounter: Payer: Self-pay | Admitting: Student

## 2022-09-05 VITALS — BP 108/70 | HR 67 | Ht 67.0 in | Wt 154.0 lb

## 2022-09-05 DIAGNOSIS — I493 Ventricular premature depolarization: Secondary | ICD-10-CM | POA: Diagnosis not present

## 2022-09-05 DIAGNOSIS — C50919 Malignant neoplasm of unspecified site of unspecified female breast: Secondary | ICD-10-CM

## 2022-09-05 NOTE — Progress Notes (Signed)
PCP:  Ginger Organ., MD Primary Cardiologist: Thompson Grayer, MD Electrophysiologist: Thompson Grayer, MD -> Dr. [name] is a 76 y.o. female seen today for Thompson Grayer, MD for routine electrophysiology followup. Since last being seen in our clinic the patient reports doing very well. She did have a cluster of palpitations in January, that resolved and did not recur.  she denies chest pain, palpitations, dyspnea, PND, orthopnea, nausea, vomiting, dizziness, syncope, edema, weight gain, or early satiety.   Past Medical History:  Diagnosis Date   Anxiety    Arthritis    Atrophic vaginitis    Breast cancer ([place])    right (DCIS) s/p lumpectomy 7/15   Cataract    BILATERAL   Cervical dysplasia 1975   DCIS (ductal carcinoma in situ)    RIGHT   Depression    Dysrhythmia    PVCs   Family history of adverse reaction to anesthesia    Pt stated that her mother had difficulty waking up after anesthesia   Family history of breast cancer    Family history of colon cancer    Family history of prostate cancer    Hx of radiation therapy 08/02/14- 08/24/14   right reast 4256 cGy i 16 sessions, no boost   Hypercholesteremia    Insomnia    Osteopenia 11/2011   T score -1.7 FRAX not calculated due to history of bisphosphonates and current Evista   Personal history of radiation therapy 2015   right side   PVC's (premature ventricular contractions)    PMH   Seasonal allergies    Skin cancer 2006   basal cell of face   Past Surgical History:  Procedure Laterality Date   APPENDECTOMY  1955   BREAST BIOPSY  2010   rt bx   BREAST BIOPSY  2009   rt   BREAST EXCISIONAL BIOPSY Left 2018   [place]   BREAST LUMPECTOMY  1983   BENIGN-rt   BREAST LUMPECTOMY Right 2015   dcis   BREAST LUMPECTOMY WITH RADIOACTIVE SEED AND SENTINEL LYMPH NODE BIOPSY Right 10/12/2020   Procedure: RIGHT BREAST LUMPECTOMY X 2 WITH RADIOACTIVE SEED AND SENTINEL LYMPH NODE  BIOPSY;  Surgeon: Rolm Bookbinder, MD;  Location: [facility];  Service: General;  Laterality: Right;   BREAST LUMPECTOMY WITH RADIOACTIVE SEED LOCALIZATION Right 06/13/2014   Procedure: RIGHT BREAST  RADIOACTIVE SEED GUIDED LUMPECTOMY;  Surgeon: Rolm Bookbinder, MD;  Location: [facility];  Service: General;  Laterality: Right;for DCIS   CATARACT EXTRACTION     Both eyes   cataract surgery  2005   COLONOSCOPY     COLPOSCOPY     EXCISION OF BASAL CELL CA FROM [facility]  2006   FACELIFT  2014   [place]  2013   Arthroscopic-rt   RADIOACTIVE SEED GUIDED EXCISIONAL BREAST BIOPSY Left 05/07/2017   Procedure: LEFT RADIOACTIVE SEED GUIDED EXCISIONAL BREAST BIOPSY;  Surgeon: Rolm Bookbinder, MD;  Location: [facility];  Service: General;  Laterality: Left;   REPOSITION OF LENS Right 10/01/2018   Procedure: REPOSITION OF LENS RIGHT EYE;  Surgeon: Jalene Mullet, MD;  Location: [facility];  Service: Ophthalmology;  Laterality: Right;    Current Outpatient Medications  Medication Sig Dispense Refill   anastrozole (ARIMIDEX) 1 MG tablet TAKE 1 TABLET(1 MG) BY MOUTH DAILY 90 tablet 3   Ascorbic Acid (  VITAMIN C) 1000 MG tablet Take 1,000 mg by mouth daily.     b complex vitamins tablet Take 1 tablet by mouth at bedtime.     calcium-vitamin D 250-100 MG-UNIT tablet Take 1 tablet by mouth 2 (two) times daily.     clonazePAM (KLONOPIN) 0.5 MG tablet Take 1/2 -1 tab po BID prn anxiety 90 tablet 1   cloNIDine (CATAPRES) 0.1 MG tablet Take 1 tablet (0.1 mg total) by mouth daily. 3 tablet 0   fluticasone (FLONASE) 50 MCG/ACT nasal spray 1 spray in each nostril     gabapentin (NEURONTIN) 300 MG capsule TAKE 1 CAPSULE(300 MG) BY MOUTH THREE TIMES DAILY 90 capsule 0   ibandronate (BONIVA) 150 MG tablet Take 150 mg by mouth every 30 (thirty) days. Take in the morning with a full glass of water, on an empty  stomach, and do not take anything else by mouth or lie down for the next 30 min.     levocetirizine (XYZAL) 2.5 MG/5ML solution Take 5 mg by mouth every evening.     Multiple Vitamin (MULTIVITAMIN WITH MINERALS) TABS tablet Take 1 tablet by mouth at bedtime.     rosuvastatin (CRESTOR) 10 MG tablet Take 1 tablet (10 mg total) by mouth daily. (Patient taking differently: Take 10 mg by mouth at bedtime.)     sertraline (ZOLOFT) 100 MG tablet TAKE 2 TABLETS(200 MG) BY MOUTH DAILY 180 tablet 0   zolpidem (AMBIEN) 5 MG tablet Take 1 tablet (5 mg total) by mouth at bedtime as needed for sleep. (Patient taking differently: Take 2.5 mg by mouth at bedtime as needed for sleep.) 20 tablet 2   No current facility-administered medications for this visit.    Allergies  Allergen Reactions   Other Other (See Comments)    ? Seasonal allergies - classic symptoms    Social History   Socioeconomic History   Marital status: Married    Spouse name: Not on file   Number of children: Not on file   Years of education: Not on file   Highest education level: Not on file  Occupational History   Not on file  Tobacco Use   Smoking status: Former    Types: Cigarettes    Quit date: 05/19/1974    Years since quitting: 48.3   Smokeless tobacco: Never   Tobacco comments:    smoked in college  Vaping Use   Vaping Use: Never used  Substance and Sexual Activity   Alcohol use: Yes    Alcohol/week: 10.0 standard drinks of alcohol    Types: 10 Standard drinks or equivalent per week    Comment: daily ETOH cocktail nightly   Drug use: No   Sexual activity: Not Currently    Birth control/protection: Post-menopausal    Comment: G4 P4   Other Topics Concern   Not on file  Social History Narrative   Pt lives in [place] with spouse.  4 children and 8 grandchildren.   House wife   Social Determinants of Radio broadcast assistant Strain: Not on file  Food Insecurity: Not on file  Transportation Needs: Not  on file  Physical Activity: Not on file  Stress: Not on file  Social Connections: Not on file  Intimate Partner Violence: Not on file     Review of Systems: All other systems reviewed and are otherwise negative except as noted above.  Physical Exam: Vitals:   09/05/22 1139  BP: 108/70  Pulse: 67  Weight: 154 lb (69.9  kg)  Height: '5\' 7"'$  (1.702 m)    GEN- The patient is well appearing, alert and oriented x 3 today.   HEENT: normocephalic, atraumatic; sclera clear, conjunctiva pink; hearing intact; oropharynx clear; neck supple, no JVP Lymph- no cervical lymphadenopathy Lungs- Clear to ausculation bilaterally, normal work of breathing.  No wheezes, rales, rhonchi Heart- Regular rate and rhythm, no murmurs, rubs or gallops, PMI not laterally displaced GI- soft, non-tender, non-distended, bowel sounds present, no hepatosplenomegaly Extremities- No peripheral edema. no clubbing or cyanosis; DP/PT/radial pulses 2+ bilaterally MS- no significant deformity or atrophy Skin- warm and dry, no rash or lesion Psych- euthymic mood, full affect Neuro- strength and sensation are intact  EKG is ordered. Personal review of EKG from today shows NSR at 67 bpm  Additional studies reviewed include: Previous EP office notes.   Assessment and Plan:  1. PVCs Re-assurance continued Encouraged hydration and discussed exertion in the heat and poor fluid intake as a possible trigger Previously labwork stable.  EF preserved on Echo 2021.  Prefers to avoid medications. Would start low dose BB if wants to pursue meds   2. Breast Cancer S/p lumpectomy and radiation Did not require chemotherapy No change   3. Health Screening Calcium score 0 03/06/2022  Follow up with Dr. Curt Bears in 12 months to establish from Dr. Thomasene Mohair, PA-C  09/05/22 11:45 AM

## 2022-09-05 NOTE — Patient Instructions (Signed)
Medication Instructions:  Your physician recommends that you continue on your current medications as directed. Please refer to the Current Medication list given to you today.  *If you need a refill on your cardiac medications before your next appointment, please call your pharmacy*   Lab Work: None If you have labs (blood work) drawn today and your tests are completely normal, you will receive your results only by: MyChart Message (if you have MyChart) OR A paper copy in the mail If you have any lab test that is abnormal or we need to change your treatment, we will call you to review the results.   Follow-Up: At  [facility], you and your health needs are our priority.  As part of our continuing mission to provide you with exceptional heart care, we have created designated Provider Care Teams.  These Care Teams include your primary Cardiologist (physician) and Advanced Practice Providers (APPs -  Physician Assistants and Nurse Practitioners) who all work together to provide you with the care you need, when you need it.  Your next appointment:   1 year(s)  The format for your next appointment:   In Person  Provider:   Will Camnitz, MD     Important Information About Sugar       

## 2022-09-08 ENCOUNTER — Ambulatory Visit: Payer: PPO | Admitting: Psychiatry

## 2022-09-08 ENCOUNTER — Encounter: Payer: Self-pay | Admitting: Psychiatry

## 2022-09-08 DIAGNOSIS — F411 Generalized anxiety disorder: Secondary | ICD-10-CM

## 2022-09-08 DIAGNOSIS — F5105 Insomnia due to other mental disorder: Secondary | ICD-10-CM | POA: Diagnosis not present

## 2022-09-08 MED ORDER — LORAZEPAM 0.5 MG PO TABS: 0.5000 mg | ORAL_TABLET | Freq: Three times a day (TID) | ORAL | 0 refills | Status: DC | PRN

## 2022-09-08 NOTE — Progress Notes (Signed)
Linda [name] [id number] 09-02-1946 76 y.o.   Subjective:   Patient ID:  Linda [name] is a 76 y.o. (DOB 1946-03-24) female.  Chief Complaint:  Chief Complaint  Patient presents with   Follow-up   Anxiety    HPI Linda [name] presents to the office today for follow-up of generalized anxiety disorder. First seen February 21, 2020 Lexapro 5 mg was added to buspirone.  She continued Ambien 5 mg prn.  05/02/2020 appointment the following is noted:  Still anxious without change with Lexapro.  No SE.  Asks about genetic testing.   Takes Xanax only about weekly but wants to continue it.  Ambien 2.5 mg regularluy. Plan: If increase Lexapro does not help then do Genesight testing. Continue Lexapro 10 mg daily Continue buspirone 10 mg 3 times daily Gabapentin 300 TID Xanax 0.5 mg twice daily as needed anxiety.  06/28/2120 appt with the following noted: Doesn't think much difference with Lexapro 10.  Initially sleepy but it resolved.  Chronic worry and can't stop my head.  No SE. Wonders about [employer] Needs Ambien 2.5 mg HS to sleep. Still on others noted and wants better benefit.  Not depressed. Patient reports stable mood and denies depressed or irritable moods.With meds Patient denies difficulty with sleep initiation or maintenance. Denies appetite disturbance.  Patient reports that energy and motivation have been [name].  Patient denies any difficulty with concentration.  Patient denies any suicidal ideation. Plan: Rec increase  Lexapro 15 mg daily Continue buspirone 10 mg 3 times daily Xanax 0.5 mg twice daily as needed anxiety. Gensight testing  08/29/20 appt noted: No particular improvements with incrase in Lexapro and is a little more sleepy. Anxiety is about 7/10.  Not depressed. Switch Lexapro to Viibryd 20. Continue buspirone 10 mg 3 times daily Xanax 0.5 mg twice daily as needed anxiety.  10/16/20 appt with following noted: Sleepiness better off Lexapro 15.   No SE or benefit with Viibryd. No depressed.  Anxiety 5/10.   Sleep is [name].   Plan: Increase Viibryd to 30 mg for 2 week then increase to 40 mg daily. Continue buspirone 10 mg 3 times daily Xanax 0.5 mg twice daily as needed anxiety.  12/11/2020 appt with following noted: No sig change anxiety so far.  Rare lightheaded.  Plan: Increase gabapentin 400 TID off label for anxiety Give Viibryd 40 mg more time  03/18/2021 appointment with the following noted: Got lightheaded and forgetful with Viibryd 40 after awhaile and cut dose in 1/2.  Never saw a difference in axiety with the 2 doses.  Brain fog better with less Viibryd. No improvement in anxiety with gabapentin either. Still on buspirone for a long time without much change either. Plan: Wean Viibryd Dt NR. On 20 mg for 3 weeks Switch to sertraline 25 then 50 mg daily. Reduce gabapentin to 300 mg TID  05/14/21 appt noted: No SE.  Limited effect from sertraline 50 mg daily. Still anxious.   Wonders about whether should buspirone benefit. ? Effect.  Same dose for 3 years. Sleep fine with Ambien. Plan: Increase sertraline to 100 mg daily. OK continue AmBien 2.5 helps sleep Continue gabapentin 300 mg 3 times daily for gabapentin Continue buspirone 10 mg 3 times daily ( option wean it, or increase it.) Option wean buspirone once benefit of sertraline. Xanax 0.5 mg 1/2 twice daily as needed anxiety.  08/12/2021 appointment with the following noted: I might be a little bit better with increase sertraline to 100 but no  SE.  Willing to increase. Sleep [name] with Ambien.  Weaned down over the year.s No SE. Xanax average 3 times weekly.   No physical sx of anxiety usually. Asks about propranolol per card for palpitations.  Plan: Increase sertraline to 150 mg daily. OK continue AmBien 2.5 helps sleep Increase buspirone 20 mg 3 times daily to see if it could work better. Option wean buspirone once benefit of sertraline. Xanax 0.5 mg 1/2 twice  daily as needed anxiety.  10/22/21 appt noted: I think I have noticed a little bit better. SE mild sleepiness. Changed Sertraline 150 mg HS, buspirone 20 mg TID Still has anxiety and wonders if there are more options for her. Xanax really helps. [name] sleeper.  Exercise helps.  Walks in the AM Sees anxiety in one of 76 YO GD. Plan: Increase sertraline to 200 mg daily. OK continue AmBien 2.5 helps sleep Continue buspirone 20 mg 3 times daily tfor now . Option wean buspirone once benefit of sertraline. Xanax 0.5 mg 1/2 twice daily as needed anxiety.  12/24/2021 appointment with the following noted: Seeing some improvement with increase sertraline 200 mg daily. Wonders if med interactions might occur. Anxiety usually underlying consistent sort of thing.  With stress may take 1/2 of Xanax if feesl pressured with time, responsibilities.   Anxiety is clearly better about 60% with sertraline. No SE Plan: wean buspirone due to an adequate response Trial clonidine off label for anxiety 0.1 mg tablets 1/2 tablet at night for 3 nights, then 1 at night for 4 nights, then 1/2 in AM and 1 at night  03/05/2022 appointment with the following noted: Occ a little lightheaded and sleepy and dry mouth.  Has helped with stress.  [place] so up  and down so minor orthostasis.   Continues sertraline 200 mg daily Xanax 0.5 mg twice weekly prn anxiety. No avoidance bc anxiety.  Reduced enjoyment from anxiety mostly social things or demands. Anxiety went down from 8 to 6/10.   No panic.  Sleep really [name].  Some weight gain. 7-8 # A lot of stress with sister in [place] who had a significant stroke.    06/19/22 appt noted: Anxiety about the same. SE lethargic and sleepy and occ lightheaded esp if stands quickly at church.  Dry mouth. Sleep well at night.  Still walks in the AM. Think that makes me feel the best is Xanax but fears addiction. Not sleepy.  Plan: benefit clonidine off label for anxiety 0.1  mg tablets 1/2 in AM and 1 at night in the past but SE Wean it DT SE and replace with clonazepam 0.5 mg once or twice daily. Reduce clonidine to 1 at night for 1 week then 1/2 at night for 1 week then stop it.  08/04/2022 phone call concerned clonazepam may be affecting her balance.  He is taking 0.5 mg every morning and 0.25 mg nightly. MD response reduced clonazepam to 0.25 mg twice daily.  09/08/22 appt noted: Felt too dopey with clonazepam and kids noticed so stopped it too for a week. Currently sertraline 200 mg daily is only psych med except zolpidem 2.5 mg HS. Would rate anxiety as 7/10 severe. Wakes with it and mostly there all day.  Chronic anxiety all her life.   2 sons responded to Wellbutrin and other on Zoloft with Wellbutrin and asks about it.  Past Psychiatric History:  Therapy Linda [name]. prescriber Linda [name] & PCP Past Psychiatric Medication Trials:  Buspirone 10 TID NR but  a little lightheaded Lexapro 15 sleepy Viibryd 40 NR About 5 years ago tried citalopram briefly Zoloft 100, paxil, duloxetine, fluoxetine.  Clonidine for anxiety SE  Gabapentin 300 TID for neuropathy for 2-3 years Xanax., clonazepam 0.5 dopey Genesight.  2 drinks nightly  Review of Systems:  Review of Systems  Constitutional:  Negative for fatigue.  Cardiovascular:  Negative for chest pain and palpitations.  Neurological:  Negative for tremors.    Medications: I have reviewed the patient's current medications.  Current Outpatient Medications  Medication Sig Dispense Refill   anastrozole (ARIMIDEX) 1 MG tablet TAKE 1 TABLET(1 MG) BY MOUTH DAILY 90 tablet 3   Ascorbic Acid (VITAMIN C) 1000 MG tablet Take 1,000 mg by mouth daily.     b complex vitamins tablet Take 1 tablet by mouth at bedtime.     calcium-vitamin D 250-100 MG-UNIT tablet Take 1 tablet by mouth 2 (two) times daily.     fluticasone (FLONASE) 50 MCG/ACT nasal spray 1 spray in each nostril     gabapentin (NEURONTIN) 300 MG  capsule TAKE 1 CAPSULE(300 MG) BY MOUTH THREE TIMES DAILY 90 capsule 0   ibandronate (BONIVA) 150 MG tablet Take 150 mg by mouth every 30 (thirty) days. Take in the morning with a full glass of water, on an empty stomach, and do not take anything else by mouth or lie down for the next 30 min.     levocetirizine (XYZAL) 2.5 MG/5ML solution Take 5 mg by mouth every evening.     Multiple Vitamin (MULTIVITAMIN WITH MINERALS) TABS tablet Take 1 tablet by mouth at bedtime.     rosuvastatin (CRESTOR) 10 MG tablet Take 1 tablet (10 mg total) by mouth daily. (Patient taking differently: Take 10 mg by mouth at bedtime.)     sertraline (ZOLOFT) 100 MG tablet TAKE 2 TABLETS(200 MG) BY MOUTH DAILY 180 tablet 0   zolpidem (AMBIEN) 5 MG tablet Take 1 tablet (5 mg total) by mouth at bedtime as needed for sleep. (Patient taking differently: Take 2.5 mg by mouth at bedtime as needed for sleep.) 20 tablet 2   clonazePAM (KLONOPIN) 0.5 MG tablet Take 1/2 -1 tab po BID prn anxiety (Patient not taking: Reported on 09/08/2022) 90 tablet 1   cloNIDine (CATAPRES) 0.1 MG tablet Take 1 tablet (0.1 mg total) by mouth daily. (Patient not taking: Reported on 09/08/2022) 3 tablet 0   No current facility-administered medications for this visit.    Medication Side Effects: None  Allergies:  Allergies  Allergen Reactions   Other Other (See Comments)    ? Seasonal allergies - classic symptoms    Past Medical History:  Diagnosis Date   Anxiety    Arthritis    Atrophic vaginitis    Breast cancer ([place])    right (DCIS) s/p lumpectomy 7/15   Cataract    BILATERAL   Cervical dysplasia 1975   DCIS (ductal carcinoma in situ)    RIGHT   Depression    Dysrhythmia    PVCs   Family history of adverse reaction to anesthesia    Pt stated that her mother had difficulty waking up after anesthesia   Family history of breast cancer    Family history of colon cancer    Family history of prostate cancer    Hx of radiation  therapy 08/02/14- 08/24/14   right reast 4256 cGy i 16 sessions, no boost   Hypercholesteremia    Insomnia    Osteopenia 11/2011   T score -1.7  FRAX not calculated due to history of bisphosphonates and current Evista   Personal history of radiation therapy 2015   right side   PVC's (premature ventricular contractions)    PMH   Seasonal allergies    Skin cancer 2006   basal cell of face    Family History  Problem Relation Age of Onset   Breast cancer Mother        DIAGNOSED IN HER 80'S   Hyperlipidemia Mother    Osteoporosis Mother    Breast cancer Sister        DIAGNOSED AT AGE 79   Hypertension Maternal Grandmother    Breast cancer Maternal Grandmother        possibly breast cancer; had mastectomy in her 30s-40s, but don't know if it was due to cancer   Heart disease Maternal Grandfather    Stroke Maternal Grandfather    Diabetes Son        TYPE 1 DIABETES   Colon cancer Paternal Grandfather    Cancer Paternal Grandfather        colon   Heart failure Son    Kidney cancer Paternal Grandmother        bladder/kidney cancer   Prostate cancer Maternal Uncle     Social History   Socioeconomic History   Marital status: Married    Spouse name: Not on file   Number of children: Not on file   Years of education: Not on file   Highest education level: Not on file  Occupational History   Not on file  Tobacco Use   Smoking status: Former    Types: Cigarettes    Quit date: 05/19/1974    Years since quitting: 48.3   Smokeless tobacco: Never   Tobacco comments:    smoked in college  Vaping Use   Vaping Use: Never used  Substance and Sexual Activity   Alcohol use: Yes    Alcohol/week: 10.0 standard drinks of alcohol    Types: 10 Standard drinks or equivalent per week    Comment: daily ETOH cocktail nightly   Drug use: No   Sexual activity: Not Currently    Birth control/protection: Post-menopausal    Comment: G4 P4   Other Topics Concern   Not on file  Social History  Narrative   Pt lives in [place] with spouse.  4 children and 8 grandchildren.   House wife   Social Determinants of Radio broadcast assistant Strain: Not on file  Food Insecurity: Not on file  Transportation Needs: Not on file  Physical Activity: Not on file  Stress: Not on file  Social Connections: Not on file  Intimate Partner Violence: Not on file    Past Medical History, Surgical history, Social history, and Family history were reviewed and updated as appropriate.   Please see review of systems for further details on the patient's review from today.   Objective:   Physical Exam:  There were no vitals taken for this visit.  Physical Exam Constitutional:      General: She is not in acute distress. Musculoskeletal:        General: No deformity.  Neurological:     Coordination: Coordination normal.  Psychiatric:        Attention and Perception: Attention normal. She does not perceive auditory or visual hallucinations.        Mood and Affect: Mood is anxious. Affect is not labile, blunt, angry, tearful or inappropriate.        Speech:  Speech normal.        Behavior: Behavior normal.        Thought Content: Thought content normal. Thought content is not delusional. Thought content does not include homicidal or suicidal ideation.        Cognition and Memory: Cognition normal.        Judgment: Judgment normal.     Comments: Anxity better not gone     Lab Review:     Component Value Date/Time   NA 140 10/01/2018 0628   NA 140 06/30/2017 0904   K 4.1 10/01/2018 0628   CL 110 10/01/2018 0628   CO2 25 10/01/2018 0628   GLUCOSE 90 10/01/2018 0628   BUN 17 10/01/2018 0628   BUN 23 06/30/2017 0904   CREATININE 0.94 10/01/2018 0628   CALCIUM 8.8 (L) 10/01/2018 0628   PROT 6.1 (L) 10/01/2018 0628   ALBUMIN 3.6 10/01/2018 0628   AST 23 10/01/2018 0628   ALT 18 10/01/2018 0628   ALKPHOS 31 (L) 10/01/2018 0628   BILITOT 0.6 10/01/2018 0628   GFRNONAA 59 (L)  10/01/2018 0628   GFRAA >60 10/01/2018 0628       Component Value Date/Time   WBC 5.1 09/26/2020 0942   RBC 4.10 09/26/2020 0942   HGB 13.7 09/26/2020 0942   HCT 42.4 09/26/2020 0942   PLT 231 09/26/2020 0942   MCV 103.4 (H) 09/26/2020 0942   MCH 33.4 09/26/2020 0942   MCHC 32.3 09/26/2020 0942   RDW 12.9 09/26/2020 0942   LYMPHSABS 1.6 06/08/2014 1330   MONOABS 0.6 06/08/2014 1330   EOSABS 0.2 06/08/2014 1330   BASOSABS 0.0 06/08/2014 1330    No results found for: "POCLITH", "LITHIUM"   No results found for: "PHENYTOIN", "PHENOBARB", "VALPROATE", "CBMZ"   .res Assessment: Plan:    Ikran was seen today for follow-up and anxiety.  Diagnoses and all orders for this visit:  Generalized anxiety disorder  Insomnia due to mental condition    Greater than 50% of 30 min face to face time with patient was spent on counseling and coordination of care. We Discussed diagnosis and treatment plan.  Again.  Answered questions about the different mechanisms of different medications and their mechanisms specifically for anxiety including the sertraline, clonidine and Xanax..  Discussed the side effects at length.  Overall she has had about 60% reduction in anxiety with sertraline 200 mg daily but like to have further improvement if possible.  Multiple med failures and intolerances.  Next category option would be atypical like olanzapine.  Unlikely to get enough benefit from other antidepressants for anxiety.  Genesight testing results disc at length again.  Suggested Viibryd but never helped after several weeks.   Did genetic testing 08/29/20 per her request..  Have reviewed the results with her in some detail.  We covered each of the normal and abnormal for pharmodynamic genes and pharmacokinetic genes as well as which meds are metabolized by which genes and how that is applied.  It was discussed that this is not the complete in total genetic information that is relevant to the use of  psychiatric meds but it contains the currently available genetic information.  Option propranolol  continue sertraline to 200 mg daily. OK continue AmBien 2.5 helps sleep  Answered questions about Xanax  and BZ use. Worries over dependence and disc use up to 4 days/week. We discussed the short-term risks associated with benzodiazepines including sedation and increased fall risk among others.  Discussed long-term side effect risk  including dependence, potential withdrawal symptoms, and the potential eventual dose-related risk of dementia.  But recent studies from 2020 dispute this association between benzodiazepines and dementia risk. Newer studies in 2020 do not support an association with dementia. She does exercise including balance issues.   Of the BZ lorazepam is usually one with least cognitive and balance SE, 0.5 mg BID Disc shortage might be a problem.  Continue gabapentin back to 300 TID which she's taking for neuropathy. No sleeepiness it. Option increase but not a great med for anxiety Discussed side effects including dizziness in particular and balance trouble as well as fatigue and potential for weight gain.  FU 8 weeks.  Lynder Parents, MD, DFAPA   Please see After Visit Summary for patient specific instructions.  Future Appointments  Date Time Provider [place]  06/25/2023 11:30 AM Linda Lose, MD [facility] None    No orders of the defined types were placed in this encounter.    -------------------------------

## 2022-09-09 DIAGNOSIS — F411 Generalized anxiety disorder: Secondary | ICD-10-CM | POA: Diagnosis not present

## 2022-09-22 IMAGING — MG MM BREAST LOCALIZATION CLIP
6 series · 6 of 18 positions shown · non-contrast
Comparison: Previous exam(s).
COMPARISON: Previous exam(s).

Addendum:
CLINICAL DATA: Confirmation of clip placement after MRI guided core
needle biopsy of 2 areas of masslike enhancement in the RIGHT
breast.

EXAM:
2D AND TOMOSYNTHESIS DIAGNOSTIC RIGHT MAMMOGRAM POST MRI BIOPSY x 2

[R CC synth-2D]
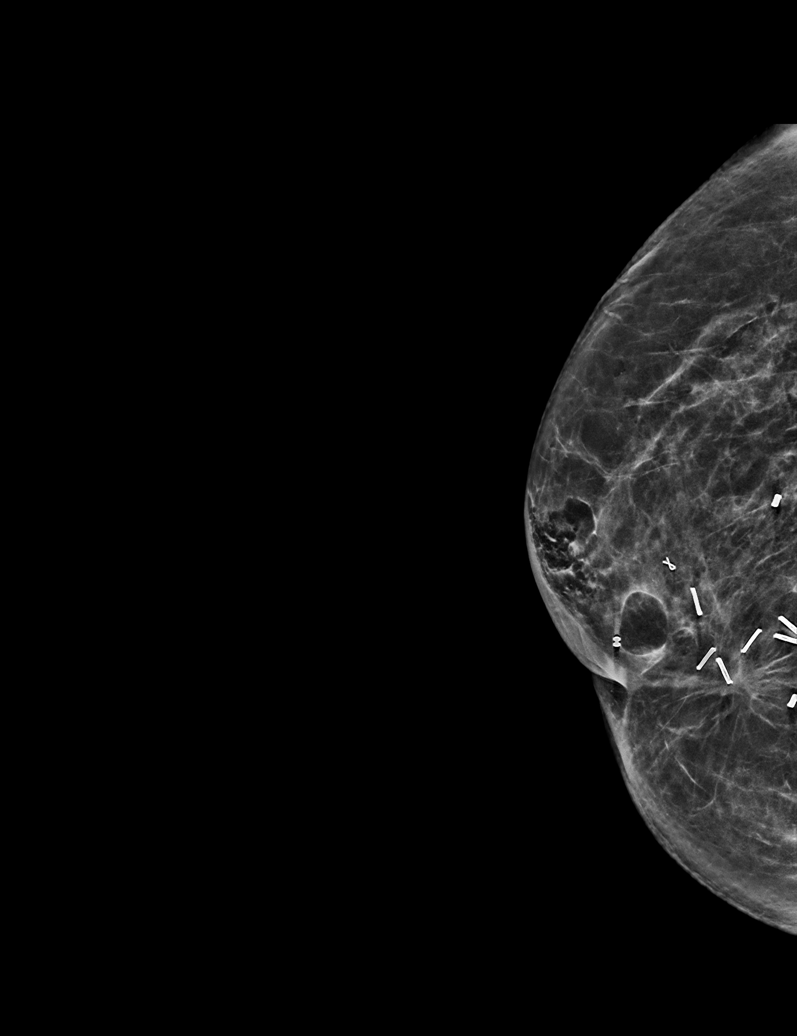

[R ML synth-2D (1 of 2)]
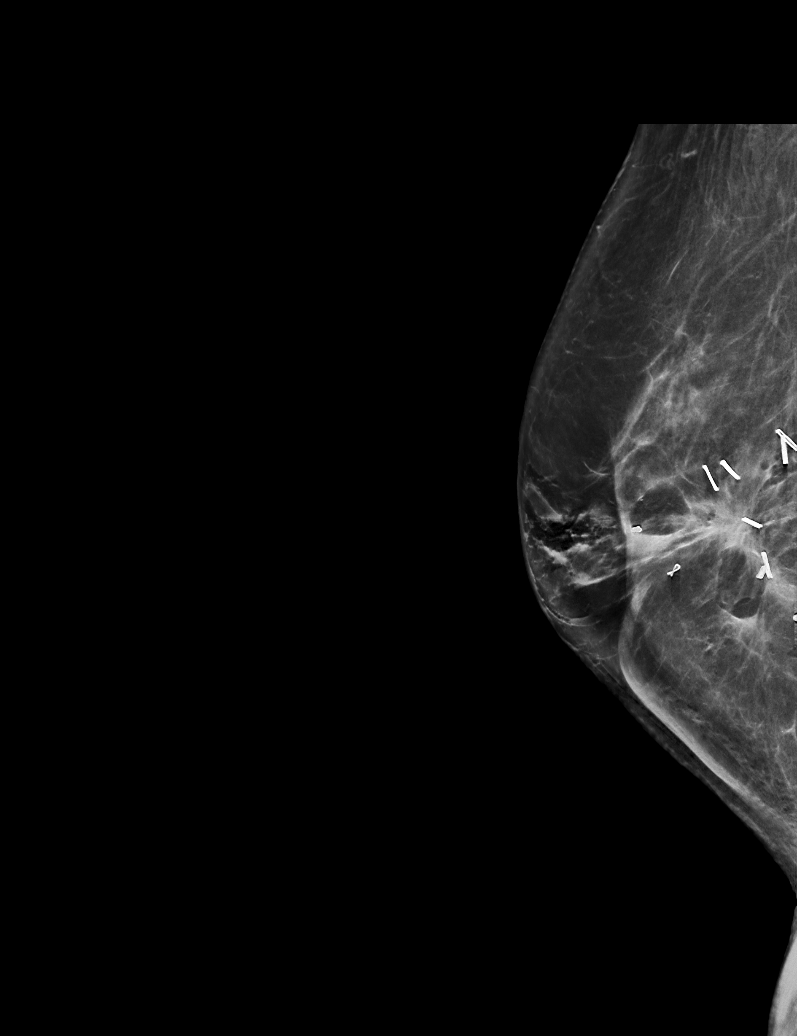

[R ML synth-2D (2 of 2)]
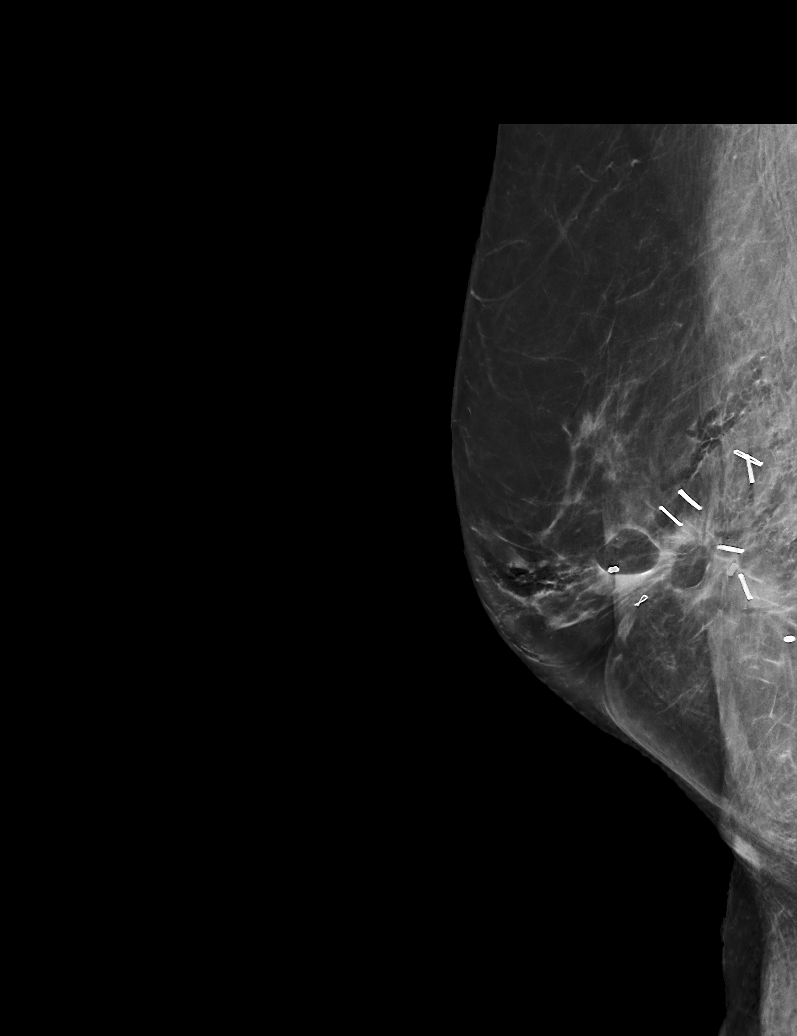

[R ML tomo (1 of 2) · tomo slice 41/82.0]
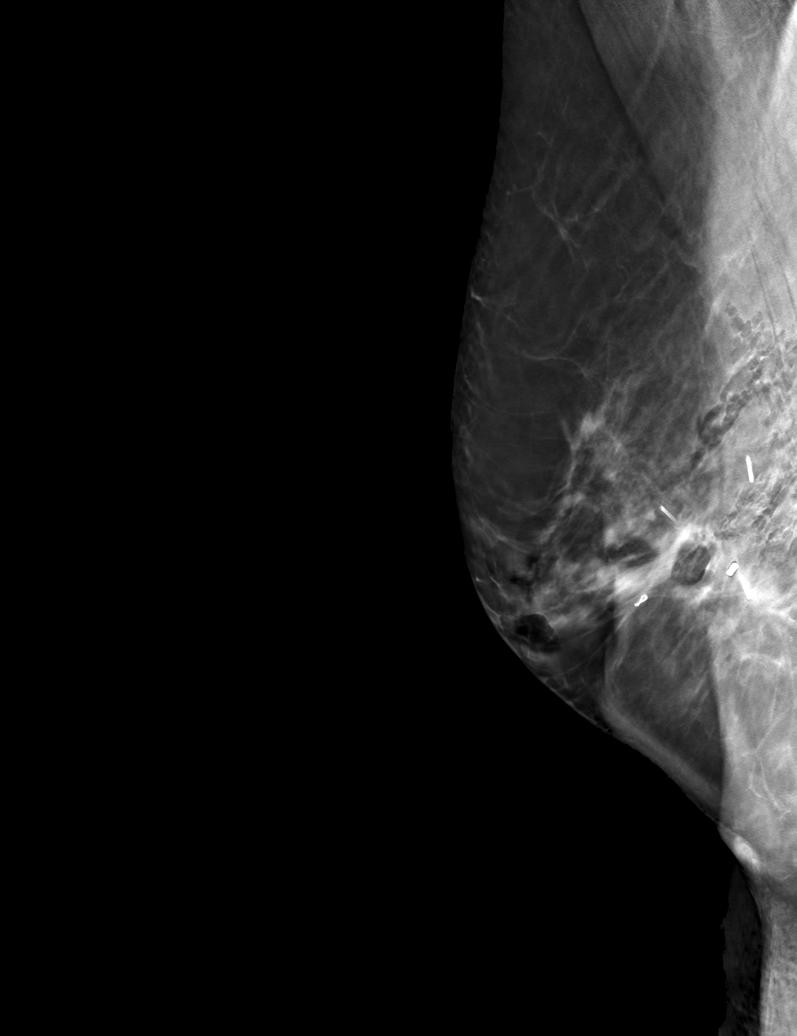

[R CC tomo · tomo slice 31/60.0]
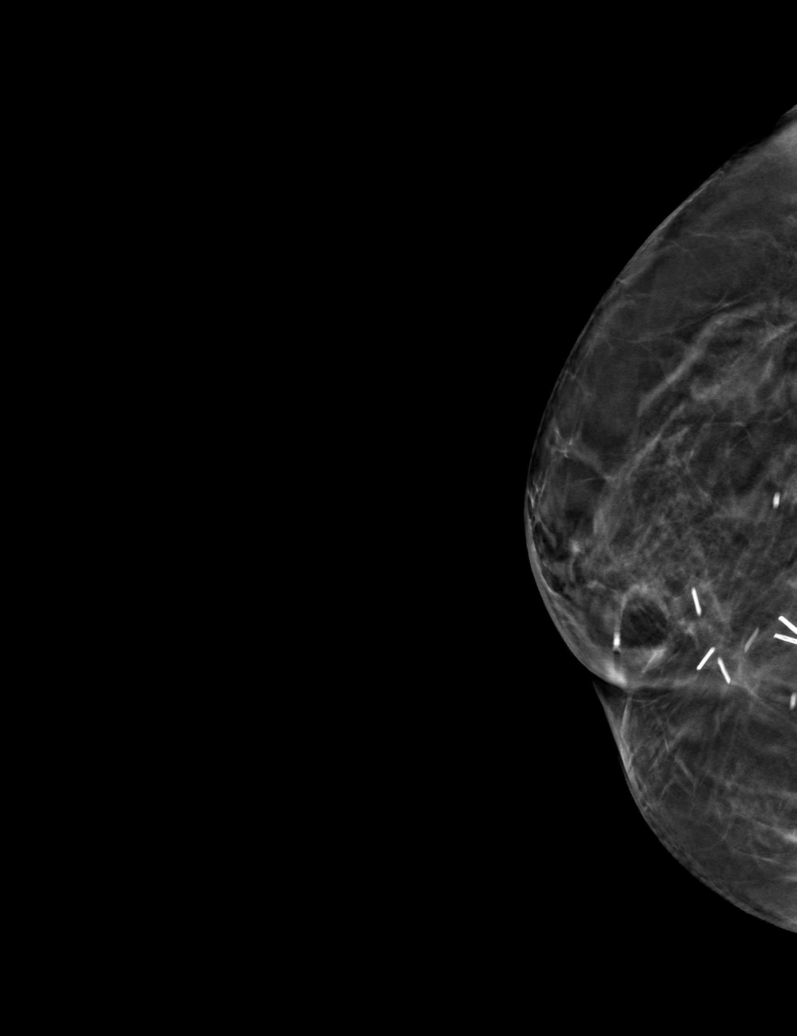

[R ML tomo (2 of 2) · tomo slice 37/73.0]
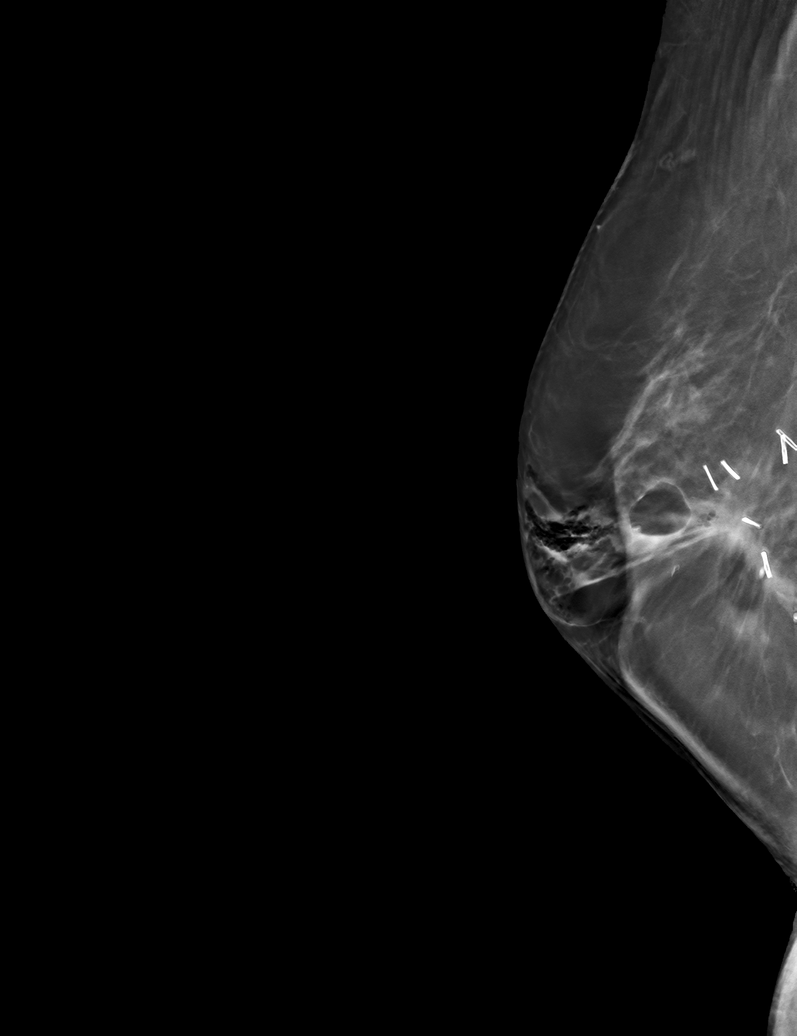

[6 of 18 positions shown; findings below may reference images not displayed]

FINDINGS: Tomosynthesis and synthesized full field CC and mediolateral images
were obtained following MRI guided biopsy of 2 areas of mass-like
enhancement in the RIGHT breast.

The barbell shaped tissue marking clip is appropriately position at
the site of the biopsied mass-like enhancement in the INNER breast
adjacent to the lumpectomy site, localizing to the slight LOWER
INNER QUADRANT.

The cylinder shaped tissue marker clip is appropriately positioned
at the site of the biopsied mass-like enhancement in the LOWER OUTER
QUADRANT at POSTERIOR depth.

Expected post biopsy changes are present at both sites without
evidence of hematoma.
IMPRESSION: 1. Appropriate positioning of the barbell shaped tissue marker clip
at the site of biopsied mass-like enhancement in the INNER breast
adjacent to the lumpectomy site.
2. Appropriate positioning of the cylinder shaped tissue marker clip
at the site of biopsied mass-like enhancement in the LOWER OUTER
QUADRANT of the RIGHT breast at POSTERIOR depth.

Final Assessment: Post Procedure Mammograms for Marker Placement

ADDENDUM:
Please note that the barbell clip and the cylinder clip are
approximately 4 cm apart.

*** End of Addendum ***
FINDINGS: Tomosynthesis and synthesized full field CC and mediolateral images
were obtained following MRI guided biopsy of 2 areas of mass-like
enhancement in the RIGHT breast.

The barbell shaped tissue marking clip is appropriately position at
the site of the biopsied mass-like enhancement in the INNER breast
adjacent to the lumpectomy site, localizing to the slight LOWER
INNER QUADRANT.

The cylinder shaped tissue marker clip is appropriately positioned
at the site of the biopsied mass-like enhancement in the LOWER OUTER
QUADRANT at POSTERIOR depth.

Expected post biopsy changes are present at both sites without
evidence of hematoma.
IMPRESSION: 1. Appropriate positioning of the barbell shaped tissue marker clip
at the site of biopsied mass-like enhancement in the INNER breast
adjacent to the lumpectomy site.
2. Appropriate positioning of the cylinder shaped tissue marker clip
at the site of biopsied mass-like enhancement in the LOWER OUTER
QUADRANT of the RIGHT breast at POSTERIOR depth.

Final Assessment: Post Procedure Mammograms for Marker Placement

## 2022-09-22 IMAGING — MR MR BREAST BX W/ LOC DEV 1ST LEASION IMAGE BX SPEC MR GUIDE*R*
7 of 10 series · 30 of 48 positions shown · IV contrast (7ml gadaivst)
Comparison: Previous exams.
COMPARISON: Previous exams.

Addendum:
CLINICAL DATA: 74-year-old with a personal history of malignant
lumpectomy of the INNER RIGHT breast in 4885. Recent diagnostic MRI
demonstrated masslike enhancement in the INNER RIGHT breast adjacent
to the lumpectomy site measuring 1.0 cm and masslike enhancement in
the LOWER OUTER QUADRANT of the RIGHT breast at POSTERIOR depth
measuring 0.8 cm.

EXAM:
MRI GUIDED CORE NEEDLE BIOPSY OF THE RIGHT BREAST x 2
TECHNIQUE: Multiplanar, multisequence MR imaging of the RIGHT breast was
performed both before and after administration of intravenous
contrast.
CONTRAST:  7mL GADAVIST GADOBUTROL 1 MMOL/ML IV.

[Series 5: fiducial unilateral · sagittal · 2.0mm · 1.33mm/px · 3 of 52 slices shown]
[im 1/52]
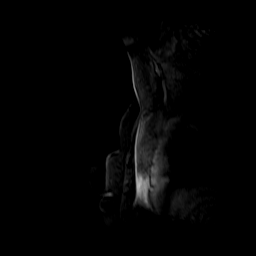
[im 26/52]
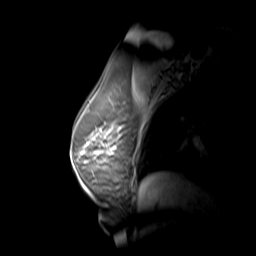
[im 52/52]
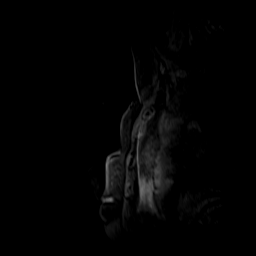

[Series 6: dynamic pre · axial · non-contrast · 1.3mm · 0.73mm/px · z∈[-103,+104]mm · 5 of 160 slices shown]
[im 1/160]
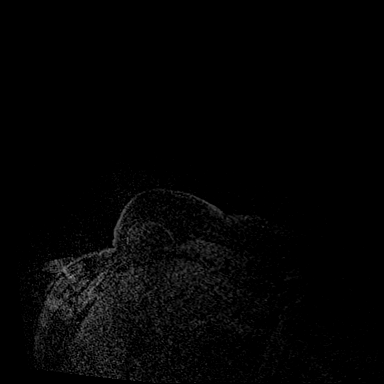
[im 40/160]
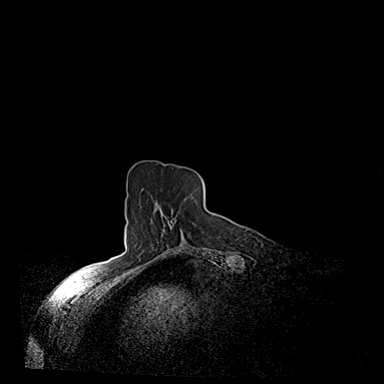
[im 80/160]
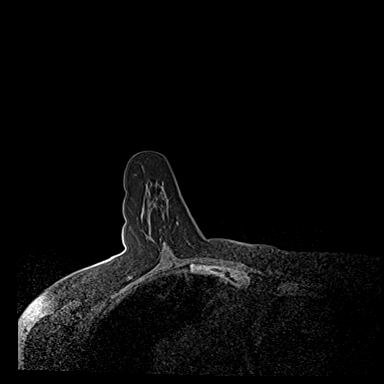
[im 120/160]
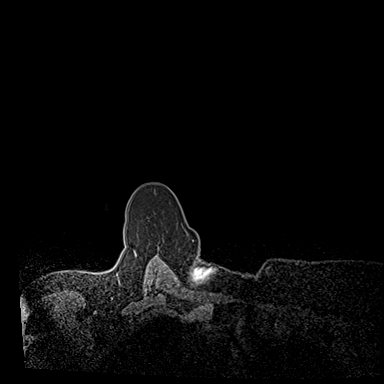
[im 160/160]
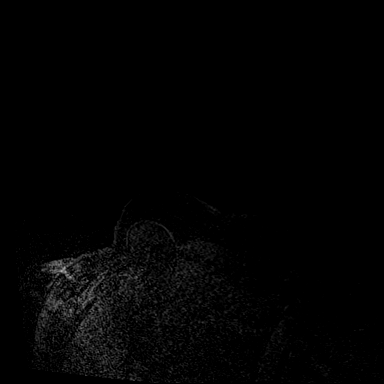

[Series 7: dynamic post 20 · axial · 1.3mm · 0.73mm/px · z∈[-103,+104]mm · 5 of 160 slices shown (1 of 2)]
[im 1/160]
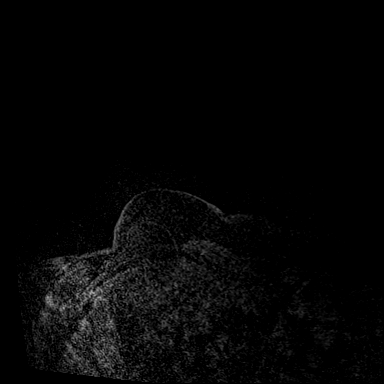
[im 40/160]
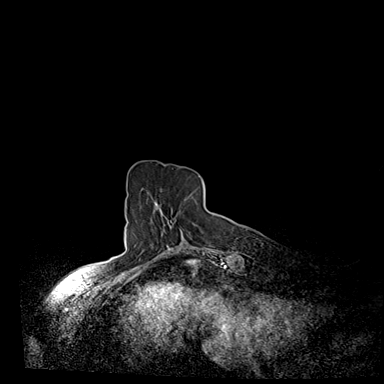
[im 80/160]
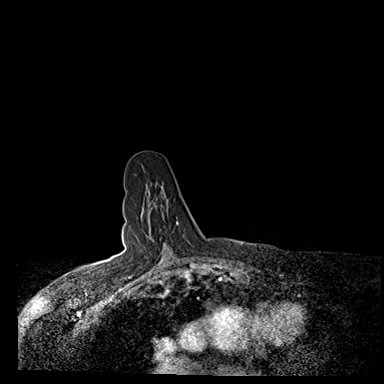
[im 120/160]
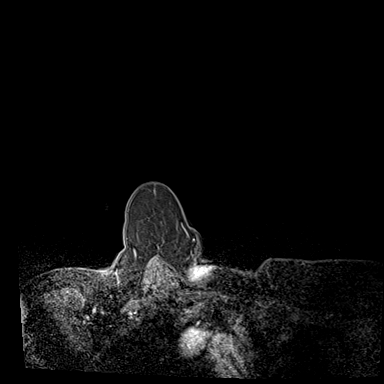
[im 160/160]
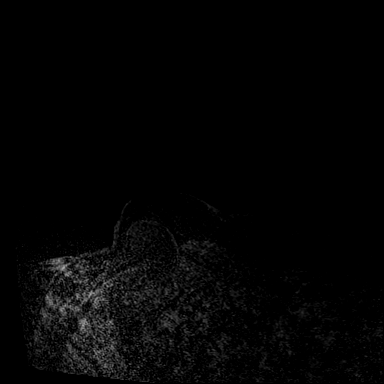

[Series 8: dynamic post 20 · axial · 1.3mm · 0.73mm/px · z∈[-103,+104]mm · 5 of 160 slices shown (2 of 2)]
[im 1/160]
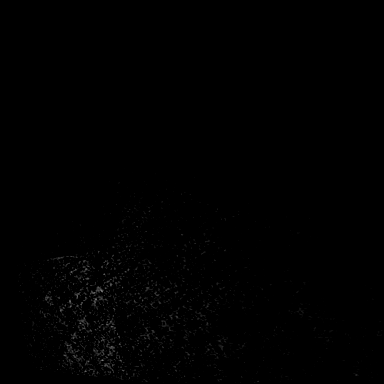
[im 40/160]
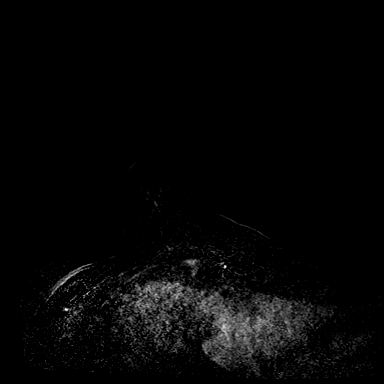
[im 80/160]
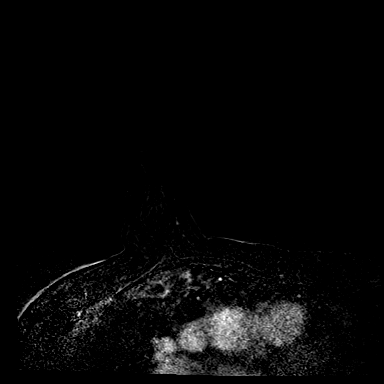
[im 120/160]
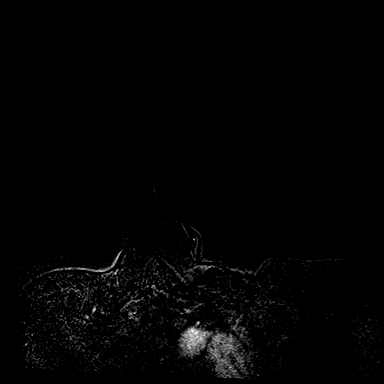
[im 160/160]
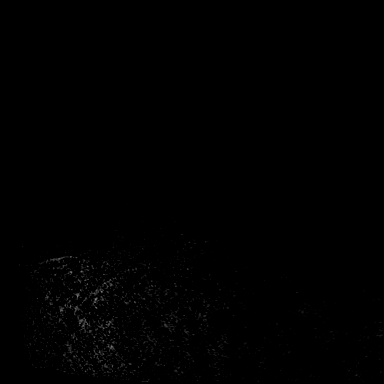

[Series 9: dynamic post 3 · axial · 1.3mm · 0.73mm/px · z∈[-103,+104]mm · 5 of 160 slices shown (1 of 2)]
[im 1/160]
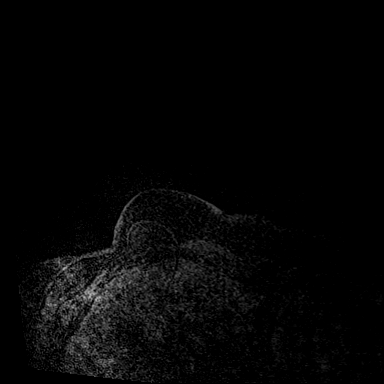
[im 40/160]
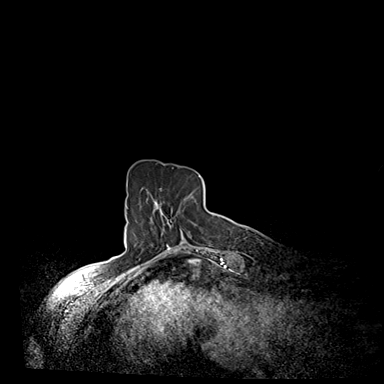
[im 80/160]
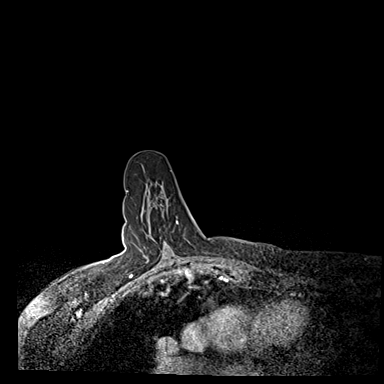
[im 120/160]
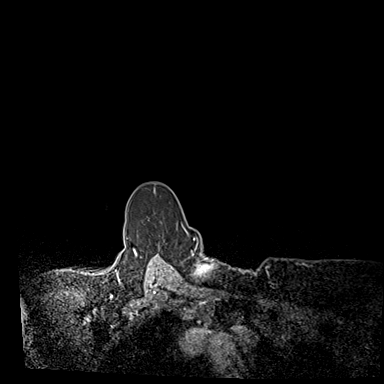
[im 160/160]
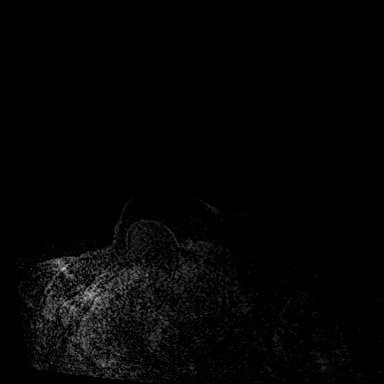

[Series 10: dynamic post 3 · axial · 1.3mm · 0.73mm/px · z∈[-103,+104]mm · 5 of 160 slices shown (2 of 2)]
[im 1/160]
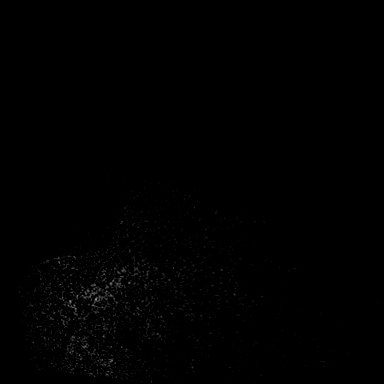
[im 40/160]
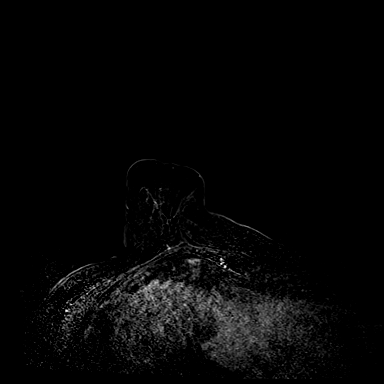
[im 80/160]
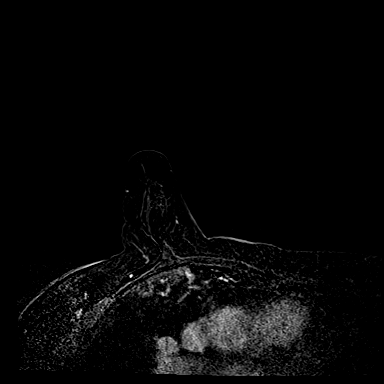
[im 120/160]
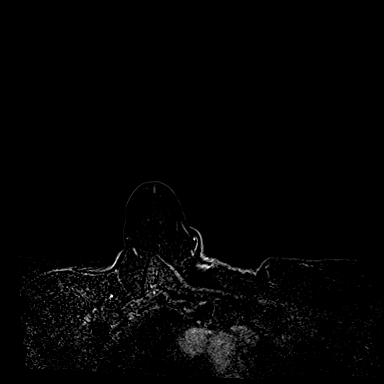
[im 160/160]
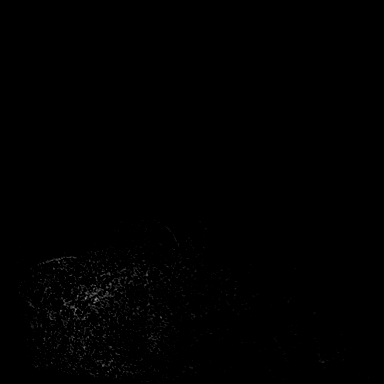

[Series 11: needle confirmation · axial · 1.3mm · 0.73mm/px · z∈[-103,-52]mm · 2 of 160 slices shown]
[im 1/160]
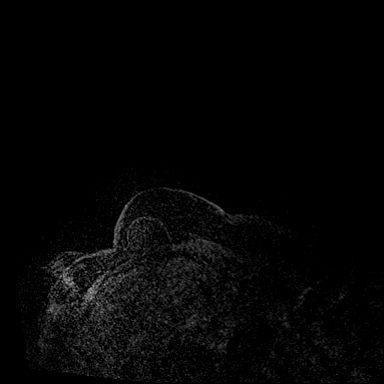
[im 40/160]
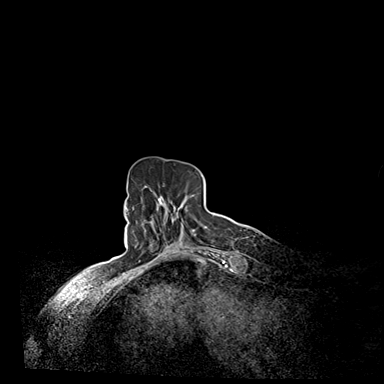

[30 of 48 positions shown; findings below may reference images not displayed]

FINDINGS: I met with the patient, and we discussed the procedure of MRI guided
biopsy, including risks, benefits, and alternatives. Specifically,
we discussed the risks of infection, bleeding, tissue injury, clip
migration, and inadequate sampling. Informed, written consent was
given. The usual time out protocol was performed immediately prior
to the procedure.

# 1) MEDIAL breast, lesion quadrant: Slight LOWER INNER QUADRANT.

Using sterile technique with chlorhexidine as skin antisepsis, 1%
lidocaine and 1% lidocaine with epinephrine as local anesthetic,
using MRI guidance, a 9 gauge vacuum assisted device was used to
perform biopsy of the masslike enhancement adjacent to the
lumpectomy scar in the INNER RIGHT breast using a lateral approach.
At the conclusion of the procedure, a barbell shaped tissue marker
clip was deployed into the biopsy cavity.

# 2) Lesion quadrant: LOWER OUTER QUADRANT.

Using sterile technique with chlorhexidine as skin antisepsis, 1%
lidocaine and 1% lidocaine with epinephrine as local anesthetic,
using MRI guidance, a 9 gauge vacuum assisted device was used
perform biopsy of the masslike enhancement in the LOWER OUTER
QUADRANT of the RIGHT breast at POSTERIOR depth using a LATERAL
approach. At the conclusion of the procedure, a cylinder shaped
tissue marker clip was deployed into the biopsy cavity.

Follow-up 2-view mammogram was performed and dictated separately.
IMPRESSION: MRI guided biopsy of masslike enhancement involving INNER RIGHT
breast adjacent to the prior lumpectomy scar and masslike
enhancement involving the LOWER OUTER QUADRANT of the RIGHT breast.
No apparent complications.

ADDENDUM:
Pathology revealed GRADE II INVASIVE DUCTAL CARCINOMA, DUCTAL
CARCINOMA IN SITU of the RIGHT breast, medial. This was found to be
concordant by Dr. Nimisha Schilling.

Pathology revealed DUCTAL CARCINOMA IN SITU of the RIGHT breast,
lower outer quadrant. This was found to be concordant by Dr. Holtion
Harumuryango.

The clips placed at the biopsy sites are approximately 4 cm apart.

Pathology results were discussed with the patient by telephone. The
patient reported doing well after the biopsies with tenderness at
the sites. Post biopsy instructions and care were reviewed and
questions were answered. The patient was encouraged to call The

Surgical consultation has been arranged with Dr. Hamayak Way
at [REDACTED] on September 18, 2020.

Pathology results reported by Oliverandniza Merpati RN on 09/06/2020.

*** End of Addendum ***
FINDINGS: I met with the patient, and we discussed the procedure of MRI guided
biopsy, including risks, benefits, and alternatives. Specifically,
we discussed the risks of infection, bleeding, tissue injury, clip
migration, and inadequate sampling. Informed, written consent was
given. The usual time out protocol was performed immediately prior
to the procedure.

# 1) MEDIAL breast, lesion quadrant: Slight LOWER INNER QUADRANT.

Using sterile technique with chlorhexidine as skin antisepsis, 1%
lidocaine and 1% lidocaine with epinephrine as local anesthetic,
using MRI guidance, a 9 gauge vacuum assisted device was used to
perform biopsy of the masslike enhancement adjacent to the
lumpectomy scar in the INNER RIGHT breast using a lateral approach.
At the conclusion of the procedure, a barbell shaped tissue marker
clip was deployed into the biopsy cavity.

# 2) Lesion quadrant: LOWER OUTER QUADRANT.

Using sterile technique with chlorhexidine as skin antisepsis, 1%
lidocaine and 1% lidocaine with epinephrine as local anesthetic,
using MRI guidance, a 9 gauge vacuum assisted device was used
perform biopsy of the masslike enhancement in the LOWER OUTER
QUADRANT of the RIGHT breast at POSTERIOR depth using a LATERAL
approach. At the conclusion of the procedure, a cylinder shaped
tissue marker clip was deployed into the biopsy cavity.

Follow-up 2-view mammogram was performed and dictated separately.
IMPRESSION: MRI guided biopsy of masslike enhancement involving INNER RIGHT
breast adjacent to the prior lumpectomy scar and masslike
enhancement involving the LOWER OUTER QUADRANT of the RIGHT breast.
No apparent complications.

## 2022-09-30 ENCOUNTER — Other Ambulatory Visit: Payer: Self-pay | Admitting: Psychiatry

## 2022-09-30 DIAGNOSIS — F411 Generalized anxiety disorder: Secondary | ICD-10-CM

## 2022-10-01 ENCOUNTER — Other Ambulatory Visit: Payer: Self-pay | Admitting: Psychiatry

## 2022-10-01 DIAGNOSIS — F411 Generalized anxiety disorder: Secondary | ICD-10-CM

## 2022-10-07 DIAGNOSIS — F411 Generalized anxiety disorder: Secondary | ICD-10-CM | POA: Diagnosis not present

## 2022-10-23 DIAGNOSIS — H04123 Dry eye syndrome of bilateral lacrimal glands: Secondary | ICD-10-CM | POA: Diagnosis not present

## 2022-10-23 DIAGNOSIS — H5213 Myopia, bilateral: Secondary | ICD-10-CM | POA: Diagnosis not present

## 2022-10-27 ENCOUNTER — Other Ambulatory Visit: Payer: Self-pay | Admitting: Psychiatry

## 2022-10-27 DIAGNOSIS — F411 Generalized anxiety disorder: Secondary | ICD-10-CM

## 2022-10-28 DIAGNOSIS — F411 Generalized anxiety disorder: Secondary | ICD-10-CM | POA: Diagnosis not present

## 2022-11-16 ENCOUNTER — Ambulatory Visit (INDEPENDENT_AMBULATORY_CARE_PROVIDER_SITE_OTHER): Payer: PPO

## 2022-11-16 ENCOUNTER — Ambulatory Visit (HOSPITAL_COMMUNITY)
Admission: EM | Admit: 2022-11-16 | Discharge: 2022-11-16 | Disposition: A | Discharge status: Home / Self Care | Payer: PPO | Attending: Internal Medicine | Admitting: Internal Medicine

## 2022-11-16 ENCOUNTER — Encounter (HOSPITAL_COMMUNITY): Payer: Self-pay | Admitting: Emergency Medicine

## 2022-11-16 DIAGNOSIS — S2222XA Fracture of body of sternum, initial encounter for closed fracture: Secondary | ICD-10-CM

## 2022-11-16 DIAGNOSIS — S29011A Strain of muscle and tendon of front wall of thorax, initial encounter: Secondary | ICD-10-CM | POA: Diagnosis not present

## 2022-11-16 DIAGNOSIS — R079 Chest pain, unspecified: Secondary | ICD-10-CM | POA: Diagnosis not present

## 2022-11-16 DIAGNOSIS — R0789 Other chest pain: Secondary | ICD-10-CM
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
## 2022-11-18 ENCOUNTER — Emergency Department (HOSPITAL_BASED_OUTPATIENT_CLINIC_OR_DEPARTMENT_OTHER)
Admission: EM | Admit: 2022-11-18 | Discharge: 2022-11-18 | Disposition: A | Discharge status: Home / Self Care | Payer: PPO | Attending: Emergency Medicine | Admitting: Emergency Medicine

## 2022-11-18 ENCOUNTER — Encounter (HOSPITAL_BASED_OUTPATIENT_CLINIC_OR_DEPARTMENT_OTHER): Payer: Self-pay | Admitting: Emergency Medicine

## 2022-11-18 ENCOUNTER — Emergency Department (HOSPITAL_BASED_OUTPATIENT_CLINIC_OR_DEPARTMENT_OTHER): Payer: PPO

## 2022-11-18 DIAGNOSIS — J90 Pleural effusion, not elsewhere classified: Secondary | ICD-10-CM | POA: Diagnosis not present

## 2022-11-18 DIAGNOSIS — R519 Headache, unspecified: Secondary | ICD-10-CM | POA: Diagnosis not present

## 2022-11-18 DIAGNOSIS — S0990XA Unspecified injury of head, initial encounter: Secondary | ICD-10-CM | POA: Insufficient documentation

## 2022-11-18 DIAGNOSIS — Y92481 Parking lot as the place of occurrence of the external cause: Secondary | ICD-10-CM | POA: Diagnosis not present

## 2022-11-18 DIAGNOSIS — Y9389 Activity, other specified: Secondary | ICD-10-CM | POA: Insufficient documentation

## 2022-11-18 DIAGNOSIS — M4312 Spondylolisthesis, cervical region: Secondary | ICD-10-CM | POA: Diagnosis not present

## 2022-11-18 DIAGNOSIS — S2222XA Fracture of body of sternum, initial encounter for closed fracture: Secondary | ICD-10-CM | POA: Diagnosis not present

## 2022-11-18 DIAGNOSIS — M542 Cervicalgia: Secondary | ICD-10-CM | POA: Diagnosis not present

## 2022-11-18 DIAGNOSIS — S299XXA Unspecified injury of thorax, initial encounter: Secondary | ICD-10-CM | POA: Diagnosis present

## 2022-11-18 DIAGNOSIS — S2220XA Unspecified fracture of sternum, initial encounter for closed fracture: Secondary | ICD-10-CM | POA: Diagnosis not present

## 2022-11-18 MED ORDER — LIDOCAINE 5 % EX PTCH: 1.0000 | MEDICATED_PATCH | CUTANEOUS | 0 refills | Status: DC

## 2022-11-18 MED ORDER — LIDOCAINE 5 % EX PTCH
1.0000 | MEDICATED_PATCH | CUTANEOUS | Status: DC
  Administered 2022-11-18: 1 via TRANSDERMAL
  Filled 2022-11-18: qty 1

## 2022-11-18 MED ORDER — HYDROCODONE-ACETAMINOPHEN 5-325 MG PO TABS
1.0000 | ORAL_TABLET | Freq: Once | ORAL | Status: AC
  Administered 2022-11-18: 1 via ORAL
  Filled 2022-11-18: qty 1

## 2022-11-18 MED ORDER — OXYCODONE HCL 5 MG PO TABS: 2.5000 mg | ORAL_TABLET | ORAL | 0 refills | Status: DC | PRN
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
## 2022-11-28 ENCOUNTER — Institutional Professional Consult (permissible substitution): Payer: PPO | Admitting: Thoracic Surgery (Cardiothoracic Vascular Surgery)

## 2022-11-28 VITALS — BP 119/73 | HR 76 | Resp 20 | Ht 67.0 in | Wt 154.0 lb

## 2022-11-28 DIAGNOSIS — S2220XA Unspecified fracture of sternum, initial encounter for closed fracture: Secondary | ICD-10-CM | POA: Diagnosis not present

## 2022-11-30 ENCOUNTER — Other Ambulatory Visit: Payer: Self-pay | Admitting: Psychiatry

## 2022-11-30 DIAGNOSIS — F411 Generalized anxiety disorder: Secondary | ICD-10-CM

## 2022-12-02 ENCOUNTER — Encounter: Payer: Self-pay | Admitting: Psychiatry

## 2022-12-02 ENCOUNTER — Ambulatory Visit (INDEPENDENT_AMBULATORY_CARE_PROVIDER_SITE_OTHER): Payer: PPO | Admitting: Psychiatry

## 2022-12-02 DIAGNOSIS — F5105 Insomnia due to other mental disorder: Secondary | ICD-10-CM | POA: Diagnosis not present

## 2022-12-02 DIAGNOSIS — F411 Generalized anxiety disorder: Secondary | ICD-10-CM | POA: Diagnosis not present

## 2022-12-02 MED ORDER — SERTRALINE HCL 100 MG PO TABS: 200.0000 mg | ORAL_TABLET | Freq: Every day | ORAL | 1 refills | Status: AC

## 2022-12-02 NOTE — Patient Instructions (Signed)
Reduce lorazepam to 0.25 mg BID for 1 week then stop it.

## 2022-12-02 NOTE — Progress Notes (Signed)
Linda [name] [id number] 09-02-1946 77 y.o.   Subjective:   Patient ID:  Linda [name] is a 77 y.o. (DOB 1946-03-24) female.  Chief Complaint:  Chief Complaint  Patient presents with   Follow-up   Anxiety    HPI Linda [name] presents to the office today for follow-up of generalized anxiety disorder. First seen February 21, 2020 Lexapro 5 mg was added to buspirone.  She continued Ambien 5 mg prn.  05/02/2020 appointment the following is noted:  Still anxious without change with Lexapro.  No SE.  Asks about genetic testing.   Takes Xanax only about weekly but wants to continue it.  Ambien 2.5 mg regularluy. Plan: If increase Lexapro does not help then do Genesight testing. Continue Lexapro 10 mg daily Continue buspirone 10 mg 3 times daily Gabapentin 300 TID Xanax 0.5 mg twice daily as needed anxiety.  06/28/2120 appt with the following noted: Doesn't think much difference with Lexapro 10.  Initially sleepy but it resolved.  Chronic worry and can't stop my head.  No SE. Wonders about [employer] Needs Ambien 2.5 mg HS to sleep. Still on others noted and wants better benefit.  Not depressed. Patient reports stable mood and denies depressed or irritable moods.With meds Patient denies difficulty with sleep initiation or maintenance. Denies appetite disturbance.  Patient reports that energy and motivation have been [name].  Patient denies any difficulty with concentration.  Patient denies any suicidal ideation. Plan: Rec increase  Lexapro 15 mg daily Continue buspirone 10 mg 3 times daily Xanax 0.5 mg twice daily as needed anxiety. Gensight testing  08/29/20 appt noted: No particular improvements with incrase in Lexapro and is a little more sleepy. Anxiety is about 7/10.  Not depressed. Switch Lexapro to Viibryd 20. Continue buspirone 10 mg 3 times daily Xanax 0.5 mg twice daily as needed anxiety.  10/16/20 appt with following noted: Sleepiness better off Lexapro 15.   No SE or benefit with Viibryd. No depressed.  Anxiety 5/10.   Sleep is [name].   Plan: Increase Viibryd to 30 mg for 2 week then increase to 40 mg daily. Continue buspirone 10 mg 3 times daily Xanax 0.5 mg twice daily as needed anxiety.  12/11/2020 appt with following noted: No sig change anxiety so far.  Rare lightheaded.  Plan: Increase gabapentin 400 TID off label for anxiety Give Viibryd 40 mg more time  03/18/2021 appointment with the following noted: Got lightheaded and forgetful with Viibryd 40 after awhaile and cut dose in 1/2.  Never saw a difference in axiety with the 2 doses.  Brain fog better with less Viibryd. No improvement in anxiety with gabapentin either. Still on buspirone for a long time without much change either. Plan: Wean Viibryd Dt NR. On 20 mg for 3 weeks Switch to sertraline 25 then 50 mg daily. Reduce gabapentin to 300 mg TID  05/14/21 appt noted: No SE.  Limited effect from sertraline 50 mg daily. Still anxious.   Wonders about whether should buspirone benefit. ? Effect.  Same dose for 3 years. Sleep fine with Ambien. Plan: Increase sertraline to 100 mg daily. OK continue AmBien 2.5 helps sleep Continue gabapentin 300 mg 3 times daily for gabapentin Continue buspirone 10 mg 3 times daily ( option wean it, or increase it.) Option wean buspirone once benefit of sertraline. Xanax 0.5 mg 1/2 twice daily as needed anxiety.  08/12/2021 appointment with the following noted: I might be a little bit better with increase sertraline to 100 but no  SE.  Willing to increase. Sleep [name] with Ambien.  Weaned down over the year.s No SE. Xanax average 3 times weekly.   No physical sx of anxiety usually. Asks about propranolol per card for palpitations.  Plan: Increase sertraline to 150 mg daily. OK continue AmBien 2.5 helps sleep Increase buspirone 20 mg 3 times daily to see if it could work better. Option wean buspirone once benefit of sertraline. Xanax 0.5 mg 1/2 twice  daily as needed anxiety.  10/22/21 appt noted: I think I have noticed a little bit better. SE mild sleepiness. Changed Sertraline 150 mg HS, buspirone 20 mg TID Still has anxiety and wonders if there are more options for her. Xanax really helps. [name] sleeper.  Exercise helps.  Walks in the AM Sees anxiety in one of 77 YO GD. Plan: Increase sertraline to 200 mg daily. OK continue AmBien 2.5 helps sleep Continue buspirone 20 mg 3 times daily tfor now . Option wean buspirone once benefit of sertraline. Xanax 0.5 mg 1/2 twice daily as needed anxiety.  12/24/2021 appointment with the following noted: Seeing some improvement with increase sertraline 200 mg daily. Wonders if med interactions might occur. Anxiety usually underlying consistent sort of thing.  With stress may take 1/2 of Xanax if feesl pressured with time, responsibilities.   Anxiety is clearly better about 60% with sertraline. No SE Plan: wean buspirone due to an adequate response Trial clonidine off label for anxiety 0.1 mg tablets 1/2 tablet at night for 3 nights, then 1 at night for 4 nights, then 1/2 in AM and 1 at night  03/05/2022 appointment with the following noted: Occ a little lightheaded and sleepy and dry mouth.  Has helped with stress.  [place] so up  and down so minor orthostasis.   Continues sertraline 200 mg daily Xanax 0.5 mg twice weekly prn anxiety. No avoidance bc anxiety.  Reduced enjoyment from anxiety mostly social things or demands. Anxiety went down from 8 to 6/10.   No panic.  Sleep really [name].  Some weight gain. 7-8 # A lot of stress with sister in [place] who had a significant stroke.    06/19/22 appt noted: Anxiety about the same. SE lethargic and sleepy and occ lightheaded esp if stands quickly at church.  Dry mouth. Sleep well at night.  Still walks in the AM. Think that makes me feel the best is Xanax but fears addiction. Not sleepy.  Plan: benefit clonidine off label for anxiety 0.1  mg tablets 1/2 in AM and 1 at night in the past but SE Wean it DT SE and replace with clonazepam 0.5 mg once or twice daily. Reduce clonidine to 1 at night for 1 week then 1/2 at night for 1 week then stop it.  08/04/2022 phone call concerned clonazepam may be affecting her balance.  He is taking 0.5 mg every morning and 0.25 mg nightly. MD response reduced clonazepam to 0.25 mg twice daily.  09/08/22 appt noted: Felt too dopey with clonazepam and kids noticed so stopped it too for a week. Currently sertraline 200 mg daily is only psych med except zolpidem 2.5 mg HS. Would rate anxiety as 7/10 severe. Wakes with it and mostly there all day.  Chronic anxiety all her life. Plan: Continue clonazepam and gabapentin.  Of the benzodiazepines lorazepam is the least likely to be sedating we will prescribe 0.5 mg twice daily as needed anxiety  12/02/2022 appointment noted: On sertraline 200 mg daily, gabapentin 300 mg 3  times daily, lorazepam 0.5 mg 3 times daily as needed anxiety, Ambien 5 mg nightly Taking lorazepam regularly 0.5 mg BID am and middle of the afternoon and not better than clonazepam with respect to cognition.   More forgetful and kids think it's the BZ. Didn't have a big effect on the anxiety.  Worry is primary sx of anxiety and had it all her life.   Generalized worry   2 sons responded to Wellbutrin and other on Zoloft with Wellbutrin and asks about it again Past Psychiatric History:  Therapy Linda [name]. prescriber Linda [name] & PCP Past Psychiatric Medication Trials:  Buspirone 10 TID NR but a little lightheaded  About 5 years ago tried citalopram briefly Zoloft 200, paxil, duloxetine, fluoxetine. Lexapro 15 sleepy, Viibryd 40 NR  Clonidine for anxiety SE  Gabapentin 300 TID for neuropathy for 2-3 years  Xanax., clonazepam 0.5 dopey, lorazepam 0.5 cog SE [facility].  2 drinks nightly  Review of Systems:  Review of Systems  Constitutional:  Negative for fatigue.   Cardiovascular:  Negative for chest pain and palpitations.  Neurological:  Negative for tremors.  Psychiatric/Behavioral:  The patient is nervous/anxious.     Medications: I have reviewed the patient's current medications.  Current Outpatient Medications  Medication Sig Dispense Refill   anastrozole (ARIMIDEX) 1 MG tablet TAKE 1 TABLET(1 MG) BY MOUTH DAILY 90 tablet 3   Ascorbic Acid (VITAMIN C) 1000 MG tablet Take 1,000 mg by mouth daily.     b complex vitamins tablet Take 1 tablet by mouth at bedtime.     calcium-vitamin D 250-100 MG-UNIT tablet Take 1 tablet by mouth 2 (two) times daily.     fluticasone (FLONASE) 50 MCG/ACT nasal spray      gabapentin (NEURONTIN) 300 MG capsule TAKE 1 CAPSULE(300 MG) BY MOUTH THREE TIMES DAILY 90 capsule 1   ibandronate (BONIVA) 150 MG tablet Take 150 mg by mouth every 30 (thirty) days. Take in the morning with a full glass of water, on an empty stomach, and do not take anything else by mouth or lie down for the next 30 min.     levocetirizine (XYZAL) 2.5 MG/5ML solution Take 5 mg by mouth every evening.     lidocaine (LIDODERM) 5 % Place 1 patch onto the skin daily. Remove & Discard patch within 12 hours or as directed by MD 14 patch 0   Multiple Vitamin (MULTIVITAMIN WITH MINERALS) TABS tablet Take 1 tablet by mouth at bedtime.     rosuvastatin (CRESTOR) 10 MG tablet Take 1 tablet (10 mg total) by mouth daily. (Patient taking differently: Take 10 mg by mouth at bedtime.)     oxyCODONE (ROXICODONE) 5 MG immediate release tablet Take 0.5 tablets (2.5 mg total) by mouth every 4 (four) hours as needed for severe pain. (Patient not taking: Reported on 12/02/2022) 20 tablet 0   sertraline (ZOLOFT) 100 MG tablet Take 2 tablets (200 mg total) by mouth daily. 180 tablet 1   No current facility-administered medications for this visit.    Medication Side Effects: None  Allergies:  Allergies  Allergen Reactions   Other Other (See Comments)    ? Seasonal  allergies - classic symptoms    Past Medical History:  Diagnosis Date   Anxiety    Arthritis    Atrophic vaginitis    Breast cancer ([place])    right (DCIS) s/p lumpectomy 7/15   Cataract    BILATERAL   Cervical dysplasia 1975   DCIS (ductal carcinoma in  situ)    RIGHT   Depression    Dysrhythmia    PVCs   Family history of adverse reaction to anesthesia    Pt stated that her mother had difficulty waking up after anesthesia   Family history of breast cancer    Family history of colon cancer    Family history of prostate cancer    Hx of radiation therapy 08/02/14- 08/24/14   right reast 4256 cGy i 16 sessions, no boost   Hypercholesteremia    Insomnia    Osteopenia 11/2011   T score -1.7 FRAX not calculated due to history of bisphosphonates and current Evista   Personal history of radiation therapy 2015   right side   PVC's (premature ventricular contractions)    PMH   Seasonal allergies    Skin cancer 2006   basal cell of face    Family History  Problem Relation Age of Onset   Breast cancer Mother        DIAGNOSED IN HER 80'S   Hyperlipidemia Mother    Osteoporosis Mother    Breast cancer Sister        DIAGNOSED AT AGE 60   Hypertension Maternal Grandmother    Breast cancer Maternal Grandmother        possibly breast cancer; had mastectomy in her 30s-40s, but don't know if it was due to cancer   Heart disease Maternal Grandfather    Stroke Maternal Grandfather    Diabetes Son        TYPE 1 DIABETES   Colon cancer Paternal Grandfather    Cancer Paternal Grandfather        colon   Heart failure Son    Kidney cancer Paternal Grandmother        bladder/kidney cancer   Prostate cancer Maternal Uncle     Social History   Socioeconomic History   Marital status: Married    Spouse name: Not on file   Number of children: Not on file   Years of education: Not on file   Highest education level: Not on file  Occupational History   Not on file  Tobacco Use    Smoking status: Former    Types: Cigarettes    Quit date: 05/19/1974    Years since quitting: 48.5   Smokeless tobacco: Never   Tobacco comments:    smoked in college  Vaping Use   Vaping Use: Never used  Substance and Sexual Activity   Alcohol use: Yes    Alcohol/week: 10.0 standard drinks of alcohol    Types: 10 Standard drinks or equivalent per week    Comment: daily ETOH cocktail nightly   Drug use: No   Sexual activity: Not Currently    Birth control/protection: Post-menopausal    Comment: G4 P4   Other Topics Concern   Not on file  Social History Narrative   Pt lives in [place] with spouse.  4 children and 8 grandchildren.   House wife   Social Determinants of Radio broadcast assistant Strain: Not on file  Food Insecurity: Not on file  Transportation Needs: Not on file  Physical Activity: Not on file  Stress: Not on file  Social Connections: Not on file  Intimate Partner Violence: Not on file    Past Medical History, Surgical history, Social history, and Family history were reviewed and updated as appropriate.   Please see review of systems for further details on the patient's review from today.   Objective:  Physical Exam:  There were no vitals taken for this visit.  Physical Exam Constitutional:      General: She is not in acute distress. Musculoskeletal:        General: No deformity.  Neurological:     Coordination: Coordination normal.  Psychiatric:        Attention and Perception: Attention normal. She does not perceive auditory or visual hallucinations.        Mood and Affect: Mood is anxious. Affect is not labile, blunt, angry, tearful or inappropriate.        Speech: Speech normal.        Behavior: Behavior normal.        Thought Content: Thought content normal. Thought content is not delusional. Thought content does not include homicidal or suicidal ideation.        Cognition and Memory: Cognition normal.        Judgment: Judgment normal.      Comments: Anxity better  but she wishes it were better.     Lab Review:     Component Value Date/Time   NA 140 10/01/2018 0628   NA 140 06/30/2017 0904   K 4.1 10/01/2018 0628   CL 110 10/01/2018 0628   CO2 25 10/01/2018 0628   GLUCOSE 90 10/01/2018 0628   BUN 17 10/01/2018 0628   BUN 23 06/30/2017 0904   CREATININE 0.94 10/01/2018 0628   CALCIUM 8.8 (L) 10/01/2018 0628   PROT 6.1 (L) 10/01/2018 0628   ALBUMIN 3.6 10/01/2018 0628   AST 23 10/01/2018 0628   ALT 18 10/01/2018 0628   ALKPHOS 31 (L) 10/01/2018 0628   BILITOT 0.6 10/01/2018 0628   GFRNONAA 59 (L) 10/01/2018 0628   GFRAA >60 10/01/2018 0628       Component Value Date/Time   WBC 5.1 09/26/2020 0942   RBC 4.10 09/26/2020 0942   HGB 13.7 09/26/2020 0942   HCT 42.4 09/26/2020 0942   PLT 231 09/26/2020 0942   MCV 103.4 (H) 09/26/2020 0942   MCH 33.4 09/26/2020 0942   MCHC 32.3 09/26/2020 0942   RDW 12.9 09/26/2020 0942   LYMPHSABS 1.6 06/08/2014 1330   MONOABS 0.6 06/08/2014 1330   EOSABS 0.2 06/08/2014 1330   BASOSABS 0.0 06/08/2014 1330   [employer]esight results: Serotonin transporter gene L/S, HT2R increase SE risk, 2D 6 ultra-rapid, 2 C19 intermediate  Assessment: Plan:    Linda [name] was seen today for follow-up and anxiety.  Diagnoses and all orders for this visit:  Generalized anxiety disorder -     sertraline (ZOLOFT) 100 MG tablet; Take 2 tablets (200 mg total) by mouth daily.  Insomnia due to mental condition    Greater than 50% of 30 min face to face time with patient was spent on counseling and coordination of care. We Discussed diagnosis and treatment plan.  Again.  Answered questions about the different mechanisms of different medications and their mechanisms specifically for anxiety including the sertraline, clonidine and Xanax..  Discussed the side effects at length. Educated about TRD   Overall she has had about 60% reduction in anxiety with sertraline 200 mg daily but like to have further  improvement if possible.  Intolerant BZ  Multiple med failures and intolerances.  Have tried all the safest meds reasonable to try.  Next category option would be atypical like olanzapine.  Unlikely to get enough benefit from other antidepressants for anxiety.  Genesight testing results disc at length again.  Paroxetine retrial would not be a [name] choice.  Did genetic testing 08/29/20 per her request..  Have reviewed the results with her in some detail.  We covered each of the normal and abnormal for pharmodynamic genes and pharmacokinetic genes as well as which meds are metabolized by which genes and how that is applied.  It was discussed that this is not the complete in total genetic information that is relevant to the use of psychiatric meds but it contains the currently available genetic information.  Option propranolol but not that [name] for GAD  continue sertraline to 200 mg daily. OK continue AmBien 2.5 helps sleep  Answered questions about Xanax  and BZ use. Worries over dependence and disc use up to 4 days/week. We discussed the short-term risks associated with benzodiazepines including sedation and increased fall risk among others.  Discussed long-term side effect risk including dependence, potential withdrawal symptoms, and the potential eventual dose-related risk of dementia.  But recent studies from 2020 dispute this association between benzodiazepines and dementia risk. Newer studies in 2020 do not support an association with dementia. She does exercise including balance issues.   Of the BZ lorazepam is usually one with least cognitive and balance , but having wean off DT Cog SE Reduce lorazepam to 0.25 mg BID for 1 week then stop it. No further BZ  DC BZ  Continue gabapentin back to 300 TID which she's taking for neuropathy. No sleeepiness it. Option increase but not a great med for anxiety Discussed side effects including dizziness in particular and balance trouble as well as  fatigue and potential for weight gain.  Continue counseling with Linda [name]  FU 4 mos  Lynder Parents, MD, DFAPA   Please see After Visit Summary for patient specific instructions.  Future Appointments  Date Time Provider [place]  12/26/2022  2:00 PM Lajuana Matte, MD [facility]  04/02/2023 10:30 AM Cottle, [employer] MD [facility] None  06/25/2023 11:30 AM Nicholas Lose, MD [facility] None    No orders of the defined types were placed in this encounter.    -------------------------------

## 2022-12-03 DIAGNOSIS — C801 Malignant (primary) neoplasm, unspecified: Secondary | ICD-10-CM | POA: Diagnosis not present

## 2022-12-03 DIAGNOSIS — J309 Allergic rhinitis, unspecified: Secondary | ICD-10-CM | POA: Diagnosis not present

## 2022-12-03 DIAGNOSIS — R051 Acute cough: Secondary | ICD-10-CM | POA: Diagnosis not present

## 2022-12-03 DIAGNOSIS — S2220XD Unspecified fracture of sternum, subsequent encounter for fracture with routine healing: Secondary | ICD-10-CM | POA: Diagnosis not present

## 2022-12-03 DIAGNOSIS — J069 Acute upper respiratory infection, unspecified: Secondary | ICD-10-CM | POA: Diagnosis not present

## 2022-12-09 DIAGNOSIS — F411 Generalized anxiety disorder: Secondary | ICD-10-CM | POA: Diagnosis not present

## 2022-12-22 ENCOUNTER — Other Ambulatory Visit: Payer: Self-pay | Admitting: Psychiatry

## 2022-12-22 DIAGNOSIS — F411 Generalized anxiety disorder: Secondary | ICD-10-CM

## 2022-12-26 ENCOUNTER — Ambulatory Visit (INDEPENDENT_AMBULATORY_CARE_PROVIDER_SITE_OTHER): Payer: PPO | Admitting: Thoracic Surgery (Cardiothoracic Vascular Surgery)

## 2022-12-26 DIAGNOSIS — S2220XA Unspecified fracture of sternum, initial encounter for closed fracture: Secondary | ICD-10-CM | POA: Diagnosis not present
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
## 2023-01-12 DIAGNOSIS — Z853 Personal history of malignant neoplasm of breast: Secondary | ICD-10-CM | POA: Diagnosis not present

## 2023-01-12 DIAGNOSIS — E785 Hyperlipidemia, unspecified: Secondary | ICD-10-CM | POA: Diagnosis not present

## 2023-01-12 DIAGNOSIS — Z9849 Cataract extraction status, unspecified eye: Secondary | ICD-10-CM | POA: Diagnosis not present

## 2023-01-12 DIAGNOSIS — M8580 Other specified disorders of bone density and structure, unspecified site: Secondary | ICD-10-CM | POA: Diagnosis not present

## 2023-01-12 DIAGNOSIS — G629 Polyneuropathy, unspecified: Secondary | ICD-10-CM | POA: Diagnosis not present

## 2023-01-12 DIAGNOSIS — F411 Generalized anxiety disorder: Secondary | ICD-10-CM | POA: Diagnosis not present

## 2023-01-12 DIAGNOSIS — M810 Age-related osteoporosis without current pathological fracture: Secondary | ICD-10-CM | POA: Diagnosis not present

## 2023-01-12 DIAGNOSIS — R32 Unspecified urinary incontinence: Secondary | ICD-10-CM | POA: Diagnosis not present

## 2023-01-12 DIAGNOSIS — D692 Other nonthrombocytopenic purpura: Secondary | ICD-10-CM | POA: Diagnosis not present

## 2023-01-13 DIAGNOSIS — F411 Generalized anxiety disorder: Secondary | ICD-10-CM | POA: Diagnosis not present

## 2023-01-16 DIAGNOSIS — D0511 Intraductal carcinoma in situ of right breast: Secondary | ICD-10-CM | POA: Diagnosis not present

## 2023-01-22 ENCOUNTER — Other Ambulatory Visit: Payer: Self-pay | Admitting: Internal Medicine

## 2023-01-22 DIAGNOSIS — Z1231 Encounter for screening mammogram for malignant neoplasm of breast: Secondary | ICD-10-CM

## 2023-02-10 DIAGNOSIS — F411 Generalized anxiety disorder: Secondary | ICD-10-CM | POA: Diagnosis not present

## 2023-03-03 ENCOUNTER — Other Ambulatory Visit: Payer: Self-pay | Admitting: Internal Medicine

## 2023-03-03 DIAGNOSIS — Z853 Personal history of malignant neoplasm of breast: Secondary | ICD-10-CM

## 2023-03-04 ENCOUNTER — Ambulatory Visit
Admission: RE | Admit: 2023-03-04 | Discharge: 2023-03-04 | Disposition: A | Discharge status: Home / Self Care | Payer: PPO | Source: Ambulatory Visit | Attending: Internal Medicine | Admitting: Internal Medicine

## 2023-03-04 DIAGNOSIS — Z853 Personal history of malignant neoplasm of breast: Secondary | ICD-10-CM | POA: Diagnosis not present

## 2023-03-04 DIAGNOSIS — R922 Inconclusive mammogram: Secondary | ICD-10-CM | POA: Diagnosis not present

## 2023-03-10 DIAGNOSIS — F411 Generalized anxiety disorder: Secondary | ICD-10-CM | POA: Diagnosis not present

## 2023-03-20 DIAGNOSIS — L905 Scar conditions and fibrosis of skin: Secondary | ICD-10-CM | POA: Diagnosis not present

## 2023-03-20 DIAGNOSIS — Z85828 Personal history of other malignant neoplasm of skin: Secondary | ICD-10-CM | POA: Diagnosis not present

## 2023-03-20 DIAGNOSIS — D692 Other nonthrombocytopenic purpura: Secondary | ICD-10-CM | POA: Diagnosis not present

## 2023-03-20 DIAGNOSIS — L814 Other melanin hyperpigmentation: Secondary | ICD-10-CM | POA: Diagnosis not present

## 2023-03-20 DIAGNOSIS — D2271 Melanocytic nevi of right lower limb, including hip: Secondary | ICD-10-CM | POA: Diagnosis not present

## 2023-03-20 DIAGNOSIS — L821 Other seborrheic keratosis: Secondary | ICD-10-CM | POA: Diagnosis not present

## 2023-03-24 ENCOUNTER — Other Ambulatory Visit (HOSPITAL_BASED_OUTPATIENT_CLINIC_OR_DEPARTMENT_OTHER): Payer: Self-pay

## 2023-03-24 ENCOUNTER — Encounter (HOSPITAL_BASED_OUTPATIENT_CLINIC_OR_DEPARTMENT_OTHER): Payer: Self-pay

## 2023-03-24 ENCOUNTER — Other Ambulatory Visit: Payer: Self-pay

## 2023-03-24 ENCOUNTER — Emergency Department (HOSPITAL_BASED_OUTPATIENT_CLINIC_OR_DEPARTMENT_OTHER)
Admission: EM | Admit: 2023-03-24 | Discharge: 2023-03-24 | Disposition: A | Discharge status: Home / Self Care | Payer: PPO | Attending: Emergency Medicine | Admitting: Emergency Medicine

## 2023-03-24 ENCOUNTER — Emergency Department (HOSPITAL_BASED_OUTPATIENT_CLINIC_OR_DEPARTMENT_OTHER): Payer: PPO | Admitting: Radiology

## 2023-03-24 DIAGNOSIS — S92811A Other fracture of right foot, initial encounter for closed fracture: Secondary | ICD-10-CM | POA: Diagnosis not present

## 2023-03-24 DIAGNOSIS — M79671 Pain in right foot: Secondary | ICD-10-CM | POA: Diagnosis not present

## 2023-03-24 DIAGNOSIS — Z853 Personal history of malignant neoplasm of breast: Secondary | ICD-10-CM | POA: Insufficient documentation

## 2023-03-24 NOTE — ED Provider Notes (Signed)
[place] EMERGENCY DEPARTMENT AT [facility] Provider Note   CSN: [id number] Arrival date & time: 03/24/23  0847     History  Chief Complaint  Patient presents with   Foot Pain    Shawnell Dykes is a 77 y.o. female.   Foot Pain   77 year old female presents emergency department complaints of right foot pain.  States the pain has been present over the past 2 to 3 weeks with acute worsening today.  States she was walking in a nearby park when she felt/heard a popping sensation in the lateral aspect of her right foot with subsequent pain.  Has had difficulty putting weight on foot since onset.  Denies any falls, rolling of the ankle or known trauma to the foot.  Denies history of surgeries on affected foot.  Denies any weakness or sensory deficits in affected foot.  Has taken no medication for this.  Past medical history significant for osteopenia, breast cancer, hypercholesterolemia,  Home Medications Prior to Admission medications   Medication Sig Start Date End Date Taking? Authorizing Provider  anastrozole (ARIMIDEX) 1 MG tablet TAKE 1 TABLET(1 MG) BY MOUTH DAILY 06/23/22   Serena Croissant, MD  Ascorbic Acid (VITAMIN C) 1000 MG tablet Take 1,000 mg by mouth daily.    [provider]  b complex vitamins tablet Take 1 tablet by mouth at bedtime.    [provider]  calcium-vitamin D 250-100 MG-UNIT tablet Take 1 tablet by mouth 2 (two) times daily. 09/24/20   Serena Croissant, MD  fluticasone (FLONASE) 50 MCG/ACT nasal spray     [provider]  gabapentin (NEURONTIN) 300 MG capsule TAKE 1 CAPSULE(300 MG) BY MOUTH THREE TIMES DAILY 12/01/22   Cottle, Steva Ready., MD  ibandronate (BONIVA) 150 MG tablet Take 150 mg by mouth every 30 (thirty) days. Take in the morning with a full glass of water, on an empty stomach, and do not take anything else by mouth or lie down for the next 30 min.    [provider]  levocetirizine (XYZAL) 2.5 MG/5ML  solution Take 5 mg by mouth every evening.    [provider]  lidocaine (LIDODERM) 5 % Place 1 patch onto the skin daily. Remove & Discard patch within 12 hours or as directed by MD 11/18/22   Rondel Baton, MD  Multiple Vitamin (MULTIVITAMIN WITH MINERALS) TABS tablet Take 1 tablet by mouth at bedtime.    [provider]  oxyCODONE (ROXICODONE) 5 MG immediate release tablet Take 0.5 tablets (2.5 mg total) by mouth every 4 (four) hours as needed for severe pain. Patient not taking: Reported on 12/02/2022 11/18/22   Rondel Baton, MD  rosuvastatin (CRESTOR) 10 MG tablet Take 1 tablet (10 mg total) by mouth daily. Patient taking differently: Take 10 mg by mouth at bedtime. 08/10/17   Serena Croissant, MD  sertraline (ZOLOFT) 100 MG tablet Take 2 tablets (200 mg total) by mouth daily. 12/02/22   Cottle, Steva Ready., MD      Allergies    Other    Review of Systems   Review of Systems  All other systems reviewed and are negative.   Physical Exam Updated Vital Signs BP 131/71 (BP Location: Right Arm)   Pulse 78   Resp 15   Ht 5\' 7"  (1.702 m)   Wt 68 kg   SpO2 95%   BMI 23.49 kg/m  Physical Exam Vitals and nursing note reviewed.  Constitutional:  General: She is not in acute distress.    Appearance: She is well-developed.  HENT:     Head: Normocephalic and atraumatic.  Eyes:     Conjunctiva/sclera: Conjunctivae normal.  Cardiovascular:     Rate and Rhythm: Normal rate and regular rhythm.  Pulmonary:     Effort: Pulmonary effort is normal. No respiratory distress.     Breath sounds: Normal breath sounds.  Abdominal:     Palpations: Abdomen is soft.     Tenderness: There is no abdominal tenderness.  Musculoskeletal:        General: No swelling.     Cervical back: Neck supple.     Comments: Patient with 2+ pedal and posterior tibial pulses.  Full range of motion of ankles, digits.  Tender to palpation to base of fifth metatarsal along sole.  No  tenderness to palpation medial or lateral malleolus.  No pain with range of motion.  No sensory deficits distally.  No overlying skin abnormalities appreciated.  Skin:    General: Skin is warm and dry.     Capillary Refill: Capillary refill takes less than 2 seconds.  Neurological:     Mental Status: She is alert.  Psychiatric:        Mood and Affect: Mood normal.     ED Results / Procedures / Treatments   Labs (all labs ordered are listed, but only abnormal results are displayed) Labs Reviewed - No data to display  EKG None  Radiology DG Foot Complete Right  Result Date: 03/24/2023 CLINICAL DATA:  Pain at base of fifth metatarsal after walking fast EXAM: RIGHT FOOT COMPLETE - 3+ VIEW COMPARISON:  03/25/2018 FINDINGS: Osseous demineralization. Joint spaces preserved. Corticated old ossicles noted at lateral foot. No acute fracture, dislocation, or bone destruction. Small plantar calcaneal spur. IMPRESSION: No acute osseous abnormalities. Electronically Signed   By: Ulyses Southward M.D.   On: 03/24/2023 09:35    Procedures Procedures    Medications Ordered in ED Medications - No data to display  ED Course/ Medical Decision Making/ A&P                             Medical Decision Making Amount and/or Complexity of Data Reviewed Radiology: ordered.   This patient presents to the ED for concern of foot pain, this involves an extensive number of treatment options, and is a complaint that carries with it a high risk of complications and morbidity.  The differential diagnosis includes fracture, dislocation, strain/sprain, Morton's neuroma, cellulitis, erysipelas, ischemic limb, DVT   Co morbidities that complicate the patient evaluation  See HPI   Additional history obtained:  Additional history obtained from EMR External records from outside source obtained and reviewed including hospital records   Lab Tests:  N/a   Imaging Studies ordered:  I ordered imaging  studies including right foot x-ray I independently visualized and interpreted imaging which showed no acute fracture or dislocation I agree with the radiologist interpretation   Cardiac Monitoring: / EKG:  The patient was maintained on a cardiac monitor.  I personally viewed and interpreted the cardiac monitored which showed an underlying rhythm of: Sinus rhythm   Consultations Obtained:  N/a   Problem List / ED Course / Critical interventions / Medication management  Right foot pain Reevaluation of the patient showed that the patient stayed the same I have reviewed the patients home medicines and have made adjustments as needed   Social Determinants  of Health:  Denies tobacco, illicit drug use   Test / Admission - Considered:  Right foot pain Vitals signs  within normal range and stable throughout visit. Imaging studies significant for: See above 77 year old female presents emergency department after development of worsening right foot pain.  X-ray without acute fracture or dislocation.  Patient without neurovascular compromise or appreciable weakness in affected ankle/foot.  Low clinical suspicion for cellulitis, erysipelas, septic arthritis, gout.  Symptoms most likely secondary to ankle sprain.  Shared decision made conversation was had regarding treatment of which patient elected for conservative therapy with cam walker boot.  Close follow-up with orthopedics recommended outpatient for reassessment of symptoms.  In meantime, symptomatic therapy recommended rest, ice, elevation, nonsteroidal medications.  Treatment plan discussed at length with patient and she acknowledged understanding was agreeable to said plan. Worrisome signs and symptoms were discussed with the patient, and the patient acknowledged understanding to return to the ED if noticed. Patient was stable upon discharge.          Final Clinical Impression(s) / ED Diagnoses Final diagnoses:  Right foot pain     Rx / DC Orders ED Discharge Orders     None         Peter Garter, [place] 03/24/23 1023    Ernie Avena, MD 03/24/23 1248

## 2023-03-24 NOTE — ED Triage Notes (Signed)
In for eval of right foot pain. Has been hurting for a couple of days and today while fast walking, felt a "pop". Pulse, motor, and sensation intact to right foot.

## 2023-03-24 NOTE — Discharge Instructions (Addendum)
As discussed, x-ray was negative for any fracture or dislocation.  Recommend treatment at home with rest, ice, elevation of affected foot.  You may take Motrin/Tylenol for pain/inflammation.  We have provided you with a boot on the emergency department to help aid in walking around.  Recommend follow-up with orthopedics for repeat assessment of your symptoms.  Please do not hesitate to return to emergency department if the worrisome signs and symptoms we discussed become apparent.

## 2023-03-25 DIAGNOSIS — M79671 Pain in right foot: Secondary | ICD-10-CM | POA: Diagnosis not present

## 2023-04-02 ENCOUNTER — Ambulatory Visit: Payer: PPO | Admitting: Psychiatry

## 2023-04-02 ENCOUNTER — Telehealth: Payer: Self-pay

## 2023-04-02 NOTE — Telephone Encounter (Signed)
Transition Care Management Follow-up Telephone Call Date of discharge and from where: 4/30 [facility]  How have you been since you were released from the hospital? ok Any questions or concerns? No  Items Reviewed: Did the pt receive and understand the discharge instructions provided? Yes  Medications obtained and verified? Yes  Other? No  Any new allergies since your discharge? No  Dietary orders reviewed? No Do you have support at home?       Follow up appointments reviewed:  PCP Hospital f/u appt confirmed? No  Scheduled to see  on  @ . Specialist Hospital f/u appt confirmed? Yes  Scheduled to see  on  @ . Are transportation arrangements needed? No  If their condition worsens, is the pt aware to call PCP or go to the Emergency Dept.? Yes Was the patient provided with contact information for the PCP's office or ED? Yes Was to pt encouraged to call back with questions or concerns? Yes

## 2023-04-03 ENCOUNTER — Telehealth: Payer: Self-pay

## 2023-04-03 DIAGNOSIS — M7671 Peroneal tendinitis, right leg: Secondary | ICD-10-CM | POA: Diagnosis not present

## 2023-04-03 NOTE — Telephone Encounter (Signed)
Transition Care Management Follow-up Telephone Call Date of discharge and from where: [facility] 4/30 How have you been since you were released from the hospital? ok Any questions or concerns? No  Items Reviewed: Did the pt receive and understand the discharge instructions provided? Yes  Medications obtained and verified? Yes  Other? No  Any new allergies since your discharge? No  Dietary orders reviewed? No Do you have support at home? Yes    Follow up appointments reviewed:  PCP Hospital f/u appt confirmed?     Scheduled to see  on  @ . Specialist Hospital f/u appt confirmed? Yes  Scheduled to see Orthopedic on 5/10 @ . Are transportation arrangements needed? No If their condition worsens, is the pt aware to call PCP or go to the Emergency Dept.? Yes Was the patient provided with contact information for the PCP's office or ED? Yes Was to pt encouraged to call back with questions or concerns? Yes

## 2023-04-07 DIAGNOSIS — F411 Generalized anxiety disorder: Secondary | ICD-10-CM | POA: Diagnosis not present

## 2023-04-23 DIAGNOSIS — M25571 Pain in right ankle and joints of right foot: Secondary | ICD-10-CM | POA: Diagnosis not present

## 2023-05-07 DIAGNOSIS — M25571 Pain in right ankle and joints of right foot: Secondary | ICD-10-CM | POA: Diagnosis not present

## 2023-05-08 DIAGNOSIS — F411 Generalized anxiety disorder: Secondary | ICD-10-CM | POA: Diagnosis not present

## 2023-05-11 DIAGNOSIS — M25571 Pain in right ankle and joints of right foot: Secondary | ICD-10-CM | POA: Diagnosis not present

## 2023-05-15 DIAGNOSIS — M7671 Peroneal tendinitis, right leg: Secondary | ICD-10-CM | POA: Diagnosis not present

## 2023-06-08 DIAGNOSIS — F411 Generalized anxiety disorder: Secondary | ICD-10-CM | POA: Diagnosis not present

## 2023-06-16 DIAGNOSIS — F411 Generalized anxiety disorder: Secondary | ICD-10-CM | POA: Diagnosis not present

## 2023-06-25 ENCOUNTER — Inpatient Hospital Stay: Payer: PPO | Attending: Hematology and Oncology | Admitting: Hematology and Oncology

## 2023-06-25 NOTE — Assessment & Plan Note (Deleted)
01/2014: Right breast DCIS ER/PR positive 7 mm focus on MRI  06/13/2014 Rt lumpectomy  2 mm focus of DCIS negative margins, ER 70%, PR 80% positive status post radiation therapy;  05/07/2017:Left lumpectomy: Complex sclerosing lesion with usual ductal hyperplasia, focal atypical lobular hyperplasia, no invasive cancer identified Adjuvant radiation therapy   Prior treatment: Tamoxifen 20 mg daily 2015- 2020   Osteopenia: Was previously on Boniva.  Not taking it any further. Calcium and vitamin D Bone density 10 2015: T score -1.8.  Bone Density July 2020: T score -2   Biopsy 09/04/2020: Right breast medial: IDC with DCIS grade 2, ER greater than 95%, PR 30%, Ki-67 20% HER-2 not done because of insufficient tissue; right breast LOQ: DCIS   10/12/2020:Right lumpectomy Dwain Sarna): intermediate grade DCIS, clear margins, 1 right axillary lymph node negative for carcinoma treatment  .    Anastrozole Toxicities: Tolerating it well without any problems or concerns.  Denies any hot flashes or myalgias.    Breast Cancer Surveillance:  Mammograms 03/04/2023: Benign breast density category C breast MRIs 09/01/2022: Benign breast density category C Bone density 07/02/2021: T score -1.8: Osteopenia colon cancer calcium vitamin D and weightbearing exercises  Breast exam 06/25/2023: Benign   Return to clinic in 1 year for follow-up.

## 2023-06-26 ENCOUNTER — Other Ambulatory Visit: Payer: Self-pay | Admitting: Hematology and Oncology

## 2023-07-07 DIAGNOSIS — F411 Generalized anxiety disorder: Secondary | ICD-10-CM | POA: Diagnosis not present

## 2023-07-08 DIAGNOSIS — H04123 Dry eye syndrome of bilateral lacrimal glands: Secondary | ICD-10-CM | POA: Diagnosis not present

## 2023-07-08 DIAGNOSIS — H35371 Puckering of macula, right eye: Secondary | ICD-10-CM | POA: Diagnosis not present

## 2023-07-08 DIAGNOSIS — Z961 Presence of intraocular lens: Secondary | ICD-10-CM | POA: Diagnosis not present

## 2023-07-08 DIAGNOSIS — T8522XS Displacement of intraocular lens, sequela: Secondary | ICD-10-CM | POA: Diagnosis not present

## 2023-07-10 ENCOUNTER — Other Ambulatory Visit: Payer: Self-pay | Admitting: Internal Medicine

## 2023-07-10 DIAGNOSIS — M8588 Other specified disorders of bone density and structure, other site: Secondary | ICD-10-CM

## 2023-08-17 DIAGNOSIS — M8580 Other specified disorders of bone density and structure, unspecified site: Secondary | ICD-10-CM | POA: Diagnosis not present

## 2023-08-17 DIAGNOSIS — R7989 Other specified abnormal findings of blood chemistry: Secondary | ICD-10-CM | POA: Diagnosis not present

## 2023-08-17 DIAGNOSIS — E785 Hyperlipidemia, unspecified: Secondary | ICD-10-CM | POA: Diagnosis not present

## 2023-08-18 DIAGNOSIS — F411 Generalized anxiety disorder: Secondary | ICD-10-CM | POA: Diagnosis not present

## 2023-08-20 DIAGNOSIS — R7301 Impaired fasting glucose: Secondary | ICD-10-CM | POA: Diagnosis not present

## 2023-08-20 DIAGNOSIS — Z1339 Encounter for screening examination for other mental health and behavioral disorders: Secondary | ICD-10-CM | POA: Diagnosis not present

## 2023-08-20 DIAGNOSIS — G4700 Insomnia, unspecified: Secondary | ICD-10-CM | POA: Diagnosis not present

## 2023-08-20 DIAGNOSIS — C801 Malignant (primary) neoplasm, unspecified: Secondary | ICD-10-CM | POA: Diagnosis not present

## 2023-08-20 DIAGNOSIS — E785 Hyperlipidemia, unspecified: Secondary | ICD-10-CM | POA: Diagnosis not present

## 2023-08-20 DIAGNOSIS — G629 Polyneuropathy, unspecified: Secondary | ICD-10-CM | POA: Diagnosis not present

## 2023-08-20 DIAGNOSIS — F1320 Sedative, hypnotic or anxiolytic dependence, uncomplicated: Secondary | ICD-10-CM | POA: Diagnosis not present

## 2023-08-20 DIAGNOSIS — R82998 Other abnormal findings in urine: Secondary | ICD-10-CM | POA: Diagnosis not present

## 2023-08-20 DIAGNOSIS — M8580 Other specified disorders of bone density and structure, unspecified site: Secondary | ICD-10-CM | POA: Diagnosis not present

## 2023-08-20 DIAGNOSIS — D692 Other nonthrombocytopenic purpura: Secondary | ICD-10-CM | POA: Diagnosis not present

## 2023-08-20 DIAGNOSIS — Z1331 Encounter for screening for depression: Secondary | ICD-10-CM | POA: Diagnosis not present

## 2023-08-20 DIAGNOSIS — Z23 Encounter for immunization: Secondary | ICD-10-CM | POA: Diagnosis not present

## 2023-08-20 DIAGNOSIS — Z0000 Encounter for general adult medical examination without abnormal findings: Secondary | ICD-10-CM | POA: Diagnosis not present

## 2023-08-20 DIAGNOSIS — F411 Generalized anxiety disorder: Secondary | ICD-10-CM | POA: Diagnosis not present

## 2023-08-24 ENCOUNTER — Ambulatory Visit: Payer: PPO | Admitting: Obstetrics and Gynecology

## 2023-09-15 DIAGNOSIS — F411 Generalized anxiety disorder: Secondary | ICD-10-CM | POA: Diagnosis not present

## 2023-09-21 NOTE — Progress Notes (Signed)
77 y.o. [id number] Married Caucasian female here for breast and pelvic exam.    Hx right breast DCIS. Patient alternates mammogram and breast MRI every 6 months.  She is due for her breast MRI.   Not sexually active.   Taking Boniva through PCP.  Has 9 grandchildren. Everyone is going to [employer] for Thanksgiving.   PCP: Cleatis Polka., MD   No LMP recorded. Patient is postmenopausal.           Sexually active: No.  The current method of family planning is post menopausal status.    Exercising: Yes.     walking Smoker:  former  OB History  Gravida Para Term Preterm AB Living  4 4 4     4   SAB IAB Ectopic Multiple Live Births               # Outcome Date GA Lbr Len/2nd Weight Sex Type Anes PTL Lv  4 Term           3 Term           2 Term           1 Term              Health Maintenance: Pap:  05/29/21 neg: HR HPV neg, 12/04/11 WNL History of abnormal Pap:  remote  Hx of cryotherapy 40 years ago.   Pap 01/03/14 at outside office:  ASUS-H + endometrial cells.  Colposcopy negative.  MMG: 03/04/23 Breast Density Cat C, BI-RADS CAT 2 benign Colonoscopy:   HM Colonoscopy          Discontinued - Colonoscopy  Discontinued      Frequency changed to Never automatically (Topic No Longer Applies)   05/10/2019  Outside Claim: PR COLONOSCOPY W/BIOPSY SINGLE/MULTIPLE   Only the first 1 history entries have been loaded, but more history exists.           BMD:  07/02/21  Result  osteopenic  HIV: n/a Hep C: n/a  Immunization History  Administered Date(s) Administered   PFIZER(Purple Top)SARS-COV-2 Vaccination 12/16/2019, 01/06/2020      reports that she quit smoking about 49 years ago. Her smoking use included cigarettes. She has never used smokeless tobacco. She reports current alcohol use of about 10.0 standard drinks of alcohol per week. She reports that she does not use drugs.  Past Medical History:  Diagnosis Date   Anxiety    Arthritis    Atrophic vaginitis     Breast cancer (HCC)    right (DCIS) s/p lumpectomy 7/15   Cataract    BILATERAL   Cervical dysplasia 1975   DCIS (ductal carcinoma in situ)    RIGHT   Depression    Dysrhythmia    PVCs   Family history of adverse reaction to anesthesia    Pt stated that her mother had difficulty waking up after anesthesia   Family history of breast cancer    Family history of colon cancer    Family history of prostate cancer    Hx of radiation therapy 08/02/14- 08/24/14   right reast 4256 cGy i 16 sessions, no boost   Hypercholesteremia    Insomnia    Osteopenia 11/2011   T score -1.7 FRAX not calculated due to history of bisphosphonates and current Evista   Personal history of radiation therapy 2015   right side   PVC's (premature ventricular contractions)    PMH   Seasonal allergies    Skin  cancer 2006   basal cell of face    Past Surgical History:  Procedure Laterality Date   APPENDECTOMY  1955   BREAST BIOPSY  2010   rt bx   BREAST BIOPSY  2009   rt   BREAST EXCISIONAL BIOPSY Left 2018   ALH & COMPLEX SCLEROSING LESION   BREAST LUMPECTOMY  1983   BENIGN-rt   BREAST LUMPECTOMY Right 2015   dcis   BREAST LUMPECTOMY WITH RADIOACTIVE SEED AND SENTINEL LYMPH NODE BIOPSY Right 10/12/2020   Procedure: RIGHT BREAST LUMPECTOMY X 2 WITH RADIOACTIVE SEED AND SENTINEL LYMPH NODE BIOPSY;  Surgeon: Emelia Loron, MD;  Location: [facility];  Service: General;  Laterality: Right;   BREAST LUMPECTOMY WITH RADIOACTIVE SEED LOCALIZATION Right 06/13/2014   Procedure: RIGHT BREAST  RADIOACTIVE SEED GUIDED LUMPECTOMY;  Surgeon: Emelia Loron, MD;  Location: [facility];  Service: General;  Laterality: Right;for DCIS   CATARACT EXTRACTION     Both eyes   cataract surgery  2005   COLONOSCOPY     COLPOSCOPY     EXCISION OF BASAL CELL CA FROM [facility]  2006   FACELIFT  2014   FACIAL COSMETIC SURGERY     GYNECOLOGIC CRYOSURGERY     KNEE SURGERY  2013   Arthroscopic-rt    RADIOACTIVE SEED GUIDED EXCISIONAL BREAST BIOPSY Left 05/07/2017   Procedure: LEFT RADIOACTIVE SEED GUIDED EXCISIONAL BREAST BIOPSY;  Surgeon: Emelia Loron, MD;  Location: [facility];  Service: General;  Laterality: Left;   REPOSITION OF LENS Right 10/01/2018   Procedure: REPOSITION OF LENS RIGHT EYE;  Surgeon: Carmela Rima, MD;  Location: [facility] OR;  Service: Ophthalmology;  Laterality: Right;    Current Outpatient Medications  Medication Sig Dispense Refill   anastrozole (ARIMIDEX) 1 MG tablet TAKE 1 TABLET(1 MG) BY MOUTH DAILY 90 tablet 3   ARIPiprazole (ABILIFY) 2 MG tablet Take 1 tablet every day by oral route.     Ascorbic Acid (VITAMIN C) 1000 MG tablet Take 1,000 mg by mouth daily.     b complex vitamins tablet Take 1 tablet by mouth at bedtime.     calcium-vitamin D 250-100 MG-UNIT tablet Take 1 tablet by mouth 2 (two) times daily.     fluticasone (FLONASE) 50 MCG/ACT nasal spray      gabapentin (NEURONTIN) 300 MG capsule TAKE 1 CAPSULE(300 MG) BY MOUTH THREE TIMES DAILY (Patient taking differently: 600 mg 3 (three) times daily.) 90 capsule 1   ibandronate (BONIVA) 150 MG tablet Take 150 mg by mouth every 30 (thirty) days. Take in the morning with a full glass of water, on an empty stomach, and do not take anything else by mouth or lie down for the next 30 min.     levocetirizine (XYZAL) 2.5 MG/5ML solution Take 5 mg by mouth every evening.     Multiple Vitamin (MULTIVITAMIN WITH MINERALS) TABS tablet Take 1 tablet by mouth at bedtime.     MYRBETRIQ 25 MG TB24 tablet 1 tablet Orally Once a day for 90 days     rosuvastatin (CRESTOR) 10 MG tablet Take 1 tablet (10 mg total) by mouth daily. (Patient taking differently: Take 10 mg by mouth at bedtime.)     sertraline (ZOLOFT) 100 MG tablet Take 2 tablets (200 mg total) by mouth daily. 180 tablet 1   zolpidem (AMBIEN) 5 MG tablet Take 5 mg by mouth at bedtime as needed for sleep.     lidocaine (LIDODERM) 5 % Place 1  patch  onto the skin daily. Remove & Discard patch within 12 hours or as directed by MD (Patient not taking: Reported on 10/05/2023) 14 patch 0   No current facility-administered medications for this visit.    Family History  Problem Relation Age of Onset   Breast cancer Mother        DIAGNOSED IN HER 60'S   Hyperlipidemia Mother    Osteoporosis Mother    Breast cancer Sister        DIAGNOSED AT AGE 86   Hypertension Maternal Grandmother    Breast cancer Maternal Grandmother        possibly breast cancer; had mastectomy in her 30s-40s, but don't know if it was due to cancer   Heart disease Maternal Grandfather    Stroke Maternal Grandfather    Diabetes Son        TYPE 1 DIABETES   Colon cancer Paternal Grandfather    Cancer Paternal Grandfather        colon   Heart failure Son    Kidney cancer Paternal Grandmother        bladder/kidney cancer   Prostate cancer Maternal Uncle     Review of Systems  All other systems reviewed and are negative.   Exam:   BP 116/64 (BP Location: Left Arm, Patient Position: Sitting, Cuff Size: Normal)   Pulse 68   Ht 5\' 6"  (1.676 m)   Wt 154 lb (69.9 kg)   SpO2 95%   BMI 24.86 kg/m     General appearance: alert, cooperative and appears stated age Head: normocephalic, without obvious abnormality, atraumatic Neck: no adenopathy, supple, symmetrical, trachea midline and thyroid normal to inspection and palpation Lungs: clear to auscultation bilaterally Breasts: scars noted, right nipple inversion (old change), no masses or tenderness, No nipple retraction or dimpling, No nipple discharge or bleeding, No axillary adenopathy Heart: regular rate and rhythm Abdomen: soft, non-tender; no masses, no organomegaly Extremities: extremities normal, atraumatic, no cyanosis or edema Skin: skin color, texture, turgor normal. No rashes or lesions Lymph nodes: cervical, supraclavicular, and axillary nodes normal. Neurologic: grossly normal  Pelvic: External  genitalia:  no lesions              No abnormal inguinal nodes palpated.              Urethra:  normal appearing urethra with no masses, tenderness or lesions              Bartholins and Skenes: normal                 Vagina: normal appearing vagina with normal color and discharge, no lesions              Cervix: no lesions              Pap taken:  no Bimanual Exam:  Uterus:  normal size, contour, position, consistency, mobility, non-tender              Adnexa: no mass, fullness, tenderness              Rectal exam: yes.  Confirms.              Anus:  normal sphincter tone, no lesions  Chaperone was present for exam:  Warren Lacy, CMA   Assessment:   Right breast cancer.   Negative genetic testing.  FH breast cancer mother and sister.  Right nipple inversion.  Remote hx Hx ACUS-H pap 2015.  Negative colposcopy.  Osteopenia. On Boniva.   Plan:  Mammogram screening discussed. Self breast awareness reviewed. She will follow up with Dr. Pamelia Hoit and get her breast MRI done.  Guidelines for Calcium, Vitamin D, regular exercise program including cardiovascular and weight bearing exercise. Pap and HR HPV in 2027.  BMD due.  Fu here in 2 years and prn.  20 min  total time was spent for this patient encounter, including preparation, face-to-face counseling with the patient, coordination of care, and documentation of the encounter in addition to doing the breast and pelvic exam.

## 2023-10-05 ENCOUNTER — Ambulatory Visit (INDEPENDENT_AMBULATORY_CARE_PROVIDER_SITE_OTHER): Payer: PPO | Admitting: Obstetrics and Gynecology

## 2023-10-05 ENCOUNTER — Encounter: Payer: Self-pay | Admitting: Obstetrics and Gynecology

## 2023-10-05 VITALS — BP 116/64 | HR 68 | Ht 66.0 in | Wt 154.0 lb

## 2023-10-05 DIAGNOSIS — Z01419 Encounter for gynecological examination (general) (routine) without abnormal findings: Secondary | ICD-10-CM

## 2023-10-05 DIAGNOSIS — Z853 Personal history of malignant neoplasm of breast: Secondary | ICD-10-CM

## 2023-10-05 DIAGNOSIS — Z9289 Personal history of other medical treatment: Secondary | ICD-10-CM

## 2023-10-05 DIAGNOSIS — Z9189 Other specified personal risk factors, not elsewhere classified: Secondary | ICD-10-CM | POA: Diagnosis not present

## 2023-10-05 DIAGNOSIS — Z8742 Personal history of other diseases of the female genital tract: Secondary | ICD-10-CM | POA: Diagnosis not present

## 2023-10-05 DIAGNOSIS — M8580 Other specified disorders of bone density and structure, unspecified site: Secondary | ICD-10-CM

## 2023-10-05 DIAGNOSIS — Z79818 Long term (current) use of other agents affecting estrogen receptors and estrogen levels: Secondary | ICD-10-CM

## 2023-10-05 NOTE — Patient Instructions (Signed)

## 2023-10-13 ENCOUNTER — Telehealth: Payer: Self-pay

## 2023-10-13 ENCOUNTER — Other Ambulatory Visit: Payer: Self-pay

## 2023-10-13 DIAGNOSIS — F4323 Adjustment disorder with mixed anxiety and depressed mood: Secondary | ICD-10-CM | POA: Diagnosis not present

## 2023-10-13 DIAGNOSIS — Z170 Estrogen receptor positive status [ER+]: Secondary | ICD-10-CM

## 2023-10-13 NOTE — Telephone Encounter (Signed)
Pt called and LVM asking for MRI order. Pt was r/s to see MD per her request. She was originally scheduled to see MD in July 2024 where he would have ordered MRI. Per MD, orders placed for MRI within one week because she has MD f/u 11/03/23, and diag MM for April 2025. Attempted to call pt. LVM for call back.

## 2023-10-13 NOTE — Telephone Encounter (Signed)
Pt returned call. She understands an email was sent to [facility] to facilitate scheduling her MRI. If she is not able to have MRI by 10/27/23 she will need to r/s her appt with Dr Pamelia Hoit for one week later. Pt understands [facility] schedulers will be in touch and she will call us back to let us know when it is scheduled so we may plan accordingly.

## 2023-10-26 ENCOUNTER — Ambulatory Visit
Admission: RE | Admit: 2023-10-26 | Discharge: 2023-10-26 | Disposition: A | Discharge status: Home / Self Care | Payer: PPO | Source: Ambulatory Visit | Attending: Hematology and Oncology

## 2023-10-26 DIAGNOSIS — Z853 Personal history of malignant neoplasm of breast: Secondary | ICD-10-CM | POA: Diagnosis not present

## 2023-10-26 DIAGNOSIS — Z170 Estrogen receptor positive status [ER+]: Secondary | ICD-10-CM

## 2023-10-26 MED ORDER — GADOPICLENOL 0.5 MMOL/ML IV SOLN
7.0000 mL | Freq: Once | INTRAVENOUS | Status: AC | PRN
  Administered 2023-10-26: 7 mL via INTRAVENOUS

## 2023-11-03 ENCOUNTER — Inpatient Hospital Stay: Payer: PPO | Attending: Hematology and Oncology | Admitting: Hematology and Oncology

## 2023-11-03 VITALS — BP 107/52 | HR 79 | Temp 97.6°F | Resp 18 | Ht 66.0 in | Wt 155.7 lb

## 2023-11-03 DIAGNOSIS — Z923 Personal history of irradiation: Secondary | ICD-10-CM | POA: Diagnosis not present

## 2023-11-03 DIAGNOSIS — Z79810 Long term (current) use of selective estrogen receptor modulators (SERMs): Secondary | ICD-10-CM | POA: Insufficient documentation

## 2023-11-03 DIAGNOSIS — Z170 Estrogen receptor positive status [ER+]: Secondary | ICD-10-CM | POA: Diagnosis not present

## 2023-11-03 DIAGNOSIS — C50311 Malignant neoplasm of lower-inner quadrant of right female breast: Secondary | ICD-10-CM | POA: Insufficient documentation

## 2023-11-03 DIAGNOSIS — M8580 Other specified disorders of bone density and structure, unspecified site: Secondary | ICD-10-CM | POA: Diagnosis not present

## 2023-11-03 NOTE — Progress Notes (Signed)
Patient Care Team: Cleatis Polka., MD as PCP - General (Internal Medicine) Hillis Range, MD (Inactive) as PCP - Cardiology (Cardiology) Hillis Range, MD (Inactive) as PCP - Electrophysiology (Cardiology) Pershing Proud, RN as Oncology Nurse Navigator Donnelly Angelica, RN as Oncology Nurse Navigator Patton Salles, MD as Consulting Physician (Obstetrics and Gynecology)  DIAGNOSIS:  Encounter Diagnosis  Name Primary?   Malignant neoplasm of lower-inner quadrant of right breast of female, estrogen receptor positive (HCC) Yes    SUMMARY OF ONCOLOGIC HISTORY: Oncology History  Malignant neoplasm of lower-inner quadrant of right female breast (HCC)  02/03/2014 Breast MRI   7 X 6X 5 mm right breast abnormality as 2 immediately adjacent 3 mm foci of enhancement   05/19/2014 Initial Diagnosis   Malignant neoplasm of lower-inner quadrant of female breast DCIS   06/13/2014 Surgery   R Lumpectomy DCIS 2 mm size Grade 1 Er 70%, PR 80%, Margins Neg   07/25/2014 - 08/25/2014 Radiation Therapy   Adjuvant radiation therapy   08/10/2014 - 06/02/2019 Anti-estrogen oral therapy   Tamoxifen 20 mg daily x5 years   10/18/2014 Procedure   genetic testing was normal no mutations identified   05/07/2017 Surgery   Left lumpectomy: Complex sclerosing lesion with usual ductal hyperplasia, focal atypical lobular hyperplasia, no invasive cancer identified   08/11/2018 Breast MRI   Persistent non-mass enhancement within the left LOQ at anterior depth, at the site of patient's earlier MRI-guided biopsy revealing complex sclerosing lesion, atypical lobular hyperplasia and pseudoangiomatous stromal hyperplasia. This non-mass enhancement is not significantly changed in extent compared to the previous MRI of 02/05/2017 (pre biopsy). No evidence of malignancy within right breast.      09/04/2020 Relapse/Recurrence   Right breast biopsy medial: IDC with DCIS, grade 2, ER greater than 95%, PR 30%,  Ki-67 20%, insufficient tumor for HER-2 testing   10/12/2020 Surgery   Right lumpectomy Dwain Sarna): intermediate grade DCIS, clear margins, 1 right axillary lymph node negative for carcinoma.   10/24/2020 -  Anti-estrogen oral therapy   Anastrozole     CHIEF COMPLIANT: Follow-up on anastrozole therapy  HISTORY OF PRESENT ILLNESS:  History of Present Illness   The patient, with a history of breast cancer, presents with recent weight gain of approximately ten pounds over the past six months. She has been on anastrozole for three years and recently started Abilify. She is concerned that the weight gain may be a side effect of her medication. She also expresses interest in starting metformin for weight loss, as she has observed weight loss in her husband who takes it for type 2 diabetes.  In addition to the weight gain, the patient has osteopenia and is concerned about the potential for anastrozole to decrease her bone density. She has a bone density test scheduled in February.  The patient also mentions a recent hiking expedition to [employer] [facility] [place], which she completed last month. She lost twenty pounds in preparation for the hike and has been trying to maintain a healthy lifestyle.         ALLERGIES:  is allergic to other.  MEDICATIONS:  Current Outpatient Medications  Medication Sig Dispense Refill   anastrozole (ARIMIDEX) 1 MG tablet TAKE 1 TABLET(1 MG) BY MOUTH DAILY 90 tablet 3   ARIPiprazole (ABILIFY) 2 MG tablet Take 1 tablet every day by oral route.     Ascorbic Acid (VITAMIN C) 1000 MG tablet Take 1,000 mg by mouth daily.  b complex vitamins tablet Take 1 tablet by mouth at bedtime.     calcium-vitamin D 250-100 MG-UNIT tablet Take 1 tablet by mouth 2 (two) times daily.     fluticasone (FLONASE) 50 MCG/ACT nasal spray      gabapentin (NEURONTIN) 300 MG capsule TAKE 1 CAPSULE(300 MG) BY MOUTH THREE TIMES DAILY (Patient taking differently: 600 mg 3 (three) times  daily.) 90 capsule 1   ibandronate (BONIVA) 150 MG tablet Take 150 mg by mouth every 30 (thirty) days. Take in the morning with a full glass of water, on an empty stomach, and do not take anything else by mouth or lie down for the next 30 min.     Multiple Vitamin (MULTIVITAMIN WITH MINERALS) TABS tablet Take 1 tablet by mouth at bedtime.     MYRBETRIQ 25 MG TB24 tablet 1 tablet Orally Once a day for 90 days     rosuvastatin (CRESTOR) 10 MG tablet Take 1 tablet (10 mg total) by mouth daily. (Patient taking differently: Take 10 mg by mouth at bedtime.)     sertraline (ZOLOFT) 100 MG tablet Take 2 tablets (200 mg total) by mouth daily. 180 tablet 1   zolpidem (AMBIEN) 5 MG tablet Take 5 mg by mouth at bedtime as needed for sleep.     No current facility-administered medications for this visit.    PHYSICAL EXAMINATION: ECOG PERFORMANCE STATUS: 1 - Symptomatic but completely ambulatory  Vitals:   11/03/23 1447  BP: (!) 107/52  Pulse: 79  Resp: 18  Temp: 97.6 F (36.4 C)  SpO2: 94%   Filed Weights   11/03/23 1447  Weight: 155 lb 11.2 oz (70.6 kg)    Physical Exam   MEASUREMENTS: Weight loss of 20 pounds. BREAST: Hair present, no abnormalities noted. SKIN: Scar tissue present, no other abnormalities noted.      (exam performed in the presence of a chaperone)  LABORATORY DATA:  I have reviewed the data as listed    Latest Ref Rng & Units 10/01/2018    6:28 AM 08/11/2018    3:39 PM 06/30/2017    9:04 AM  CMP  Glucose 70 - 99 mg/dL 90   92   BUN 8 - 23 mg/dL 17   23   Creatinine 4.09 - 1.00 mg/dL 8.11  9.14  7.82   Sodium 135 - 145 mmol/L 140   140   Potassium 3.5 - 5.1 mmol/L 4.1   4.7   Chloride 98 - 111 mmol/L 110   103   CO2 22 - 32 mmol/L 25   24   Calcium 8.9 - 10.3 mg/dL 8.8   9.6   Total Protein 6.5 - 8.1 g/dL 6.1     Total Bilirubin 0.3 - 1.2 mg/dL 0.6     Alkaline Phos 38 - 126 U/L 31     AST 15 - 41 U/L 23     ALT 0 - 44 U/L 18       Lab Results  Component  Value Date   WBC 5.1 09/26/2020   HGB 13.7 09/26/2020   HCT 42.4 09/26/2020   MCV 103.4 (H) 09/26/2020   PLT 231 09/26/2020   NEUTROABS 4.1 06/08/2014    ASSESSMENT & PLAN:  Malignant neoplasm of lower-inner quadrant of right female breast 01/2014: Right breast DCIS ER/PR positive 7 mm focus on MRI  06/13/2014 Rt lumpectomy  2 mm focus of DCIS negative margins, ER 70%, PR 80% positive status post radiation therapy;  05/07/2017:Left lumpectomy: Complex  sclerosing lesion with usual ductal hyperplasia, focal atypical lobular hyperplasia, no invasive cancer identified Adjuvant radiation therapy   Prior treatment: Tamoxifen 20 mg daily 2015- 2020   Osteopenia: Was previously on Boniva.  Not taking it any further. Calcium and vitamin D Bone density 10 2015: T score -1.8.  Bone Density July 2020: T score -2   Biopsy 09/04/2020: Right breast medial: IDC with DCIS grade 2, ER greater than 95%, PR 30%, Ki-67 20% HER-2 not done because of insufficient tissue; right breast LOQ: DCIS   10/12/2020:Right lumpectomy Dwain Sarna): intermediate grade DCIS, clear margins, 1 right axillary lymph node negative for carcinoma treatment  .    Anastrozole Toxicities: Tolerating it well without any problems or concerns.  Denies any hot flashes or myalgias.    Breast Cancer Surveillance:  Mammograms 03/04/2023: Benign breast density category C breast MRIs 10/27/2023: Benign breast density category B Bone density 07/02/2021: T score -1.8: Osteopenia colon cancer calcium vitamin D and weightbearing exercises    Return to clinic in 1 year for follow-up.     Orders Placed This Encounter  Procedures   MR BREAST BILATERAL W WO CONTRAST INC CAD    Standing Status:   Future    Standing Expiration Date:   11/02/2024    Order Specific Question:   If indicated for the ordered procedure, I authorize the administration of contrast media per Radiology protocol    Answer:   Yes    Order Specific Question:   What is the  patient's sedation requirement?    Answer:   No Sedation    Order Specific Question:   Does the patient have a pacemaker or implanted devices?    Answer:   No    Order Specific Question:   Preferred imaging location?    Answer:   [facility] (table limit-550lbs)    Order Specific Question:   Release to patient    Answer:   Immediate   The patient has a good understanding of the overall plan. she agrees with it. she will call with any problems that may develop before the next visit here. Total time spent: 30 mins including face to face time and time spent for planning, charting and co-ordination of care   Tamsen Meek, MD 11/03/23

## 2023-11-03 NOTE — Assessment & Plan Note (Signed)
01/2014: Right breast DCIS ER/PR positive 7 mm focus on MRI  06/13/2014 Rt lumpectomy  2 mm focus of DCIS negative margins, ER 70%, PR 80% positive status post radiation therapy;  05/07/2017:Left lumpectomy: Complex sclerosing lesion with usual ductal hyperplasia, focal atypical lobular hyperplasia, no invasive cancer identified Adjuvant radiation therapy   Prior treatment: Tamoxifen 20 mg daily 2015- 2020   Osteopenia: Was previously on Boniva.  Not taking it any further. Calcium and vitamin D Bone density 10 2015: T score -1.8.  Bone Density July 2020: T score -2   Biopsy 09/04/2020: Right breast medial: IDC with DCIS grade 2, ER greater than 95%, PR 30%, Ki-67 20% HER-2 not done because of insufficient tissue; right breast LOQ: DCIS   10/12/2020:Right lumpectomy Dwain Sarna): intermediate grade DCIS, clear margins, 1 right axillary lymph node negative for carcinoma treatment  .    Anastrozole Toxicities: Tolerating it well without any problems or concerns.  Denies any hot flashes or myalgias.    Breast Cancer Surveillance:  Mammograms 03/04/2023: Benign breast density category C breast MRIs 10/27/2023: Benign breast density category B Bone density 07/02/2021: T score -1.8: Osteopenia colon cancer calcium vitamin D and weightbearing exercises    Return to clinic in 1 year for follow-up.

## 2023-11-06 DIAGNOSIS — H524 Presbyopia: Secondary | ICD-10-CM | POA: Diagnosis not present

## 2023-11-06 DIAGNOSIS — Z961 Presence of intraocular lens: Secondary | ICD-10-CM | POA: Diagnosis not present

## 2023-11-06 DIAGNOSIS — H04123 Dry eye syndrome of bilateral lacrimal glands: Secondary | ICD-10-CM | POA: Diagnosis not present

## 2023-12-01 DIAGNOSIS — F411 Generalized anxiety disorder: Secondary | ICD-10-CM | POA: Diagnosis not present

## 2023-12-08 ENCOUNTER — Other Ambulatory Visit: Payer: Self-pay | Admitting: Medical Genetics

## 2023-12-20 DIAGNOSIS — M25552 Pain in left hip: Secondary | ICD-10-CM | POA: Diagnosis not present

## 2023-12-20 DIAGNOSIS — M5416 Radiculopathy, lumbar region: Secondary | ICD-10-CM | POA: Diagnosis not present

## 2023-12-22 DIAGNOSIS — F411 Generalized anxiety disorder: Secondary | ICD-10-CM | POA: Diagnosis not present

## 2023-12-30 ENCOUNTER — Other Ambulatory Visit (HOSPITAL_COMMUNITY)
Admission: RE | Admit: 2023-12-30 | Discharge: 2023-12-30 | Disposition: A | Discharge status: Home / Self Care | Payer: Self-pay | Source: Ambulatory Visit | Attending: Medical Genetics | Admitting: Medical Genetics

## 2024-01-01 DIAGNOSIS — M545 Low back pain, unspecified: Secondary | ICD-10-CM | POA: Diagnosis not present

## 2024-01-01 DIAGNOSIS — M7918 Myalgia, other site: Secondary | ICD-10-CM | POA: Diagnosis not present

## 2024-01-05 DIAGNOSIS — F419 Anxiety disorder, unspecified: Secondary | ICD-10-CM | POA: Diagnosis not present

## 2024-01-08 ENCOUNTER — Ambulatory Visit
Admission: RE | Admit: 2024-01-08 | Discharge: 2024-01-08 | Disposition: A | Discharge status: Home / Self Care | Payer: PPO | Source: Ambulatory Visit | Attending: Internal Medicine | Admitting: Internal Medicine

## 2024-01-08 DIAGNOSIS — M8588 Other specified disorders of bone density and structure, other site: Secondary | ICD-10-CM

## 2024-01-08 DIAGNOSIS — E2839 Other primary ovarian failure: Secondary | ICD-10-CM | POA: Diagnosis not present

## 2024-01-08 DIAGNOSIS — N958 Other specified menopausal and perimenopausal disorders: Secondary | ICD-10-CM | POA: Diagnosis not present

## 2024-01-10 LAB — GENECONNECT MOLECULAR SCREEN: Genetic Analysis Overall Interpretation: NEGATIVE

## 2024-01-12 ENCOUNTER — Encounter: Payer: Self-pay | Admitting: Hematology and Oncology

## 2024-01-15 ENCOUNTER — Inpatient Hospital Stay: Payer: PPO | Attending: Hematology and Oncology | Admitting: Hematology and Oncology

## 2024-01-15 DIAGNOSIS — Z170 Estrogen receptor positive status [ER+]: Secondary | ICD-10-CM

## 2024-01-15 DIAGNOSIS — C50311 Malignant neoplasm of lower-inner quadrant of right female breast: Secondary | ICD-10-CM | POA: Diagnosis not present

## 2024-01-15 NOTE — Assessment & Plan Note (Signed)
 01/2014: Right breast DCIS ER/PR positive 7 mm focus on MRI  06/13/2014 Rt lumpectomy  2 mm focus of DCIS negative margins, ER 70%, PR 80% positive status post radiation therapy;  05/07/2017:Left lumpectomy: Complex sclerosing lesion with usual ductal hyperplasia, focal atypical lobular hyperplasia, no invasive cancer identified Adjuvant radiation therapy   Prior treatment: Tamoxifen 20 mg daily 2015- 2020   Osteopenia: Was previously on Boniva.  Not taking it any further. Calcium and vitamin D Bone density 10 2015: T score -1.8.  Bone Density July 2020: T score -2   Biopsy 09/04/2020: Right breast medial: IDC with DCIS grade 2, ER greater than 95%, PR 30%, Ki-67 20% HER-2 not done because of insufficient tissue; right breast LOQ: DCIS   10/12/2020:Right lumpectomy Dwain Sarna): intermediate grade DCIS, clear margins, 1 right axillary lymph node negative for carcinoma treatment  .    Anastrozole Toxicities: Tolerating it well without any problems or concerns.  Denies any hot flashes or myalgias.    Breast Cancer Surveillance:  Mammograms 03/04/2023: Benign breast density category C breast MRIs 10/27/2023: Benign breast density category B Bone density 07/02/2021: T score -1.8: Osteopenia colon cancer calcium vitamin D and weightbearing exercises    Return to clinic in 1 year for follow-up.

## 2024-01-15 NOTE — Progress Notes (Signed)
HEMATOLOGY-ONCOLOGY TELEPHONE VISIT PROGRESS NOTE  I connected with our patient on 01/15/24 at 11:30 AM EST by telephone and verified that I am speaking with the correct person using two identifiers.  I discussed the limitations, risks, security and privacy concerns of performing an evaluation and management service by telephone and the availability of in person appointments.  I also discussed with the patient that there may be a patient responsible charge related to this service. The patient expressed understanding and agreed to proceed.   History of Present Illness:    History of Present Illness   Linda [name] is a 78 year old female with breast cancer and osteopenia who presents with concerns about her bone density results.  She is concerned about her recent bone density test results, which have shown a decrease from a T-score of -1.8 in August 2022 to -2.1, particularly in the left hip. The spine was not assessed due to her arthritis. Despite taking Boniva once a month for her bone health, her bone density has decreased.  She has been on anastrozole since December 2021, totaling three years and three months of treatment. Previously, she was on tamoxifen from 2015 to 2020. Her current medication regimen includes Boniva once a month for osteoporosis.  She has a history of breast cancer, with a recurrence in 2021 that was treated with a lumpectomy, revealing no invasive cancer, only DCIS.        Oncology History  Malignant neoplasm of lower-inner quadrant of right female breast (HCC)  02/03/2014 Breast MRI   7 X 6X 5 mm right breast abnormality as 2 immediately adjacent 3 mm foci of enhancement   05/19/2014 Initial Diagnosis   Malignant neoplasm of lower-inner quadrant of female breast DCIS   06/13/2014 Surgery   R Lumpectomy DCIS 2 mm size Grade 1 Er 70%, PR 80%, Margins Neg   07/25/2014 - 08/25/2014 Radiation Therapy   Adjuvant radiation therapy   08/10/2014 - 06/02/2019  Anti-estrogen oral therapy   Tamoxifen 20 mg daily x5 years   10/18/2014 Procedure   genetic testing was normal no mutations identified   05/07/2017 Surgery   Left lumpectomy: Complex sclerosing lesion with usual ductal hyperplasia, focal atypical lobular hyperplasia, no invasive cancer identified   08/11/2018 Breast MRI   Persistent non-mass enhancement within the left LOQ at anterior depth, at the site of patient's earlier MRI-guided biopsy revealing complex sclerosing lesion, atypical lobular hyperplasia and pseudoangiomatous stromal hyperplasia. This non-mass enhancement is not significantly changed in extent compared to the previous MRI of 02/05/2017 (pre biopsy). No evidence of malignancy within right breast.      09/04/2020 Relapse/Recurrence   Right breast biopsy medial: IDC with DCIS, grade 2, ER greater than 95%, PR 30%, Ki-67 20%, insufficient tumor for HER-2 testing   10/12/2020 Surgery   Right lumpectomy Dwain Sarna): intermediate grade DCIS, clear margins, 1 right axillary lymph node negative for carcinoma.   10/24/2020 -  Anti-estrogen oral therapy   Anastrozole     REVIEW OF SYSTEMS:   Constitutional: Denies fevers, chills or abnormal weight loss All other systems were reviewed with the patient and are negative. Observations/Objective:     Assessment Plan:  Malignant neoplasm of lower-inner quadrant of right female breast 01/2014: Right breast DCIS ER/PR positive 7 mm focus on MRI  06/13/2014 Rt lumpectomy  2 mm focus of DCIS negative margins, ER 70%, PR 80% positive status post radiation therapy;  05/07/2017:Left lumpectomy: Complex sclerosing lesion with usual ductal hyperplasia, focal atypical lobular hyperplasia,  no invasive cancer identified Adjuvant radiation therapy   Prior treatment: Tamoxifen 20 mg daily 2015- 2020   Osteopenia: Was previously on Boniva.  Not taking it any further. Calcium and vitamin D Bone density 10 2015: T score -1.8.  Bone Density July  2020: T score -2   Biopsy 09/04/2020: Right breast medial: IDC with DCIS grade 2, ER greater than 95%, PR 30%, Ki-67 20% HER-2 not done because of insufficient tissue; right breast LOQ: DCIS   10/12/2020:Right lumpectomy Dwain Sarna): intermediate grade DCIS, clear margins, 1 right axillary lymph node negative for carcinoma treatment  .    Anastrozole Toxicities: Tolerating it well without any problems or concerns.  Denies any hot flashes or myalgias.    Breast Cancer Surveillance:  Mammograms 03/04/2023: Benign breast density category C breast MRIs 10/27/2023: Benign breast density category B Bone density 07/02/2021: T score -1.8: Osteopenia colon cancer calcium vitamin D and weightbearing exercises  Bone density 01/08/2024: T-score -2.1: This is in spite of taking Boniva.  I recommended switching to Prolia every 6 months.   Return to clinic in 1 year for follow-up.     I discussed the assessment and treatment plan with the patient. The patient was provided an opportunity to ask questions and all were answered. The patient agreed with the plan and demonstrated an understanding of the instructions. The patient was advised to call back or seek an in-person evaluation if the symptoms worsen or if the condition fails to improve as anticipated.   I provided 20 minutes of non-face-to-face time during this encounter.  This includes time for charting and coordination of care   Tamsen Meek, MD

## 2024-01-16 ENCOUNTER — Telehealth: Payer: Self-pay | Admitting: Hematology and Oncology

## 2024-01-16 NOTE — Telephone Encounter (Signed)
 Scheduled appointments per 2/21 los. Patient is aware of all made appointments.

## 2024-01-18 DIAGNOSIS — F419 Anxiety disorder, unspecified: Secondary | ICD-10-CM | POA: Diagnosis not present

## 2024-01-19 DIAGNOSIS — C50311 Malignant neoplasm of lower-inner quadrant of right female breast: Secondary | ICD-10-CM | POA: Diagnosis not present

## 2024-01-19 DIAGNOSIS — I788 Other diseases of capillaries: Secondary | ICD-10-CM | POA: Diagnosis not present

## 2024-01-19 DIAGNOSIS — Z85828 Personal history of other malignant neoplasm of skin: Secondary | ICD-10-CM | POA: Diagnosis not present

## 2024-01-19 DIAGNOSIS — D1801 Hemangioma of skin and subcutaneous tissue: Secondary | ICD-10-CM | POA: Diagnosis not present

## 2024-01-19 DIAGNOSIS — D0511 Intraductal carcinoma in situ of right breast: Secondary | ICD-10-CM | POA: Diagnosis not present

## 2024-01-19 DIAGNOSIS — L821 Other seborrheic keratosis: Secondary | ICD-10-CM | POA: Diagnosis not present

## 2024-01-19 DIAGNOSIS — Z170 Estrogen receptor positive status [ER+]: Secondary | ICD-10-CM | POA: Diagnosis not present

## 2024-01-19 DIAGNOSIS — L814 Other melanin hyperpigmentation: Secondary | ICD-10-CM | POA: Diagnosis not present

## 2024-01-27 ENCOUNTER — Other Ambulatory Visit: Payer: Self-pay | Admitting: *Deleted

## 2024-01-27 DIAGNOSIS — C50311 Malignant neoplasm of lower-inner quadrant of right female breast: Secondary | ICD-10-CM

## 2024-01-28 ENCOUNTER — Inpatient Hospital Stay: Payer: PPO | Attending: Hematology and Oncology

## 2024-01-28 ENCOUNTER — Inpatient Hospital Stay: Payer: PPO

## 2024-01-28 VITALS — BP 132/73 | HR 65 | Temp 98.2°F | Resp 18

## 2024-01-28 DIAGNOSIS — M8580 Other specified disorders of bone density and structure, unspecified site: Secondary | ICD-10-CM | POA: Diagnosis not present

## 2024-01-28 DIAGNOSIS — Z170 Estrogen receptor positive status [ER+]: Secondary | ICD-10-CM | POA: Diagnosis not present

## 2024-01-28 DIAGNOSIS — C50311 Malignant neoplasm of lower-inner quadrant of right female breast: Secondary | ICD-10-CM | POA: Diagnosis not present

## 2024-01-28 LAB — CMP (CANCER CENTER ONLY)
ALT: 23 U/L (ref 0–44)
AST: 24 U/L (ref 15–41)
Albumin: 4.2 g/dL (ref 3.5–5.0)
Alkaline Phosphatase: 57 U/L (ref 38–126)
Anion gap: 3 — ABNORMAL LOW (ref 5–15)
BUN: 17 mg/dL (ref 8–23)
CO2: 31 mmol/L (ref 22–32)
Calcium: 8.7 mg/dL — ABNORMAL LOW (ref 8.9–10.3)
Chloride: 107 mmol/L (ref 98–111)
Creatinine: 0.86 mg/dL (ref 0.44–1.00)
GFR, Estimated: >60 mL/min (ref >60)
Glucose, Bld: 88 mg/dL (ref 70–99)
Potassium: 4.2 mmol/L (ref 3.5–5.1)
Sodium: 141 mmol/L (ref 135–145)
Total Bilirubin: 0.4 mg/dL (ref 0.0–1.2)
Total Protein: 6.6 g/dL (ref 6.5–8.1)

## 2024-01-28 LAB — CBC WITH DIFFERENTIAL (CANCER CENTER ONLY)
Abs Immature Granulocytes: 0.01 10*3/uL (ref 0.00–0.07)
Basophils Absolute: 0 10*3/uL (ref 0.0–0.1)
Basophils Relative: 0 %
Eosinophils Absolute: 0.2 10*3/uL (ref 0.0–0.5)
Eosinophils Relative: 2 %
HCT: 37.9 % (ref 36.0–46.0)
Hemoglobin: 12.7 g/dL (ref 12.0–15.0)
Immature Granulocytes: 0 %
Lymphocytes Relative: 17 %
Lymphs Abs: 1.4 10*3/uL (ref 0.7–4.0)
MCH: 34.5 pg — ABNORMAL HIGH (ref 26.0–34.0)
MCHC: 33.5 g/dL (ref 30.0–36.0)
MCV: 103 fL — ABNORMAL HIGH (ref 80.0–100.0)
Monocytes Absolute: 0.6 10*3/uL (ref 0.1–1.0)
Monocytes Relative: 8 %
Neutro Abs: 5.6 10*3/uL (ref 1.7–7.7)
Neutrophils Relative %: 73 %
Platelet Count: 218 10*3/uL (ref 150–400)
RBC: 3.68 MIL/uL — ABNORMAL LOW (ref 3.87–5.11)
RDW: 13.7 % (ref 11.5–15.5)
WBC Count: 7.8 10*3/uL (ref 4.0–10.5)
nRBC: 0 % (ref 0.0–0.2)

## 2024-01-28 MED ORDER — DENOSUMAB 60 MG/ML ~~LOC~~ SOSY
60.0000 mg | PREFILLED_SYRINGE | Freq: Once | SUBCUTANEOUS | Status: AC
  Administered 2024-01-28: 60 mg via SUBCUTANEOUS
  Filled 2024-01-28: qty 1

## 2024-01-28 NOTE — Patient Instructions (Signed)
 Denosumab Injection (Osteoporosis) What is this medication? DENOSUMAB (den oh SUE mab) prevents and treats osteoporosis. It works by Interior and spatial designer stronger and less likely to break (fracture). It is a monoclonal antibody. This medicine may be used for other purposes; ask your health care provider or pharmacist if you have questions. COMMON BRAND NAME(S): Prolia What should I tell my care team before I take this medication? They need to know if you have any of these conditions: Dental or gum disease Had thyroid or parathyroid (glands located in neck) surgery Having dental surgery or a tooth pulled Kidney disease Low levels of calcium in the blood On dialysis Poor nutrition Thyroid disease Trouble absorbing nutrients from your food An unusual or allergic reaction to denosumab, other medications, foods, dyes, or preservatives Pregnant or trying to get pregnant Breastfeeding How should I use this medication? This medication is injected under the skin. It is given by your care team in a hospital or clinic setting. A special MedGuide will be given to you before each treatment. Be sure to read this information carefully each time. Talk to your care team about the use of this medication in children. Special care may be needed. Overdosage: If you think you have taken too much of this medicine contact a poison control center or emergency room at once. NOTE: This medicine is only for you. Do not share this medicine with others. What if I miss a dose? Keep appointments for follow-up doses. It is important not to miss your dose. Call your care team if you are unable to keep an appointment. What may interact with this medication? Do not take this medication with any of the following: Other medications that contain denosumab This medication may also interact with the following: Medications that lower your chance of fighting infection Steroid medications, such as prednisone or cortisone This  list may not describe all possible interactions. Give your health care provider a list of all the medicines, herbs, non-prescription drugs, or dietary supplements you use. Also tell them if you smoke, drink alcohol, or use illegal drugs. Some items may interact with your medicine. What should I watch for while using this medication? Your condition will be monitored carefully while you are receiving this medication. You may need blood work done while taking this medication. This medication may increase your risk of getting an infection. Call your care team for advice if you get a fever, chills, sore throat, or other symptoms of a cold or flu. Do not treat yourself. Try to avoid being around people who are sick. Tell your dentist and dental surgeon that you are taking this medication. You should not have major dental surgery while on this medication. See your dentist to have a dental exam and fix any dental problems before starting this medication. Take good care of your teeth while on this medication. Make sure you see your dentist for regular follow-up appointments. This medication may cause low levels of calcium in your body. The risk of severe side effects is increased in people with kidney disease. Your care team may prescribe calcium and vitamin D to help prevent low calcium levels while you take this medication. It is important to take calcium and vitamin D as directed by your care team. Talk to your care team if you may be pregnant. Serious birth defects may occur if you take this medication during pregnancy and for 5 months after the last dose. You will need a negative pregnancy test before starting this medication. Contraception  is recommended while taking this medication and for 5 months after the last dose. Your care team can help you find the option that works for you. Talk to your care team before breastfeeding. Changes to your treatment plan may be needed. What side effects may I notice from  receiving this medication? Side effects that you should report to your care team as soon as possible: Allergic reactions--skin rash, itching, hives, swelling of the face, lips, tongue, or throat Infection--fever, chills, cough, sore throat, wounds that don't heal, pain or trouble when passing urine, general feeling of discomfort or being unwell Low calcium level--muscle pain or cramps, confusion, tingling, or numbness in the hands or feet Osteonecrosis of the jaw--pain, swelling, or redness in the mouth, numbness of the jaw, poor healing after dental work, unusual discharge from the mouth, visible bones in the mouth Severe bone, joint, or muscle pain Skin infection--skin redness, swelling, warmth, or pain Side effects that usually do not require medical attention (report these to your care team if they continue or are bothersome): Back pain Headache Joint pain Muscle pain Pain in the hands, arms, legs, or feet Runny or stuffy nose Sore throat This list may not describe all possible side effects. Call your doctor for medical advice about side effects. You may report side effects to FDA at 1-800-FDA-1088. Where should I keep my medication? This medication is given in a hospital or clinic. It will not be stored at home. NOTE: This sheet is a summary. It may not cover all possible information. If you have questions about this medicine, talk to your doctor, pharmacist, or health care provider.  2024 Elsevier/Gold Standard (2022-12-16 00:00:00)

## 2024-02-09 DIAGNOSIS — F419 Anxiety disorder, unspecified: Secondary | ICD-10-CM | POA: Diagnosis not present

## 2024-03-10 DIAGNOSIS — F411 Generalized anxiety disorder: Secondary | ICD-10-CM | POA: Diagnosis not present

## 2024-03-14 DIAGNOSIS — H532 Diplopia: Secondary | ICD-10-CM | POA: Diagnosis not present

## 2024-03-22 DIAGNOSIS — G3184 Mild cognitive impairment, so stated: Secondary | ICD-10-CM | POA: Diagnosis not present

## 2024-03-22 DIAGNOSIS — E785 Hyperlipidemia, unspecified: Secondary | ICD-10-CM | POA: Diagnosis not present

## 2024-03-22 DIAGNOSIS — R7301 Impaired fasting glucose: Secondary | ICD-10-CM | POA: Diagnosis not present

## 2024-03-22 DIAGNOSIS — R635 Abnormal weight gain: Secondary | ICD-10-CM | POA: Diagnosis not present

## 2024-03-22 DIAGNOSIS — F411 Generalized anxiety disorder: Secondary | ICD-10-CM | POA: Diagnosis not present

## 2024-03-24 DIAGNOSIS — F411 Generalized anxiety disorder: Secondary | ICD-10-CM | POA: Diagnosis not present

## 2024-03-28 DIAGNOSIS — H532 Diplopia: Secondary | ICD-10-CM | POA: Diagnosis not present

## 2024-04-13 DIAGNOSIS — Z79899 Other long term (current) drug therapy: Secondary | ICD-10-CM | POA: Diagnosis not present

## 2024-04-13 DIAGNOSIS — Z5181 Encounter for therapeutic drug level monitoring: Secondary | ICD-10-CM | POA: Diagnosis not present

## 2024-04-13 DIAGNOSIS — F419 Anxiety disorder, unspecified: Secondary | ICD-10-CM | POA: Diagnosis not present

## 2024-04-14 ENCOUNTER — Telehealth: Payer: Self-pay | Admitting: *Deleted

## 2024-04-14 NOTE — Telephone Encounter (Signed)
 Received call from pt requesting advice from MD if she needs to stop p.o Boniva  since her tx plan has changed to Prolia  injections.  Per MD pt needing to stop Boniva , pt educated and verbalized understanding.

## 2024-04-20 DIAGNOSIS — K5900 Constipation, unspecified: Secondary | ICD-10-CM | POA: Diagnosis not present

## 2024-04-20 DIAGNOSIS — Z860101 Personal history of adenomatous and serrated colon polyps: Secondary | ICD-10-CM | POA: Diagnosis not present

## 2024-04-26 ENCOUNTER — Ambulatory Visit
Admission: RE | Admit: 2024-04-26 | Discharge: 2024-04-26 | Disposition: A | Discharge status: Home / Self Care | Payer: PPO | Source: Ambulatory Visit | Attending: Hematology and Oncology | Admitting: Hematology and Oncology

## 2024-04-26 DIAGNOSIS — Z08 Encounter for follow-up examination after completed treatment for malignant neoplasm: Secondary | ICD-10-CM | POA: Diagnosis not present

## 2024-04-26 DIAGNOSIS — Z170 Estrogen receptor positive status [ER+]: Secondary | ICD-10-CM

## 2024-04-26 DIAGNOSIS — Z86000 Personal history of in-situ neoplasm of breast: Secondary | ICD-10-CM | POA: Diagnosis not present

## 2024-04-27 DIAGNOSIS — F411 Generalized anxiety disorder: Secondary | ICD-10-CM | POA: Diagnosis not present

## 2024-05-13 DIAGNOSIS — Z860100 Personal history of colon polyps, unspecified: Secondary | ICD-10-CM | POA: Diagnosis not present

## 2024-05-13 DIAGNOSIS — Z09 Encounter for follow-up examination after completed treatment for conditions other than malignant neoplasm: Secondary | ICD-10-CM | POA: Diagnosis not present

## 2024-05-13 DIAGNOSIS — K5730 Diverticulosis of large intestine without perforation or abscess without bleeding: Secondary | ICD-10-CM | POA: Diagnosis not present

## 2024-05-17 ENCOUNTER — Encounter: Payer: Self-pay | Admitting: Hematology and Oncology

## 2024-05-17 ENCOUNTER — Encounter: Payer: Self-pay | Admitting: *Deleted

## 2024-05-17 NOTE — Progress Notes (Signed)
 Received mychart message from pt wanting to discuss stopping AI therapy due to side effects, especially the bone loss.  Per MD request, RN successfully faxed [facility] request as well as sent scheduling message for f/u in 4 weeks.

## 2024-05-24 DIAGNOSIS — H532 Diplopia: Secondary | ICD-10-CM | POA: Diagnosis not present

## 2024-05-25 DIAGNOSIS — Z85828 Personal history of other malignant neoplasm of skin: Secondary | ICD-10-CM | POA: Diagnosis not present

## 2024-05-25 DIAGNOSIS — L821 Other seborrheic keratosis: Secondary | ICD-10-CM | POA: Diagnosis not present

## 2024-05-25 DIAGNOSIS — L720 Epidermal cyst: Secondary | ICD-10-CM | POA: Diagnosis not present

## 2024-05-25 DIAGNOSIS — F338 Other recurrent depressive disorders: Secondary | ICD-10-CM | POA: Diagnosis not present

## 2024-05-25 DIAGNOSIS — D692 Other nonthrombocytopenic purpura: Secondary | ICD-10-CM | POA: Diagnosis not present

## 2024-05-25 DIAGNOSIS — F419 Anxiety disorder, unspecified: Secondary | ICD-10-CM | POA: Diagnosis not present

## 2024-05-25 DIAGNOSIS — F411 Generalized anxiety disorder: Secondary | ICD-10-CM | POA: Diagnosis not present

## 2024-05-25 DIAGNOSIS — L6611 Classic lichen planopilaris: Secondary | ICD-10-CM | POA: Diagnosis not present

## 2024-06-03 DIAGNOSIS — C50311 Malignant neoplasm of lower-inner quadrant of right female breast: Secondary | ICD-10-CM | POA: Diagnosis not present

## 2024-06-09 DIAGNOSIS — E785 Hyperlipidemia, unspecified: Secondary | ICD-10-CM | POA: Diagnosis not present

## 2024-06-09 DIAGNOSIS — R7301 Impaired fasting glucose: Secondary | ICD-10-CM | POA: Diagnosis not present

## 2024-06-09 DIAGNOSIS — R635 Abnormal weight gain: Secondary | ICD-10-CM | POA: Diagnosis not present

## 2024-06-09 DIAGNOSIS — G3184 Mild cognitive impairment, so stated: Secondary | ICD-10-CM | POA: Diagnosis not present

## 2024-06-09 DIAGNOSIS — F411 Generalized anxiety disorder: Secondary | ICD-10-CM | POA: Diagnosis not present

## 2024-06-14 ENCOUNTER — Inpatient Hospital Stay: Attending: Hematology and Oncology | Admitting: Hematology and Oncology

## 2024-06-14 DIAGNOSIS — Z170 Estrogen receptor positive status [ER+]: Secondary | ICD-10-CM

## 2024-06-14 DIAGNOSIS — C50311 Malignant neoplasm of lower-inner quadrant of right female breast: Secondary | ICD-10-CM | POA: Diagnosis not present

## 2024-06-14 NOTE — Assessment & Plan Note (Signed)
 01/2014: Right breast DCIS ER/PR positive 7 mm focus on MRI  06/13/2014 Rt lumpectomy  2 mm focus of DCIS negative margins, ER 70%, PR 80% positive status post radiation therapy;  05/07/2017:Left lumpectomy: Complex sclerosing lesion with usual ductal hyperplasia, focal atypical lobular hyperplasia, no invasive cancer identified Adjuvant radiation therapy   Prior treatment: Tamoxifen  20 mg daily 2015- 2020   Osteopenia: Was previously on Boniva .  Not taking it any further. Calcium  and vitamin D  Bone density 10 2015: T score -1.8.  Bone Density July 2020: T score -2   Biopsy 09/04/2020: Right breast medial: IDC with DCIS grade 2, ER greater than 95%, PR 30%, Ki-67 20% HER-2 not done because of insufficient tissue; right breast LOQ: DCIS   10/12/2020:Right lumpectomy Viktoria): intermediate grade DCIS, clear margins, 1 right axillary lymph node negative for carcinoma treatment  .    Anastrozole  Toxicities: Tolerating it well without any problems or concerns.  Denies any hot flashes or myalgias.    Breast Cancer Surveillance:  Mammograms 03/04/2023: Benign breast density category C breast MRIs 10/27/2023: Benign breast density category B Bone density 07/02/2021: T score -1.8: Osteopenia colon cancer calcium  vitamin D  and weightbearing exercises  Bone density 01/08/2024: T-score -2.1: This is in spite of taking Boniva .  I recommended switching to Prolia  every 6 months.   Since she took 5 years of tamoxifen  and 5 years of anastrozole  I believe that we can discontinue her antiestrogen therapy at this time.

## 2024-06-14 NOTE — Progress Notes (Signed)
 HEMATOLOGY-ONCOLOGY TELEPHONE VISIT PROGRESS NOTE  I connected with our patient on 06/14/24 at  2:15 PM EDT by telephone and verified that I am speaking with the correct person using two identifiers.  I discussed the limitations, risks, security and privacy concerns of performing an evaluation and management service by telephone and the availability of in person appointments.  I also discussed with the patient that there may be a patient responsible charge related to this service. The patient expressed understanding and agreed to proceed.   History of Present Illness:   History of Present Illness Linda [name] is a 78 year old female with breast cancer who presents for discussion regarding hormone therapy treatment.  She is concerned about bone density, with a T-score decrease from -2 in 2020 to -2.1 currently. She previously took Boniva  for bone health. She is on anastrozole  since 2021, following five years of tamoxifen , for estrogen receptor-positive breast cancer. A recent breast cancer index test shows a 4.6% risk of recurrence if stopping now, versus 1.5-1.9% if continuing for another year. She experiences significant hair loss, attributed to hormone therapy, and is concerned about regrowth after discontinuation. Regular MRIs and mammograms are performed, with the last MRI in December showing decreased breast density from C to B, improving mammogram accuracy. She experiences PVCs but is not particularly concerned about them.    Oncology History  Malignant neoplasm of lower-inner quadrant of right female breast (HCC)  02/03/2014 Breast MRI   7 X 6X 5 mm right breast abnormality as 2 immediately adjacent 3 mm foci of enhancement   05/19/2014 Initial Diagnosis   Malignant neoplasm of lower-inner quadrant of female breast DCIS   06/13/2014 Surgery   R Lumpectomy DCIS 2 mm size Grade 1 Er 70%, PR 80%, Margins Neg   07/25/2014 - 08/25/2014 Radiation Therapy   Adjuvant radiation therapy    08/10/2014 - 06/02/2019 Anti-estrogen oral therapy   Tamoxifen  20 mg daily x5 years   10/18/2014 Procedure   genetic testing was normal no mutations identified   05/07/2017 Surgery   Left lumpectomy: Complex sclerosing lesion with usual ductal hyperplasia, focal atypical lobular hyperplasia, no invasive cancer identified   08/11/2018 Breast MRI   Persistent non-mass enhancement within the left LOQ at anterior depth, at the site of patient's earlier MRI-guided biopsy revealing complex sclerosing lesion, atypical lobular hyperplasia and pseudoangiomatous stromal hyperplasia. This non-mass enhancement is not significantly changed in extent compared to the previous MRI of 02/05/2017 (pre biopsy). No evidence of malignancy within right breast.      09/04/2020 Relapse/Recurrence   Right breast biopsy medial: IDC with DCIS, grade 2, ER greater than 95%, PR 30%, Ki-67 20%, insufficient tumor for HER-2 testing   10/12/2020 Surgery   Right lumpectomy Viktoria): intermediate grade DCIS, clear margins, 1 right axillary lymph node negative for carcinoma.   10/24/2020 -  Anti-estrogen oral therapy   Anastrozole      REVIEW OF SYSTEMS:   Constitutional: Denies fevers, chills or abnormal weight loss All other systems were reviewed with the patient and are negative. Observations/Objective:     Assessment Plan:  Malignant neoplasm of lower-inner quadrant of right female breast 01/2014: Right breast DCIS ER/PR positive 7 mm focus on MRI  06/13/2014 Rt lumpectomy  2 mm focus of DCIS negative margins, ER 70%, PR 80% positive status post radiation therapy;  05/07/2017:Left lumpectomy: Complex sclerosing lesion with usual ductal hyperplasia, focal atypical lobular hyperplasia, no invasive cancer identified Adjuvant radiation therapy   Prior treatment: Tamoxifen  20 mg  daily 2015- 2020   Osteopenia: Was previously on Boniva .  Not taking it any further. Calcium  and vitamin D  Bone density 10 2015: T score  -1.8.  Bone Density July 2020: T score -2 Bone density  01/08/24: T score -2.1   Biopsy 09/04/2020: Right breast medial: IDC with DCIS grade 2, ER greater than 95%, PR 30%, Ki-67 20% HER-2 not done because of insufficient tissue; right breast LOQ: DCIS   10/12/2020:Right lumpectomy Viktoria): intermediate grade DCIS, clear margins, 1 right axillary lymph node negative for carcinoma treatment  .    Anastrozole  Toxicities: Tolerating it well without any problems or concerns.  Denies any hot flashes or myalgias.    Breast Cancer Surveillance:  Mammograms 03/04/2023: Benign breast density category C breast MRIs 10/27/2023: Benign breast density category B Bone density 07/02/2021: T score -1.8: Osteopenia colon cancer calcium  vitamin D  and weightbearing exercises  Bone density 01/08/2024: T-score -2.1: Prolia  every 6 months.   Discussion regarding continuation of antiestrogen therapy: We decided to continue her antiestrogen therapy for 1 more year and then discuss whether we should stop it or continue it.  I discussed with her that the risk of recurrence would decrease from 4.6% to 1.5% by extending the endocrine therapy based upon breast cancer index.  Assessment & Plan Malignant neoplasm of lower-inner quadrant of right female breast Estrogen receptor-positive breast cancer on anastrozole  since 2021. Breast cancer index test indicates stopping anastrozole  increases recurrence risk to 4.6% over five years; continuing reduces risk to 1.5-1.9%. Experiencing hair loss, no significant bone density or cardiac issues. - Continue anastrozole  for one more year. - Perform MRI and mammogram every six months, alternating each test. - Consider reducing MRI frequency to every other year based on decreased breast density.  Osteopenia Bone density shows mild decrease from T-score of -2.0 to -2.1, not significant.      I discussed the assessment and treatment plan with the patient. The patient was provided  an opportunity to ask questions and all were answered. The patient agreed with the plan and demonstrated an understanding of the instructions. The patient was advised to call back or seek an in-person evaluation if the symptoms worsen or if the condition fails to improve as anticipated.   I provided 20 minutes of non-face-to-face time during this encounter.  This includes time for charting and coordination of care   Naomi MARLA Chad, MD

## 2024-06-17 ENCOUNTER — Encounter: Payer: Self-pay | Admitting: Hematology and Oncology

## 2024-06-24 ENCOUNTER — Other Ambulatory Visit: Payer: Self-pay | Admitting: Hematology and Oncology

## 2024-06-27 ENCOUNTER — Encounter: Payer: Self-pay | Admitting: Hematology and Oncology

## 2024-07-26 ENCOUNTER — Encounter: Payer: Self-pay | Admitting: Physical Medicine and Rehabilitation

## 2024-07-26 ENCOUNTER — Ambulatory Visit: Admitting: Physical Medicine and Rehabilitation

## 2024-07-26 ENCOUNTER — Other Ambulatory Visit (INDEPENDENT_AMBULATORY_CARE_PROVIDER_SITE_OTHER)

## 2024-07-26 DIAGNOSIS — M47817 Spondylosis without myelopathy or radiculopathy, lumbosacral region: Secondary | ICD-10-CM | POA: Diagnosis not present

## 2024-07-26 DIAGNOSIS — M545 Low back pain, unspecified: Secondary | ICD-10-CM | POA: Diagnosis not present

## 2024-07-26 DIAGNOSIS — G8929 Other chronic pain: Secondary | ICD-10-CM

## 2024-07-26 DIAGNOSIS — M47816 Spondylosis without myelopathy or radiculopathy, lumbar region: Secondary | ICD-10-CM | POA: Diagnosis not present

## 2024-07-26 NOTE — Progress Notes (Signed)
 Linda [name] - 78 y.o. female MRN [id number]  Date of birth: May 18, 1946  Office Visit Note: Visit Date: 07/26/2024 PCP: Loreli Elsie JONETTA Mickey., MD Referred by: Loreli Elsie JONETTA Mickey., MD  Subjective: Chief Complaint  Patient presents with   Lower Back - Pain   HPI: Linda [name] is a 78 y.o. female who comes in today per the request of Dr. Elsie Loreli for evaluation of chronic, worsening and severe bilateral lower back pain, intermittent radiation of pain to bilateral hips and groin regions. Lower back pain seems to be biggest pain generator. She reports chronic lower back pain for many years, worsened about 3 months ago after fall down steps. Her pain worsens when moving from sitting to standing position. She describes pain as sore, throbbing and stiff sensation, currently rates as 6 out of 10. Some relief of pain with home exercise regimen, rest and use of medications. She recently started formal physical therapy, plans on continuing with these treatments. No prior lumbar imaging. No prior lumbar MRI imaging. Patient denies focal weakness, numbness and tingling. No recent trauma or falls.   Patients course is complicated by history of breast cancer, osteopenia and anxiety.      Review of Systems  Musculoskeletal:  Positive for back pain.  Neurological:  Negative for tingling, sensory change, focal weakness and weakness.  All other systems reviewed and are negative.  Otherwise per HPI.  Assessment & Plan: Visit Diagnoses:    ICD-10-CM   1. Chronic bilateral low back pain without sciatica  M54.50 XR Lumbar Spine 2-3 Views   G89.29 MR LUMBAR SPINE WO CONTRAST    2. Spondylosis without myelopathy or radiculopathy, lumbosacral region  M47.817 MR LUMBAR SPINE WO CONTRAST    3. Facet arthropathy, lumbar  M47.816        Plan: Findings:  Chronic, worsening and severe bilateral lower back pain. Intermittent radiation of pain to hips and groin regions. Axial back pain seems to be  biggest pain generator. Patient continues to have pain despite good conservative therapies such as formal physical therapy, home exercise regimen, rest and use of medications. Patients clinical presentation and exam are consistent with facet mediated pain. I obtained lumbar radiographs in the office today that show facet arthropathy, biforaminal narrowing and disc space narrowing at L4-L5 and L5-S1. No spondylolisthesis. We discussed treatment plan in detail today. Given her history of lower back pain and breast cancer I placed order for lumbar MRI imaging. Depending on results of MRI imaging we discussed possibility of performing lumbar injections. She has no questions at this time. No red flag symptoms noted upon exam today.     Meds & Orders: No orders of the defined types were placed in this encounter.   Orders Placed This Encounter  Procedures   XR Lumbar Spine 2-3 Views   MR LUMBAR SPINE WO CONTRAST    Follow-up: Return for Lumbar MRI review.   Procedures: No procedures performed      Clinical History: No specialty comments available.   She reports that she quit smoking about 50 years ago. Her smoking use included cigarettes. She has never used smokeless tobacco. No results for input(s): HGBA1C, LABURIC in the last 8760 hours.  Objective:  VS:  HT:    WT:   BMI:     BP:   HR: bpm  TEMP: ( )  RESP:  Physical Exam Vitals and nursing note reviewed.  HENT:     Head: Normocephalic and atraumatic.  Right Ear: External ear normal.     Left Ear: External ear normal.     Nose: Nose normal.     Mouth/Throat:     Mouth: Mucous membranes are moist.  Eyes:     Extraocular Movements: Extraocular movements intact.  Cardiovascular:     Rate and Rhythm: Normal rate.     Pulses: Normal pulses.  Pulmonary:     Effort: Pulmonary effort is normal.  Abdominal:     General: Abdomen is flat. There is no distension.  Musculoskeletal:        General: Tenderness present.      Cervical back: Normal range of motion.     Comments: Patient rises from seated position to standing without difficulty. Pain noted with facet loading and lumbar extension. 5/5 strength noted with bilateral hip flexion, knee flexion/extension, ankle dorsiflexion/plantarflexion and EHL. No clonus noted bilaterally. No pain upon palpation of greater trochanters. No pain with internal/external rotation of bilateral hips. Sensation intact bilaterally. Negative slump test bilaterally. Ambulates without aid, gait steady.     Skin:    General: Skin is warm and dry.     Capillary Refill: Capillary refill takes less than 2 seconds.  Neurological:     General: No focal deficit present.     Mental Status: She is alert and oriented to person, place, and time.  Psychiatric:        Mood and Affect: Mood normal.        Behavior: Behavior normal.     Ortho Exam  Imaging: No results found.  Past Medical/Family/Surgical/Social History: Medications & Allergies reviewed per EMR, new medications updated. 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   Lichen plano-pilaris 12/27/2015   Genetic testing 12/20/2014   Family history of breast cancer    Family history of prostate cancer    Family history of colon cancer    Palpitations 10/30/2014   Premature ventricular contraction 10/30/2014   Hyperlipidemia 10/30/2014   Osteopenia 07/10/2014   Malignant neoplasm of lower-inner quadrant of right female breast (HCC) 05/19/2014   Ductal carcinoma in situ (DCIS) of right breast 05/03/2014   Abnormal mammography 03/07/2014   ASCUS (atypical squamous cells of undetermined significance) on Pap smear 02/16/2014   Unexplained endometrial cells on cervical cytology 02/16/2014   Atrophic vaginitis 01/03/2014   Constipation 01/03/2014   Insomnia 05/19/2013   Cervical dysplasia    Hypercholesteremia    ANXIETY 09/11/2009   ANAL FISSURE 09/11/2009   ARTHRITIS  09/11/2009   DIVERTICULOSIS, COLON 09/06/2009   RECTAL BLEEDING 09/06/2009   RECTAL PAIN 09/06/2009   History of colonic polyps 09/06/2009   Past Medical History:  Diagnosis Date   Anxiety    Arthritis    Atrophic vaginitis    Breast cancer (HCC)    right (DCIS) s/p lumpectomy 7/15   Cataract    BILATERAL   Cervical dysplasia 1975   DCIS (ductal carcinoma in situ)    RIGHT   Depression    Dysrhythmia    PVCs   Family history of adverse reaction to anesthesia    Pt stated that her mother had difficulty waking up after anesthesia   Family history of breast cancer    Family history of colon cancer    Family history of prostate cancer    Hx of radiation therapy 08/02/14- 08/24/14   right reast 4256 cGy i 16 sessions, no boost   Hypercholesteremia    Insomnia  Osteopenia 11/2011   T score -1.7 FRAX not calculated due to history of bisphosphonates and current Evista    Personal history of radiation therapy 2015   right side   PVC's (premature ventricular contractions)    PMH   Seasonal allergies    Skin cancer 2006   basal cell of face   Family History  Problem Relation Age of Onset   Breast cancer Mother        DIAGNOSED IN HER 80'S   Hyperlipidemia Mother    Osteoporosis Mother    Breast cancer Sister        DIAGNOSED AT AGE 50   Hypertension Maternal Grandmother    Breast cancer Maternal Grandmother        possibly breast cancer; had mastectomy in her 30s-40s, but don't know if it was due to cancer   Heart disease Maternal Grandfather    Stroke Maternal Grandfather    Diabetes Son        TYPE 1 DIABETES   Colon cancer Paternal Grandfather    Cancer Paternal Grandfather        colon   Heart failure Son    Kidney cancer Paternal Grandmother        bladder/kidney cancer   Prostate cancer Maternal Uncle    Past Surgical History:  Procedure Laterality Date   APPENDECTOMY  1955   BREAST BIOPSY  2010   rt bx   BREAST BIOPSY  2009   rt   BREAST EXCISIONAL  BIOPSY Left 2018   ALH & COMPLEX SCLEROSING LESION   BREAST LUMPECTOMY  1983   BENIGN-rt   BREAST LUMPECTOMY Right 2015   dcis   BREAST LUMPECTOMY WITH RADIOACTIVE SEED AND SENTINEL LYMPH NODE BIOPSY Right 10/12/2020   Procedure: RIGHT BREAST LUMPECTOMY X 2 WITH RADIOACTIVE SEED AND SENTINEL LYMPH NODE BIOPSY;  Surgeon: Ebbie Cough, MD;  Location: [facility];  Service: General;  Laterality: Right;   BREAST LUMPECTOMY WITH RADIOACTIVE SEED LOCALIZATION Right 06/13/2014   Procedure: RIGHT BREAST  RADIOACTIVE SEED GUIDED LUMPECTOMY;  Surgeon: Cough Ebbie, MD;  Location: [facility];  Service: General;  Laterality: Right;for DCIS   CATARACT EXTRACTION     Both eyes   cataract surgery  2005   COLONOSCOPY     COLPOSCOPY     EXCISION OF BASAL CELL CA FROM [facility]  2006   FACELIFT  2014   FACIAL COSMETIC SURGERY     GYNECOLOGIC CRYOSURGERY     KNEE SURGERY  2013   Arthroscopic-rt   RADIOACTIVE SEED GUIDED EXCISIONAL BREAST BIOPSY Left 05/07/2017   Procedure: LEFT RADIOACTIVE SEED GUIDED EXCISIONAL BREAST BIOPSY;  Surgeon: Ebbie Cough, MD;  Location: [facility];  Service: General;  Laterality: Left;   REPOSITION OF LENS Right 10/01/2018   Procedure: REPOSITION OF LENS RIGHT EYE;  Surgeon: Tobie Baptist, MD;  Location: [facility];  Service: Ophthalmology;  Laterality: Right;   Social History   Occupational History   Not on file  Tobacco Use   Smoking status: Former    Current packs/day: 0.00    Types: Cigarettes    Quit date: 05/19/1974    Years since quitting: 50.2   Smokeless tobacco: Never   Tobacco comments:    smoked in college  Vaping Use   Vaping status: Never Used  Substance and Sexual Activity   Alcohol  use: Yes    Alcohol /week: 10.0 standard drinks of alcohol     Types: 10 Standard drinks or equivalent per week  Comment: daily ETOH cocktail nightly   Drug use: No   Sexual activity: Not Currently    Birth  control/protection: Post-menopausal    Comment: G4 P4

## 2024-07-26 NOTE — Progress Notes (Signed)
 Core Outcome Measures Index (COMI) Back Score  Average Pain 6  COMI Score 60 %

## 2024-07-26 NOTE — Progress Notes (Signed)
 Pain Scale   Average Pain 3 Patient advising she has lower back pain radiating to bilateral hip areas. Patient advising her pain increases when getting up from seated position , resting does ease pain but pain is pretty constant per patient        +Driver, -BT, -Dye Allergies.

## 2024-07-28 ENCOUNTER — Other Ambulatory Visit: Payer: Self-pay | Admitting: *Deleted

## 2024-07-28 DIAGNOSIS — C50311 Malignant neoplasm of lower-inner quadrant of right female breast: Secondary | ICD-10-CM

## 2024-07-29 ENCOUNTER — Inpatient Hospital Stay: Payer: PPO | Attending: Hematology and Oncology

## 2024-07-29 ENCOUNTER — Inpatient Hospital Stay: Payer: PPO

## 2024-07-29 VITALS — BP 109/66 | HR 78 | Temp 98.5°F | Resp 18

## 2024-07-29 DIAGNOSIS — M8580 Other specified disorders of bone density and structure, unspecified site: Secondary | ICD-10-CM | POA: Insufficient documentation

## 2024-07-29 DIAGNOSIS — Z170 Estrogen receptor positive status [ER+]: Secondary | ICD-10-CM

## 2024-07-29 DIAGNOSIS — Z79899 Other long term (current) drug therapy: Secondary | ICD-10-CM | POA: Diagnosis not present

## 2024-07-29 LAB — CBC WITH DIFFERENTIAL (CANCER CENTER ONLY)
Abs Immature Granulocytes: 0.01 K/uL (ref 0.00–0.07)
Basophils Absolute: 0 K/uL (ref 0.0–0.1)
Basophils Relative: 1 %
Eosinophils Absolute: 0.2 K/uL (ref 0.0–0.5)
Eosinophils Relative: 4 %
HCT: 37.1 % (ref 36.0–46.0)
Hemoglobin: 12.5 g/dL (ref 12.0–15.0)
Immature Granulocytes: 0 %
Lymphocytes Relative: 21 %
Lymphs Abs: 1.2 K/uL (ref 0.7–4.0)
MCH: 33.6 pg (ref 26.0–34.0)
MCHC: 33.7 g/dL (ref 30.0–36.0)
MCV: 99.7 fL (ref 80.0–100.0)
Monocytes Absolute: 0.5 K/uL (ref 0.1–1.0)
Monocytes Relative: 10 %
Neutro Abs: 3.7 K/uL (ref 1.7–7.7)
Neutrophils Relative %: 64 %
Platelet Count: 217 K/uL (ref 150–400)
RBC: 3.72 MIL/uL — ABNORMAL LOW (ref 3.87–5.11)
RDW: 13.1 % (ref 11.5–15.5)
WBC Count: 5.7 K/uL (ref 4.0–10.5)
nRBC: 0 % (ref 0.0–0.2)

## 2024-07-29 LAB — CMP (CANCER CENTER ONLY)
ALT: 16 U/L (ref 0–44)
AST: 23 U/L (ref 15–41)
Albumin: 4.3 g/dL (ref 3.5–5.0)
Alkaline Phosphatase: 56 U/L (ref 38–126)
Anion gap: 7 (ref 5–15)
BUN: 18 mg/dL (ref 8–23)
CO2: 27 mmol/L (ref 22–32)
Calcium: 9.1 mg/dL (ref 8.9–10.3)
Chloride: 106 mmol/L (ref 98–111)
Creatinine: 0.85 mg/dL (ref 0.44–1.00)
GFR, Estimated: >60 mL/min (ref >60)
Glucose, Bld: 86 mg/dL (ref 70–99)
Potassium: 4.1 mmol/L (ref 3.5–5.1)
Sodium: 140 mmol/L (ref 135–145)
Total Bilirubin: 0.4 mg/dL (ref 0.0–1.2)
Total Protein: 7.2 g/dL (ref 6.5–8.1)

## 2024-07-29 MED ORDER — DENOSUMAB 60 MG/ML ~~LOC~~ SOSY
60.0000 mg | PREFILLED_SYRINGE | Freq: Once | SUBCUTANEOUS | Status: AC
  Administered 2024-07-29: 60 mg via SUBCUTANEOUS
  Filled 2024-07-29: qty 1

## 2024-07-29 NOTE — Addendum Note (Signed)
 Addended by: TAMELA RAISIN R on: 07/29/2024 03:13 PM   Modules accepted: Orders

## 2024-08-15 DIAGNOSIS — Z049 Encounter for examination and observation for unspecified reason: Secondary | ICD-10-CM | POA: Diagnosis not present

## 2024-08-22 ENCOUNTER — Ambulatory Visit
Admission: RE | Admit: 2024-08-22 | Discharge: 2024-08-22 | Disposition: A | Discharge status: Home / Self Care | Source: Ambulatory Visit | Attending: Physical Medicine and Rehabilitation | Admitting: Physical Medicine and Rehabilitation

## 2024-08-22 DIAGNOSIS — M47817 Spondylosis without myelopathy or radiculopathy, lumbosacral region: Secondary | ICD-10-CM | POA: Diagnosis not present

## 2024-08-22 DIAGNOSIS — M4316 Spondylolisthesis, lumbar region: Secondary | ICD-10-CM | POA: Diagnosis not present

## 2024-08-22 DIAGNOSIS — G8929 Other chronic pain: Secondary | ICD-10-CM

## 2024-08-24 DIAGNOSIS — H04123 Dry eye syndrome of bilateral lacrimal glands: Secondary | ICD-10-CM | POA: Diagnosis not present

## 2024-08-24 DIAGNOSIS — T8522XS Displacement of intraocular lens, sequela: Secondary | ICD-10-CM | POA: Diagnosis not present

## 2024-08-24 DIAGNOSIS — H35032 Hypertensive retinopathy, left eye: Secondary | ICD-10-CM | POA: Diagnosis not present

## 2024-08-24 DIAGNOSIS — H35373 Puckering of macula, bilateral: Secondary | ICD-10-CM | POA: Diagnosis not present

## 2024-08-29 ENCOUNTER — Ambulatory Visit: Admitting: Physical Medicine and Rehabilitation

## 2024-09-02 DIAGNOSIS — M8580 Other specified disorders of bone density and structure, unspecified site: Secondary | ICD-10-CM | POA: Diagnosis not present

## 2024-09-02 DIAGNOSIS — R7301 Impaired fasting glucose: Secondary | ICD-10-CM | POA: Diagnosis not present

## 2024-09-02 DIAGNOSIS — Z0189 Encounter for other specified special examinations: Secondary | ICD-10-CM | POA: Diagnosis not present

## 2024-09-02 DIAGNOSIS — E785 Hyperlipidemia, unspecified: Secondary | ICD-10-CM | POA: Diagnosis not present

## 2024-09-05 ENCOUNTER — Encounter: Payer: Self-pay | Admitting: Physical Medicine and Rehabilitation

## 2024-09-05 ENCOUNTER — Ambulatory Visit: Admitting: Physical Medicine and Rehabilitation

## 2024-09-05 DIAGNOSIS — G8929 Other chronic pain: Secondary | ICD-10-CM | POA: Diagnosis not present

## 2024-09-05 DIAGNOSIS — M47816 Spondylosis without myelopathy or radiculopathy, lumbar region: Secondary | ICD-10-CM

## 2024-09-05 DIAGNOSIS — M47817 Spondylosis without myelopathy or radiculopathy, lumbosacral region: Secondary | ICD-10-CM

## 2024-09-05 DIAGNOSIS — M545 Low back pain, unspecified: Secondary | ICD-10-CM | POA: Diagnosis not present

## 2024-09-05 NOTE — Progress Notes (Signed)
 Pain Scale   Average Pain 3 Patient advising she has lower back pain that comes and goes. Patient here for MRI review.        +Driver, -BT, -Dye Allergies.

## 2024-09-05 NOTE — Progress Notes (Signed)
 Linda [name] - 78 y.o. female MRN [id number]  Date of birth: 06-Mar-1946  Office Visit Note: Visit Date: 09/05/2024 PCP: Loreli Elsie JONETTA Mickey., MD Referred by: Loreli Elsie JONETTA Mickey., MD  Subjective: Chief Complaint  Patient presents with   Lower Back - Pain   HPI: Linda [name] is a 78 y.o. female who comes in today for evaluation of chronic, worsening and severe bilateral lower back pain, intermittent radiation of pain to hips and groin. Pain ongoing for several years, worsened after fall on boat in May.. Her pain worsens when moving from sitting to standing position. She describes pain as sore and aching sensation, currently rates as 3 out of 10. Some relief of pain with home exercise regimen, rest and use of medications. She recently started formal physical therapy ([facility]) with some relief of pain. Recent lumbar MRI imaging shows chronic L2 burst fracture with minimal retropulsion of fragments. No significant edema is identified. Mild grade 1 anterior spondylolisthesis of L3 on L4. There is multi level moderate facet arthropathy. No history of lumbar surgery/injections. Patient denies focal weakness, numbness and tingling. No recent trauma or falls.   Patients course is complicated by history of breast cancer, osteopenia and anxiety.       Review of Systems  Musculoskeletal:  Positive for back pain.  Neurological:  Negative for tingling, sensory change, focal weakness and weakness.  All other systems reviewed and are negative.  Otherwise per HPI.  Assessment & Plan: Visit Diagnoses:    ICD-10-CM   1. Chronic bilateral low back pain without sciatica  M54.50    G89.29     2. Spondylosis without myelopathy or radiculopathy, lumbosacral region  M47.817     3. Facet arthropathy, lumbar  M47.816        Plan: Findings:  Chronic, worsening and severe bilateral lower back pain, intermittent radiation of pain to hips and groin. Lower back is biggest pain generator.  Patient continues to have pain despite good conservative therapies such as formal physical therapy, home exercise regimen, rest and use of medications. I discussed recent lumbar MRI with patient today using imaging and spine model. Patients clinical presentation and exam are consistent with facet mediated pain. We discussed treatment plan in detail today including possibility of performing facet joint injections. We would recommend diagnostic and hopefully therapeutic bilateral L3-L4 and L4-L5 facet joint injections. Patient would like to hold on injections at this time. She plans to continue with formal physical therapy and possible dry needling. I encouraged her to let us  know if her pain increases. No red flag symptoms noted upon exam today.     Meds & Orders: No orders of the defined types were placed in this encounter.  No orders of the defined types were placed in this encounter.   Follow-up: Return if symptoms worsen or fail to improve.   Procedures: No procedures performed      Clinical History: MR LUMBAR SPINE WITHOUT IV CONTRAST   COMPARISON: X-ray 08/13/2024   CLINICAL HISTORY: Low back pain.   TECHNIQUE: SAG T2, SAG T1, SAG STIR, AX T2, AX T1 without IV contrast.   FINDINGS: There is mild rightward curvature of the lumbar spine. There is a likely chronic L2 burst fracture with minimal retropulsion of fragments. No significant edema is identified. Mild grade 1 anterior spondylolisthesis of L3 on 4 and to a lesser extent L4 on 5 secondary to facet arthrosis. No visualized acute fracture. The sacrum and SI joints are  unremarkable so far as visualized. Conus and cauda equina are unremarkable.   T12-L1: Disc desiccation. No focal protrusion, foraminal or spinal stenosis.   L1-2: Broad-based disc osteophyte effacing the ventral thecal sac. No significant spinal stenosis. Minimal retropulsion of fragments is identified. Mild facet arthrosis. There is slight caudal prominent  without impingement of the exiting L1 nerve.   L2-3: Broad-based disc osteophyte and mild facet arthrosis. There is no significant spinal or foraminal stenosis.   L3-4: Mild grade 1 anterior spondylolisthesis with a mild broad-based bulge and moderate facet arthrosis. There is crowding of the descending nerve roots in the lateral recess. There is mild caudal foraminal narrowing bilaterally. No impingement of the exiting L3 nerves.   L4-5: Broad-based disc osteophyte without significant foraminal or spinal stenosis. Moderate facet arthrosis is present.   L5-S1: Disc desiccation and moderate facet arthrosis. There is no significant spinal or foraminal stenosis.   The retroperitoneal structures demonstrate no significant abnormality.   IMPRESSION: Degenerative disc desiccation and mild grade 1 anterior spondylolisthesis of L3 on 4 and L4 on 5. There is no high-grade foraminal or spinal stenosis. There is mild crowding of the descending nerve roots in the lateral recess at L3-4. No definitive impingement.   Burst fracture of the L2 vertebral body with mild retropulsion of fragments. This is likely more chronic in nature. This is relatively stable when compared with the prior x-ray from 07/26/2024. No earlier studies are available.   Electronically signed by: Norleen Satchel MD 08/22/2024 01:27 PM EDT RP Workstation: [id number]   She reports that she quit smoking about 50 years ago. Her smoking use included cigarettes. She has never used smokeless tobacco. No results for input(s): HGBA1C, LABURIC in the last 8760 hours.  Objective:  VS:  HT:    WT:   BMI:     BP:   HR: bpm  TEMP: ( )  RESP:  Physical Exam Vitals and nursing note reviewed.  HENT:     Head: Normocephalic and atraumatic.     Right Ear: External ear normal.     Left Ear: External ear normal.     Nose: Nose normal.     Mouth/Throat:     Mouth: Mucous membranes are moist.  Eyes:     Extraocular Movements:  Extraocular movements intact.  Cardiovascular:     Rate and Rhythm: Normal rate.     Pulses: Normal pulses.  Pulmonary:     Effort: Pulmonary effort is normal.  Abdominal:     General: Abdomen is flat. There is distension.  Musculoskeletal:        General: Tenderness present.     Cervical back: Normal range of motion.     Comments: Patient rises from seated position to standing without difficulty. Pain noted with facet loading and lumbar extension. 5/5 strength noted with bilateral hip flexion, knee flexion/extension, ankle dorsiflexion/plantarflexion and EHL. No clonus noted bilaterally. No pain upon palpation of greater trochanters. No pain with internal/external rotation of bilateral hips. Sensation intact bilaterally. Negative slump test bilaterally. Ambulates without aid, gait steady.     Skin:    General: Skin is warm and dry.     Capillary Refill: Capillary refill takes less than 2 seconds.  Neurological:     General: No focal deficit present.     Mental Status: She is alert and oriented to person, place, and time.  Psychiatric:        Mood and Affect: Mood normal.        Behavior:  Behavior normal.     Ortho Exam  Imaging: No results found.  Past Medical/Family/Surgical/Social History: Medications & Allergies reviewed per EMR, new medications updated. 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   Lichen plano-pilaris 12/27/2015   Genetic testing 12/20/2014   Family history of breast cancer    Family history of prostate cancer    Family history of colon cancer    Palpitations 10/30/2014   Premature ventricular contraction 10/30/2014   Hyperlipidemia 10/30/2014   Osteopenia 07/10/2014   Malignant neoplasm of lower-inner quadrant of right female breast (HCC) 05/19/2014   Ductal carcinoma in situ (DCIS) of right breast 05/03/2014   Abnormal mammography 03/07/2014   ASCUS (atypical squamous cells of undetermined  significance) on Pap smear 02/16/2014   Unexplained endometrial cells on cervical cytology 02/16/2014   Atrophic vaginitis 01/03/2014   Constipation 01/03/2014   Insomnia 05/19/2013   Cervical dysplasia    Hypercholesteremia    ANXIETY 09/11/2009   ANAL FISSURE 09/11/2009   ARTHRITIS 09/11/2009   DIVERTICULOSIS, COLON 09/06/2009   RECTAL BLEEDING 09/06/2009   RECTAL PAIN 09/06/2009   History of colonic polyps 09/06/2009   Past Medical History:  Diagnosis Date   Anxiety    Arthritis    Atrophic vaginitis    Breast cancer (HCC)    right (DCIS) s/p lumpectomy 7/15   Cataract    BILATERAL   Cervical dysplasia 1975   DCIS (ductal carcinoma in situ)    RIGHT   Depression    Dysrhythmia    PVCs   Family history of adverse reaction to anesthesia    Pt stated that her mother had difficulty waking up after anesthesia   Family history of breast cancer    Family history of colon cancer    Family history of prostate cancer    Hx of radiation therapy 08/02/14- 08/24/14   right reast 4256 cGy i 16 sessions, no boost   Hypercholesteremia    Insomnia    Osteopenia 11/2011   T score -1.7 FRAX not calculated due to history of bisphosphonates and current Evista    Personal history of radiation therapy 2015   right side   PVC's (premature ventricular contractions)    PMH   Seasonal allergies    Skin cancer 2006   basal cell of face   Family History  Problem Relation Age of Onset   Breast cancer Mother        DIAGNOSED IN HER 80'S   Hyperlipidemia Mother    Osteoporosis Mother    Breast cancer Sister        DIAGNOSED AT AGE 19   Hypertension Maternal Grandmother    Breast cancer Maternal Grandmother        possibly breast cancer; had mastectomy in her 30s-40s, but don't know if it was due to cancer   Heart disease Maternal Grandfather    Stroke Maternal Grandfather    Diabetes Son        TYPE 1 DIABETES   Colon cancer Paternal Grandfather    Cancer Paternal Grandfather         colon   Heart failure Son    Kidney cancer Paternal Grandmother        bladder/kidney cancer   Prostate cancer Maternal Uncle    Past Surgical History:  Procedure Laterality Date   APPENDECTOMY  1955   BREAST BIOPSY  2010   rt bx   BREAST BIOPSY  2009   rt  BREAST EXCISIONAL BIOPSY Left 2018   ALH & COMPLEX SCLEROSING LESION   BREAST LUMPECTOMY  1983   BENIGN-rt   BREAST LUMPECTOMY Right 2015   dcis   BREAST LUMPECTOMY WITH RADIOACTIVE SEED AND SENTINEL LYMPH NODE BIOPSY Right 10/12/2020   Procedure: RIGHT BREAST LUMPECTOMY X 2 WITH RADIOACTIVE SEED AND SENTINEL LYMPH NODE BIOPSY;  Surgeon: Ebbie Cough, MD;  Location: [facility];  Service: General;  Laterality: Right;   BREAST LUMPECTOMY WITH RADIOACTIVE SEED LOCALIZATION Right 06/13/2014   Procedure: RIGHT BREAST  RADIOACTIVE SEED GUIDED LUMPECTOMY;  Surgeon: Cough Ebbie, MD;  Location: [facility];  Service: General;  Laterality: Right;for DCIS   CATARACT EXTRACTION     Both eyes   cataract surgery  2005   COLONOSCOPY     COLPOSCOPY     EXCISION OF BASAL CELL CA FROM [facility]  2006   FACELIFT  2014   FACIAL COSMETIC SURGERY     GYNECOLOGIC CRYOSURGERY     KNEE SURGERY  2013   Arthroscopic-rt   RADIOACTIVE SEED GUIDED EXCISIONAL BREAST BIOPSY Left 05/07/2017   Procedure: LEFT RADIOACTIVE SEED GUIDED EXCISIONAL BREAST BIOPSY;  Surgeon: Ebbie Cough, MD;  Location: [facility];  Service: General;  Laterality: Left;   REPOSITION OF LENS Right 10/01/2018   Procedure: REPOSITION OF LENS RIGHT EYE;  Surgeon: Tobie Baptist, MD;  Location: [facility] OR;  Service: Ophthalmology;  Laterality: Right;   Social History   Occupational History   Not on file  Tobacco Use   Smoking status: Former    Current packs/day: 0.00    Types: Cigarettes    Quit date: 05/19/1974    Years since quitting: 50.3   Smokeless tobacco: Never   Tobacco comments:    smoked in college  Vaping Use    Vaping status: Never Used  Substance and Sexual Activity   Alcohol  use: Yes    Alcohol /week: 10.0 standard drinks of alcohol     Types: 10 Standard drinks or equivalent per week    Comment: daily ETOH cocktail nightly   Drug use: No   Sexual activity: Not Currently    Birth control/protection: Post-menopausal    Comment: G4 P4

## 2024-09-09 DIAGNOSIS — G4700 Insomnia, unspecified: Secondary | ICD-10-CM | POA: Diagnosis not present

## 2024-09-09 DIAGNOSIS — Z23 Encounter for immunization: Secondary | ICD-10-CM | POA: Diagnosis not present

## 2024-09-09 DIAGNOSIS — Z1331 Encounter for screening for depression: Secondary | ICD-10-CM | POA: Diagnosis not present

## 2024-09-09 DIAGNOSIS — R635 Abnormal weight gain: Secondary | ICD-10-CM | POA: Diagnosis not present

## 2024-09-09 DIAGNOSIS — E785 Hyperlipidemia, unspecified: Secondary | ICD-10-CM | POA: Diagnosis not present

## 2024-09-09 DIAGNOSIS — N1831 Chronic kidney disease, stage 3a: Secondary | ICD-10-CM | POA: Diagnosis not present

## 2024-09-09 DIAGNOSIS — F1320 Sedative, hypnotic or anxiolytic dependence, uncomplicated: Secondary | ICD-10-CM | POA: Diagnosis not present

## 2024-09-09 DIAGNOSIS — Z0000 Encounter for general adult medical examination without abnormal findings: Secondary | ICD-10-CM | POA: Diagnosis not present

## 2024-09-09 DIAGNOSIS — G629 Polyneuropathy, unspecified: Secondary | ICD-10-CM | POA: Diagnosis not present

## 2024-09-09 DIAGNOSIS — R7301 Impaired fasting glucose: Secondary | ICD-10-CM | POA: Diagnosis not present

## 2024-09-09 DIAGNOSIS — F411 Generalized anxiety disorder: Secondary | ICD-10-CM | POA: Diagnosis not present

## 2024-09-09 DIAGNOSIS — R82998 Other abnormal findings in urine: Secondary | ICD-10-CM | POA: Diagnosis not present

## 2024-09-09 DIAGNOSIS — G3184 Mild cognitive impairment, so stated: Secondary | ICD-10-CM | POA: Diagnosis not present

## 2024-09-09 DIAGNOSIS — Z1339 Encounter for screening examination for other mental health and behavioral disorders: Secondary | ICD-10-CM | POA: Diagnosis not present

## 2024-09-09 DIAGNOSIS — C801 Malignant (primary) neoplasm, unspecified: Secondary | ICD-10-CM | POA: Diagnosis not present

## 2024-09-09 DIAGNOSIS — N39490 Overflow incontinence: Secondary | ICD-10-CM | POA: Diagnosis not present

## 2024-09-09 DIAGNOSIS — M8580 Other specified disorders of bone density and structure, unspecified site: Secondary | ICD-10-CM | POA: Diagnosis not present

## 2024-09-26 ENCOUNTER — Encounter: Payer: Self-pay | Admitting: Radiology

## 2024-10-10 DIAGNOSIS — F338 Other recurrent depressive disorders: Secondary | ICD-10-CM | POA: Diagnosis not present

## 2024-10-10 DIAGNOSIS — F419 Anxiety disorder, unspecified: Secondary | ICD-10-CM | POA: Diagnosis not present

## 2024-11-28 ENCOUNTER — Ambulatory Visit: Payer: PPO | Admitting: Hematology and Oncology

## 2025-01-24 ENCOUNTER — Ambulatory Visit: Admitting: Physician Assistant

## 2025-01-26 ENCOUNTER — Inpatient Hospital Stay: Payer: PPO | Attending: Hematology and Oncology | Admitting: Hematology and Oncology

## 2025-01-26 ENCOUNTER — Inpatient Hospital Stay: Payer: PPO
